# Patient Record
Sex: Female | Born: 1935 | Race: White | Hispanic: No | Smoking: Never smoker
Health system: Southern US, Community
[De-identification: ages and names within clinical notes are randomized; demographics above are authoritative.]

## PROBLEM LIST (undated history)

## (undated) DIAGNOSIS — E785 Hyperlipidemia, unspecified: Secondary | ICD-10-CM

## (undated) DIAGNOSIS — N186 End stage renal disease: Secondary | ICD-10-CM

## (undated) DIAGNOSIS — I10 Essential (primary) hypertension: Secondary | ICD-10-CM

## (undated) DIAGNOSIS — D649 Anemia, unspecified: Secondary | ICD-10-CM

## (undated) HISTORY — DX: Hyperlipidemia, unspecified: E78.5

## (undated) HISTORY — PX: BREAST SURGERY: SHX581

## (undated) HISTORY — PX: CHOLECYSTECTOMY: SHX55

## (undated) HISTORY — PX: TOTAL HIP ARTHROPLASTY: SHX124

## (undated) HISTORY — DX: Anemia, unspecified: D64.9

## (undated) HISTORY — DX: Essential (primary) hypertension: I10

## (undated) HISTORY — PX: APPENDECTOMY: SHX54

---

## 1998-05-01 ENCOUNTER — Encounter: Payer: Self-pay | Admitting: Internal Medicine

## 1998-05-01 ENCOUNTER — Ambulatory Visit (HOSPITAL_COMMUNITY): Admission: RE | Admit: 1998-05-01 | Discharge: 1998-05-01 | Payer: Self-pay | Admitting: Internal Medicine

## 1999-03-16 ENCOUNTER — Encounter: Admission: RE | Admit: 1999-03-16 | Discharge: 1999-03-16 | Payer: Self-pay | Admitting: Internal Medicine

## 1999-03-16 ENCOUNTER — Encounter: Payer: Self-pay | Admitting: Internal Medicine

## 1999-06-30 ENCOUNTER — Encounter: Payer: Self-pay | Admitting: Specialist

## 1999-06-30 ENCOUNTER — Ambulatory Visit (HOSPITAL_COMMUNITY): Admission: RE | Admit: 1999-06-30 | Discharge: 1999-06-30 | Payer: Self-pay | Admitting: Specialist

## 1999-07-14 ENCOUNTER — Encounter: Payer: Self-pay | Admitting: Specialist

## 1999-07-14 ENCOUNTER — Ambulatory Visit (HOSPITAL_COMMUNITY): Admission: RE | Admit: 1999-07-14 | Discharge: 1999-07-14 | Payer: Self-pay | Admitting: Specialist

## 1999-07-28 ENCOUNTER — Ambulatory Visit: Admission: RE | Admit: 1999-07-28 | Discharge: 1999-07-28 | Payer: Self-pay | Admitting: Specialist

## 1999-07-28 ENCOUNTER — Encounter: Payer: Self-pay | Admitting: Specialist

## 1999-09-01 ENCOUNTER — Other Ambulatory Visit: Admission: RE | Admit: 1999-09-01 | Discharge: 1999-09-01 | Payer: Self-pay | Admitting: Obstetrics and Gynecology

## 1999-09-09 ENCOUNTER — Encounter: Payer: Self-pay | Admitting: Specialist

## 1999-09-18 ENCOUNTER — Encounter: Payer: Self-pay | Admitting: Specialist

## 1999-09-18 ENCOUNTER — Inpatient Hospital Stay (HOSPITAL_COMMUNITY): Admission: RE | Admit: 1999-09-18 | Discharge: 1999-09-24 | Payer: Self-pay | Admitting: Specialist

## 2000-07-05 ENCOUNTER — Encounter: Payer: Self-pay | Admitting: Obstetrics and Gynecology

## 2000-07-05 ENCOUNTER — Encounter: Admission: RE | Admit: 2000-07-05 | Discharge: 2000-07-05 | Payer: Self-pay | Admitting: Obstetrics and Gynecology

## 2001-07-27 ENCOUNTER — Other Ambulatory Visit: Admission: RE | Admit: 2001-07-27 | Discharge: 2001-07-27 | Payer: Self-pay | Admitting: *Deleted

## 2002-07-05 ENCOUNTER — Encounter: Admission: RE | Admit: 2002-07-05 | Discharge: 2002-07-05 | Payer: Self-pay | Admitting: Internal Medicine

## 2002-07-05 ENCOUNTER — Encounter: Payer: Self-pay | Admitting: Internal Medicine

## 2002-10-03 ENCOUNTER — Other Ambulatory Visit: Admission: RE | Admit: 2002-10-03 | Discharge: 2002-10-03 | Payer: Self-pay | Admitting: Gynecology

## 2003-07-09 ENCOUNTER — Encounter: Admission: RE | Admit: 2003-07-09 | Discharge: 2003-07-09 | Payer: Self-pay | Admitting: Internal Medicine

## 2004-01-17 ENCOUNTER — Ambulatory Visit: Payer: Self-pay | Admitting: Internal Medicine

## 2004-12-25 ENCOUNTER — Ambulatory Visit: Payer: Self-pay | Admitting: Internal Medicine

## 2005-01-04 ENCOUNTER — Ambulatory Visit (HOSPITAL_COMMUNITY): Admission: RE | Admit: 2005-01-04 | Discharge: 2005-01-04 | Payer: Self-pay | Admitting: Internal Medicine

## 2005-05-17 ENCOUNTER — Ambulatory Visit: Payer: Self-pay | Admitting: Internal Medicine

## 2005-05-21 ENCOUNTER — Ambulatory Visit: Payer: Self-pay | Admitting: Internal Medicine

## 2005-05-26 ENCOUNTER — Ambulatory Visit: Payer: Self-pay | Admitting: Internal Medicine

## 2005-07-14 ENCOUNTER — Ambulatory Visit: Payer: Self-pay | Admitting: Internal Medicine

## 2005-07-30 ENCOUNTER — Ambulatory Visit: Payer: Self-pay | Admitting: Internal Medicine

## 2005-08-26 ENCOUNTER — Ambulatory Visit: Payer: Self-pay | Admitting: Internal Medicine

## 2005-09-07 ENCOUNTER — Ambulatory Visit: Payer: Self-pay | Admitting: Internal Medicine

## 2005-10-07 ENCOUNTER — Ambulatory Visit: Payer: Self-pay | Admitting: Internal Medicine

## 2005-10-28 ENCOUNTER — Ambulatory Visit: Payer: Self-pay | Admitting: Internal Medicine

## 2005-11-19 ENCOUNTER — Encounter: Payer: Self-pay | Admitting: Internal Medicine

## 2005-11-19 ENCOUNTER — Ambulatory Visit: Payer: Self-pay | Admitting: Internal Medicine

## 2005-11-19 DIAGNOSIS — E785 Hyperlipidemia, unspecified: Secondary | ICD-10-CM

## 2005-11-19 DIAGNOSIS — I10 Essential (primary) hypertension: Secondary | ICD-10-CM | POA: Insufficient documentation

## 2005-11-19 DIAGNOSIS — M109 Gout, unspecified: Secondary | ICD-10-CM | POA: Insufficient documentation

## 2005-11-19 LAB — CONVERTED CEMR LAB
Cholesterol, target level: 200 mg/dL
HDL goal, serum: 40 mg/dL
LDL Goal: 130 mg/dL

## 2005-12-20 ENCOUNTER — Ambulatory Visit: Payer: Self-pay | Admitting: Internal Medicine

## 2005-12-20 LAB — CONVERTED CEMR LAB
BUN: 13 mg/dL (ref 6–23)
CO2: 33 meq/L — ABNORMAL HIGH (ref 19–32)
Calcium: 9.5 mg/dL (ref 8.4–10.5)
Chloride: 107 meq/L (ref 96–112)
Chol/HDL Ratio, serum: 6.5
Cholesterol: 226 mg/dL (ref 0–200)
Creatinine, Ser: 1.1 mg/dL (ref 0.4–1.2)
GFR calc non Af Amer: 51 mL/min
Glomerular Filtration Rate, Af Am: 62 mL/min/{1.73_m2}
Glucose, Bld: 98 mg/dL (ref 70–99)
HDL: 34.8 mg/dL — ABNORMAL LOW (ref >39.0)
LDL DIRECT: 165.4 mg/dL
Potassium: 4.9 meq/L (ref 3.5–5.1)
Sodium: 144 meq/L (ref 135–145)
Triglyceride fasting, serum: 111 mg/dL (ref 0–149)
VLDL: 22 mg/dL (ref 0–40)

## 2005-12-27 ENCOUNTER — Ambulatory Visit: Payer: Self-pay | Admitting: Internal Medicine

## 2006-04-13 ENCOUNTER — Encounter: Admission: RE | Admit: 2006-04-13 | Discharge: 2006-04-13 | Payer: Self-pay | Admitting: Internal Medicine

## 2006-04-18 ENCOUNTER — Ambulatory Visit: Payer: Self-pay | Admitting: Internal Medicine

## 2006-04-18 LAB — CONVERTED CEMR LAB
ALT: 17 units/L (ref 0–40)
AST: 22 units/L (ref 0–37)
Albumin: 3 g/dL — ABNORMAL LOW (ref 3.5–5.2)
Alkaline Phosphatase: 76 units/L (ref 39–117)
BUN: 18 mg/dL (ref 6–23)
Basophils Absolute: 0 10*3/uL (ref 0.0–0.1)
Basophils Relative: 0.3 % (ref 0.0–1.0)
Bilirubin, Direct: 0.1 mg/dL (ref 0.0–0.3)
CO2: 33 meq/L — ABNORMAL HIGH (ref 19–32)
Calcium: 9.1 mg/dL (ref 8.4–10.5)
Chloride: 100 meq/L (ref 96–112)
Cholesterol: 207 mg/dL (ref 0–200)
Creatinine, Ser: 1 mg/dL (ref 0.4–1.2)
Direct LDL: 128.3 mg/dL
Eosinophils Absolute: 0.1 10*3/uL (ref 0.0–0.6)
Eosinophils Relative: 1.2 % (ref 0.0–5.0)
GFR calc Af Amer: 69 mL/min
GFR calc non Af Amer: 57 mL/min
Glucose, Bld: 98 mg/dL (ref 70–99)
HCT: 38.1 % (ref 36.0–46.0)
HDL: 29.8 mg/dL — ABNORMAL LOW (ref >39.0)
Hemoglobin: 13.1 g/dL (ref 12.0–15.0)
Lymphocytes Relative: 23.3 % (ref 12.0–46.0)
MCHC: 34.5 g/dL (ref 30.0–36.0)
MCV: 90.1 fL (ref 78.0–100.0)
Monocytes Absolute: 0.7 10*3/uL (ref 0.2–0.7)
Monocytes Relative: 6.2 % (ref 3.0–11.0)
Neutro Abs: 8.2 10*3/uL — ABNORMAL HIGH (ref 1.4–7.7)
Neutrophils Relative %: 69 % (ref 43.0–77.0)
Platelets: 311 10*3/uL (ref 150–400)
Potassium: 4.4 meq/L (ref 3.5–5.1)
RBC: 4.23 M/uL (ref 3.87–5.11)
RDW: 12.4 % (ref 11.5–14.6)
Sodium: 139 meq/L (ref 135–145)
Total Bilirubin: 0.6 mg/dL (ref 0.3–1.2)
Total CHOL/HDL Ratio: 6.9
Total Protein: 7.4 g/dL (ref 6.0–8.3)
Triglycerides: 288 mg/dL (ref 0–149)
VLDL: 58 mg/dL — ABNORMAL HIGH (ref 0–40)
WBC: 11.7 10*3/uL — ABNORMAL HIGH (ref 4.5–10.5)

## 2006-08-11 ENCOUNTER — Other Ambulatory Visit: Admission: RE | Admit: 2006-08-11 | Discharge: 2006-08-11 | Payer: Self-pay | Admitting: Obstetrics and Gynecology

## 2006-09-13 ENCOUNTER — Telehealth (INDEPENDENT_AMBULATORY_CARE_PROVIDER_SITE_OTHER): Payer: Self-pay | Admitting: *Deleted

## 2007-05-14 ENCOUNTER — Emergency Department (HOSPITAL_COMMUNITY): Admission: EM | Admit: 2007-05-14 | Discharge: 2007-05-15 | Payer: Self-pay | Admitting: Emergency Medicine

## 2007-07-11 ENCOUNTER — Ambulatory Visit: Payer: Self-pay | Admitting: Internal Medicine

## 2007-07-11 LAB — CONVERTED CEMR LAB
Bilirubin Urine: NEGATIVE
Glucose, Urine, Semiquant: NEGATIVE
Ketones, urine, test strip: NEGATIVE
Nitrite: POSITIVE
Specific Gravity, Urine: 1.025
Urobilinogen, UA: 0.2
pH: 6

## 2007-07-31 ENCOUNTER — Telehealth: Payer: Self-pay | Admitting: Internal Medicine

## 2007-09-05 ENCOUNTER — Ambulatory Visit: Payer: Self-pay | Admitting: Internal Medicine

## 2007-09-05 LAB — CONVERTED CEMR LAB
ALT: 15 units/L (ref 0–35)
AST: 20 units/L (ref 0–37)
Albumin: 3.4 g/dL — ABNORMAL LOW (ref 3.5–5.2)
Alkaline Phosphatase: 67 units/L (ref 39–117)
Bilirubin, Direct: 0.1 mg/dL (ref 0.0–0.3)
Cholesterol: 167 mg/dL (ref 0–200)
Direct LDL: 89.3 mg/dL
HDL: 28.1 mg/dL — ABNORMAL LOW (ref >39.0)
Total Bilirubin: 0.4 mg/dL (ref 0.3–1.2)
Total CHOL/HDL Ratio: 5.9
Total Protein: 6.8 g/dL (ref 6.0–8.3)
Triglycerides: 284 mg/dL (ref 0–149)
VLDL: 57 mg/dL — ABNORMAL HIGH (ref 0–40)

## 2007-09-12 ENCOUNTER — Ambulatory Visit: Payer: Self-pay | Admitting: Internal Medicine

## 2007-10-12 ENCOUNTER — Ambulatory Visit: Payer: Self-pay | Admitting: Internal Medicine

## 2008-01-09 ENCOUNTER — Ambulatory Visit: Payer: Self-pay | Admitting: Internal Medicine

## 2008-01-09 LAB — CONVERTED CEMR LAB
ALT: 11 units/L (ref 0–35)
AST: 18 units/L (ref 0–37)
Albumin: 3.3 g/dL — ABNORMAL LOW (ref 3.5–5.2)
Alkaline Phosphatase: 57 units/L (ref 39–117)
Bilirubin, Direct: 0.1 mg/dL (ref 0.0–0.3)
Cholesterol: 154 mg/dL (ref 0–200)
Direct LDL: 87.5 mg/dL
HDL: 26.3 mg/dL — ABNORMAL LOW (ref >39.0)
Total Bilirubin: 0.8 mg/dL (ref 0.3–1.2)
Total CHOL/HDL Ratio: 5.9
Total Protein: 6.7 g/dL (ref 6.0–8.3)
Triglycerides: 281 mg/dL (ref 0–149)
VLDL: 56 mg/dL — ABNORMAL HIGH (ref 0–40)

## 2008-01-26 ENCOUNTER — Ambulatory Visit: Payer: Self-pay | Admitting: Internal Medicine

## 2008-07-19 ENCOUNTER — Ambulatory Visit: Payer: Self-pay | Admitting: Internal Medicine

## 2008-07-19 LAB — CONVERTED CEMR LAB
ALT: 14 units/L (ref 0–35)
AST: 24 units/L (ref 0–37)
Albumin: 3.4 g/dL — ABNORMAL LOW (ref 3.5–5.2)
Alkaline Phosphatase: 62 units/L (ref 39–117)
BUN: 21 mg/dL (ref 6–23)
Bilirubin, Direct: 0 mg/dL (ref 0.0–0.3)
CO2: 29 meq/L (ref 19–32)
Calcium: 8.7 mg/dL (ref 8.4–10.5)
Chloride: 109 meq/L (ref 96–112)
Cholesterol: 149 mg/dL (ref 0–200)
Creatinine, Ser: 1.4 mg/dL — ABNORMAL HIGH (ref 0.4–1.2)
Direct LDL: 68.7 mg/dL
GFR calc non Af Amer: 38.32 mL/min (ref >60)
Glucose, Bld: 95 mg/dL (ref 70–99)
HDL: 32.3 mg/dL — ABNORMAL LOW (ref >39.00)
Potassium: 3.8 meq/L (ref 3.5–5.1)
Sodium: 140 meq/L (ref 135–145)
Total Bilirubin: 0.8 mg/dL (ref 0.3–1.2)
Total CHOL/HDL Ratio: 5
Total Protein: 7.4 g/dL (ref 6.0–8.3)
Triglycerides: 307 mg/dL — ABNORMAL HIGH (ref 0.0–149.0)
VLDL: 61.4 mg/dL — ABNORMAL HIGH (ref 0.0–40.0)

## 2008-07-29 ENCOUNTER — Ambulatory Visit: Payer: Self-pay | Admitting: Internal Medicine

## 2008-11-19 ENCOUNTER — Ambulatory Visit: Payer: Self-pay | Admitting: Internal Medicine

## 2008-11-19 LAB — CONVERTED CEMR LAB
ALT: 13 units/L (ref 0–35)
AST: 20 units/L (ref 0–37)
Albumin: 3.2 g/dL — ABNORMAL LOW (ref 3.5–5.2)
Alkaline Phosphatase: 57 units/L (ref 39–117)
BUN: 23 mg/dL (ref 6–23)
Bilirubin, Direct: 0.1 mg/dL (ref 0.0–0.3)
CO2: 26 meq/L (ref 19–32)
Calcium: 8.9 mg/dL (ref 8.4–10.5)
Chloride: 104 meq/L (ref 96–112)
Cholesterol: 151 mg/dL (ref 0–200)
Creatinine, Ser: 1.6 mg/dL — ABNORMAL HIGH (ref 0.4–1.2)
Direct LDL: 73 mg/dL
GFR calc non Af Amer: 32.82 mL/min (ref >60)
Glucose, Bld: 103 mg/dL — ABNORMAL HIGH (ref 70–99)
HDL: 30.5 mg/dL — ABNORMAL LOW (ref >39.00)
Potassium: 4.5 meq/L (ref 3.5–5.1)
Sodium: 140 meq/L (ref 135–145)
TSH: 2.5 microintl units/mL (ref 0.35–5.50)
Total Bilirubin: 0.6 mg/dL (ref 0.3–1.2)
Total CHOL/HDL Ratio: 5
Total Protein: 7.1 g/dL (ref 6.0–8.3)
Triglycerides: 250 mg/dL — ABNORMAL HIGH (ref 0.0–149.0)
VLDL: 50 mg/dL — ABNORMAL HIGH (ref 0.0–40.0)

## 2008-12-03 ENCOUNTER — Ambulatory Visit: Payer: Self-pay | Admitting: Internal Medicine

## 2009-01-09 ENCOUNTER — Ambulatory Visit: Payer: Self-pay | Admitting: Family Medicine

## 2009-01-09 ENCOUNTER — Ambulatory Visit: Payer: Self-pay | Admitting: Internal Medicine

## 2009-01-09 LAB — CONVERTED CEMR LAB
Bilirubin Urine: NEGATIVE
Glucose, Urine, Semiquant: NEGATIVE
Ketones, urine, test strip: NEGATIVE
Nitrite: NEGATIVE
Specific Gravity, Urine: 1.025
Urobilinogen, UA: 0.2
pH: 7

## 2009-01-10 ENCOUNTER — Encounter: Payer: Self-pay | Admitting: Family Medicine

## 2009-02-05 ENCOUNTER — Encounter: Admission: RE | Admit: 2009-02-05 | Discharge: 2009-02-05 | Payer: Self-pay | Admitting: Internal Medicine

## 2009-05-27 ENCOUNTER — Ambulatory Visit: Payer: Self-pay | Admitting: Internal Medicine

## 2009-05-27 LAB — CONVERTED CEMR LAB
ALT: 14 units/L (ref 0–35)
AST: 19 units/L (ref 0–37)
Albumin: 2.9 g/dL — ABNORMAL LOW (ref 3.5–5.2)
Alkaline Phosphatase: 56 units/L (ref 39–117)
BUN: 24 mg/dL — ABNORMAL HIGH (ref 6–23)
CO2: 27 meq/L (ref 19–32)
Calcium: 8.3 mg/dL — ABNORMAL LOW (ref 8.4–10.5)
Chloride: 108 meq/L (ref 96–112)
Cholesterol: 110 mg/dL (ref 0–200)
Creatinine, Ser: 1.9 mg/dL — ABNORMAL HIGH (ref 0.4–1.2)
Direct LDL: 48.6 mg/dL
GFR calc non Af Amer: 26.88 mL/min (ref >60)
Glucose, Bld: 94 mg/dL (ref 70–99)
HDL: 35.8 mg/dL — ABNORMAL LOW (ref >39.00)
Potassium: 3.8 meq/L (ref 3.5–5.1)
Sodium: 141 meq/L (ref 135–145)
Total Bilirubin: 0.3 mg/dL (ref 0.3–1.2)
Total CHOL/HDL Ratio: 3
Total Protein: 6.5 g/dL (ref 6.0–8.3)
Triglycerides: 227 mg/dL — ABNORMAL HIGH (ref 0.0–149.0)
VLDL: 45.4 mg/dL — ABNORMAL HIGH (ref 0.0–40.0)

## 2009-06-03 ENCOUNTER — Ambulatory Visit: Payer: Self-pay | Admitting: Internal Medicine

## 2009-06-03 DIAGNOSIS — F411 Generalized anxiety disorder: Secondary | ICD-10-CM | POA: Insufficient documentation

## 2009-11-03 ENCOUNTER — Telehealth: Payer: Self-pay | Admitting: Internal Medicine

## 2009-11-04 ENCOUNTER — Telehealth (INDEPENDENT_AMBULATORY_CARE_PROVIDER_SITE_OTHER): Payer: Self-pay | Admitting: *Deleted

## 2009-11-04 ENCOUNTER — Ambulatory Visit: Payer: Self-pay | Admitting: Family Medicine

## 2009-12-02 ENCOUNTER — Ambulatory Visit: Payer: Self-pay | Admitting: Internal Medicine

## 2009-12-04 LAB — CONVERTED CEMR LAB
ALT: 11 units/L (ref 0–35)
AST: 18 units/L (ref 0–37)
Albumin: 3.1 g/dL — ABNORMAL LOW (ref 3.5–5.2)
Alkaline Phosphatase: 62 units/L (ref 39–117)
BUN: 34 mg/dL — ABNORMAL HIGH (ref 6–23)
Bilirubin, Direct: 0 mg/dL (ref 0.0–0.3)
CO2: 28 meq/L (ref 19–32)
Calcium: 8.4 mg/dL (ref 8.4–10.5)
Chloride: 105 meq/L (ref 96–112)
Cholesterol: 161 mg/dL (ref 0–200)
Creatinine, Ser: 2.1 mg/dL — ABNORMAL HIGH (ref 0.4–1.2)
Direct LDL: 84 mg/dL
GFR calc non Af Amer: 23.92 mL/min (ref >60)
Glucose, Bld: 90 mg/dL (ref 70–99)
HDL: 30.6 mg/dL — ABNORMAL LOW (ref >39.00)
Potassium: 4.3 meq/L (ref 3.5–5.1)
Sodium: 137 meq/L (ref 135–145)
TSH: 3.5 microintl units/mL (ref 0.35–5.50)
Total Bilirubin: 0.3 mg/dL (ref 0.3–1.2)
Total CHOL/HDL Ratio: 5
Total Protein: 6.4 g/dL (ref 6.0–8.3)
Triglycerides: 258 mg/dL — ABNORMAL HIGH (ref 0.0–149.0)
VLDL: 51.6 mg/dL — ABNORMAL HIGH (ref 0.0–40.0)

## 2009-12-06 ENCOUNTER — Inpatient Hospital Stay (HOSPITAL_COMMUNITY): Admission: EM | Admit: 2009-12-06 | Discharge: 2009-12-06 | Payer: Self-pay | Admitting: Emergency Medicine

## 2010-03-02 ENCOUNTER — Encounter: Payer: Self-pay | Admitting: Internal Medicine

## 2010-03-02 ENCOUNTER — Other Ambulatory Visit: Payer: Self-pay | Admitting: Internal Medicine

## 2010-03-02 DIAGNOSIS — Z1231 Encounter for screening mammogram for malignant neoplasm of breast: Secondary | ICD-10-CM

## 2010-03-02 DIAGNOSIS — Z1239 Encounter for other screening for malignant neoplasm of breast: Secondary | ICD-10-CM

## 2010-03-10 NOTE — Progress Notes (Signed)
Summary: Fill Amoxocillin  Phone Note From Pharmacy   Summary of Call: Called Pharmacy back verfied with Dr. Clent Ridges to only fill Amoxcillin Initial call taken by: Kathrynn Speed CMA,  November 04, 2009 11:09 AM

## 2010-03-10 NOTE — Assessment & Plan Note (Signed)
Summary: 6 month rov/njr   Vital Signs:  Patient profile:   77 year old female Weight:      140 pounds Temp:     98.3 degrees F oral Pulse rate:   64 / minute BP sitting:   168 / 70  (left arm) Cuff size:   regular  Vitals Entered By: Alfred Levins, CMA (December 02, 2009 9:27 AM)  Serial Vital Signs/Assessments:  Time      Position  BP       Pulse  Resp  Temp     By                     130/80                         Birdie Sons MD  CC: f/u on bp   CC:  f/u on bp.  History of Present Illness:  Follow-Up Visit      This is an 78 year old woman who presents for Follow-up visit.  The patient denies chest pain and palpitations.  Since the last visit the patient notes no new problems or concerns.  The patient reports taking meds as prescribed.  When questioned about possible medication side effects, the patient notes none.  she does not monitor home BPs  Current Problems (verified): 1)  Anxiety State Nos  (ICD-300.00) 2)  Hypertension  (ICD-401.9) 3)  Hyperlipidemia  (ICD-272.4) 4)  Gout  (ICD-274.9)  Current Medications (verified): 1)  Norvasc 5 Mg Tabs (Amlodipine Besylate) .Marland Kitchen.. 1 By Mouth Qd 2)  Atenolol 25 Mg  Tabs (Atenolol) .... Take 1 Tablet By Mouth Two Times A Day 3)  Aspir-Low 81 Mg Tbec (Aspirin) .Marland Kitchen.. 1 By Mouth Qd 4)  Alprazolam 0.25 Mg Tabs (Alprazolam) .... Take 1 Tablet By Mouth Once A Day As Needed Anxiety 5)  Crestor 20 Mg Tabs (Rosuvastatin Calcium) .... Take 1 Tablet By Mouth Once A Day or As Directed 6)  Centrum Silver  Tabs (Multiple Vitamins-Minerals) .... Once Daily  Allergies (verified): No Known Drug Allergies  Physical Exam  General:  alert and well-developed.   Head:  normocephalic and atraumatic.   Eyes:  pupils equal and pupils round.   Ears:  R ear normal and L ear normal.   Neck:  several small tender anterior nodes below the left ear  Lungs:  Normal respiratory effort, chest expands symmetrically. Lungs are clear to auscultation, no  crackles or wheezes. Heart:  normal rate and regular rhythm.   Abdomen:  soft and non-tender.   Msk:  No deformity or scoliosis noted of thoracic or lumbar spine.   Neurologic:  cranial nerves II-XII intact and gait normal.   Skin:  turgor normal and color normal.   Psych:  good eye contact and not anxious appearing.     Impression & Recommendations:  Problem # 1:  HYPERTENSION (ICD-401.9)  repeat bp 130/80 she does comment about night cramps  will try magnesium oxide Her updated medication list for this problem includes:    Norvasc 5 Mg Tabs (Amlodipine besylate) .Marland Kitchen... 1 by mouth qd    Atenolol 25 Mg Tabs (Atenolol) .Marland Kitchen... Take 1 tablet by mouth two times a day  Orders: Venipuncture (16109) TLB-BMP (Basic Metabolic Panel-BMET) (80048-METABOL)  Problem # 2:  HYPERLIPIDEMIA (ICD-272.4)  check labs today Her updated medication list for this problem includes:    Crestor 20 Mg Tabs (Rosuvastatin calcium) .Marland Kitchen... Take 1 tablet by mouth  once a day or as directed  Labs Reviewed: SGOT: 19 (05/27/2009)   SGPT: 14 (05/27/2009)  Lipid Goals: Chol Goal: 200 (11/19/2005)   HDL Goal: 40 (11/19/2005)   LDL Goal: 130 (11/19/2005)   TG Goal: 150 (11/19/2005)  Prior 10 Yr Risk Heart Disease: Not enough information (11/19/2005)   HDL:35.80 (05/27/2009), 30.50 (11/19/2008)  LDL:DEL (01/09/2008), DEL (09/05/2007)  Chol:110 (05/27/2009), 151 (11/19/2008)  Trig:227.0 (05/27/2009), 250.0 (11/19/2008)  Orders: TLB-Lipid Panel (80061-LIPID) TLB-Hepatic/Liver Function Pnl (80076-HEPATIC) TLB-TSH (Thyroid Stimulating Hormone) (84443-TSH)  Problem # 3:  ANXIETY STATE NOS (ICD-300.00) ok to use xanax i'd like her to use less but she states "I have to take one daily" Her updated medication list for this problem includes:    Alprazolam 0.25 Mg Tabs (Alprazolam) .Marland Kitchen... Take 1 tablet by mouth once a day as needed anxiety  Complete Medication List: 1)  Norvasc 5 Mg Tabs (Amlodipine besylate) .Marland Kitchen.. 1 by  mouth qd 2)  Atenolol 25 Mg Tabs (Atenolol) .... Take 1 tablet by mouth two times a day 3)  Aspir-low 81 Mg Tbec (Aspirin) .Marland Kitchen.. 1 by mouth qd 4)  Alprazolam 0.25 Mg Tabs (Alprazolam) .... Take 1 tablet by mouth once a day as needed anxiety 5)  Crestor 20 Mg Tabs (Rosuvastatin calcium) .... Take 1 tablet by mouth once a day or as directed 6)  Centrum Silver Tabs (Multiple vitamins-minerals) .... Once daily 7)  Magnesium Oxide 400 Mg Tabs (Magnesium oxide) .Marland Kitchen.. 1 by mouth once daily ---may help with leg cramps Prescriptions: NORVASC 5 MG TABS (AMLODIPINE BESYLATE) 1 by mouth qd  #90 x 3   Entered and Authorized by:   Birdie Sons MD   Signed by:   Birdie Sons MD on 12/02/2009   Method used:   Electronically to        [facility] [phone]* (retail)       [address]       [place], [place]  [address]       Ph: [phone] or [phone]       Fax: [phone]   RxID:   [id number]    Orders Added: 1)  Est. Patient Level IV [25956] 2)  Venipuncture [38756] 3)  TLB-BMP (Basic Metabolic Panel-BMET) [80048-METABOL] 4)  TLB-Lipid Panel [80061-LIPID] 5)  TLB-Hepatic/Liver Function Pnl [80076-HEPATIC] 6)  TLB-TSH (Thyroid Stimulating Hormone) [43329-JJO]  Appended Document: Orders Update     Clinical Lists Changes  Orders: Added new Service order of Specimen Handling (84166) - Signed

## 2010-03-10 NOTE — Progress Notes (Signed)
Summary: REQ FOR RX  Phone Note Call from Patient   Caller: Patient Summary of Call: Pt called in to adv that she has had an earache x 3 days.... Pt believes that she has an ear infection and would like to have Rx (abx) sent into [facility] [place]...Marland KitchenMarland KitchenPt was offered OV with physician today but declined req that Rx be sent into pharmacy for abx.... Pt can be reached at [phone].  Initial call taken by: Debbra Riding,  November 03, 2009 9:11 AM  Follow-up for Phone Call        probably needs OV Follow-up by: Birdie Sons MD,  November 03, 2009 12:03 PM  Additional Follow-up for Phone Call Additional follow up Details #1::        Pt is going to come in for OV tomorrow with Dr Clent Ridges (Dr Cato Mulligan schedule full).  Additional Follow-up by: Debbra Riding,  November 03, 2009 3:18 PM

## 2010-03-10 NOTE — Assessment & Plan Note (Signed)
Summary: EARACHE // RS   Vital Signs:  Patient profile:   77 year old female Weight:      143 pounds Temp:     98.6 degrees F oral BP sitting:   170 / 62  (left arm) Cuff size:   regular  Vitals Entered By: Kathrynn Speed CMA (November 04, 2009 9:21 AM) CC: Earache x 3 days, src Is Patient Diabetic? No   History of Present Illness: Here for 3 days of pain in the left ear and the left neck beneath the ear. No HA or fever or ST.   Preventive Screening-Counseling & Management  Alcohol-Tobacco     Smoking Status: never  Current Medications (verified): 1)  Norvasc 5 Mg Tabs (Amlodipine Besylate) .Marland Kitchen.. 1 By Mouth Qd 2)  Atenolol 25 Mg  Tabs (Atenolol) .... Take 1 Tablet By Mouth Two Times A Day 3)  Aspir-Low 81 Mg Tbec (Aspirin) .Marland Kitchen.. 1 By Mouth Qd 4)  Vitamin D3 1000 Unit Tabs (Cholecalciferol) .... Once Daily 5)  Alprazolam 0.25 Mg Tabs (Alprazolam) .... Take 1 Tablet By Mouth Once A Day As Needed Anxiety 6)  Co Q-10 300 Mg  Caps (Coenzyme Q10) .... Once Daily--Takes Sometimes 7)  Crestor 20 Mg Tabs (Rosuvastatin Calcium) .... Take 1 Tablet By Mouth Once A Day or As Directed 8)  Eq Pain Reliever Ex St 500 Mg Caps (Acetaminophen) .... As Needed  Allergies (verified): No Known Drug Allergies  Past History:  Past Medical History: Reviewed history from 11/19/2005 and no changes required. Gout Hyperlipidemia Hypertension  Past Surgical History: Reviewed history from 11/19/2005 and no changes required. Appendectomy Cholecystectomy Total hip replacement benign breast biopsy  Review of Systems  The patient denies anorexia, fever, weight loss, weight gain, vision loss, decreased hearing, hoarseness, chest pain, syncope, dyspnea on exertion, peripheral edema, prolonged cough, headaches, hemoptysis, abdominal pain, melena, hematochezia, severe indigestion/heartburn, hematuria, incontinence, genital sores, muscle weakness, suspicious skin lesions, transient blindness, difficulty  walking, depression, unusual weight change, abnormal bleeding, enlarged lymph nodes, angioedema, breast masses, and testicular masses.    Physical Exam  General:  Well-developed,well-nourished,in no acute distress; alert,appropriate and cooperative throughout examination Head:  Normocephalic and atraumatic without obvious abnormalities. No apparent alopecia or balding. Eyes:  No corneal or conjunctival inflammation noted. EOMI. Perrla. Funduscopic exam benign, without hemorrhages, exudates or papilledema. Vision grossly normal. Ears:  External ear exam shows no significant lesions or deformities.  Otoscopic examination reveals clear canals, tympanic membranes are intact bilaterally without bulging, retraction, inflammation or discharge. Hearing is grossly normal bilaterally. She is tender at the bottom of the left earlobe.  Nose:  External nasal examination shows no deformity or inflammation. Nasal mucosa are pink and moist without lesions or exudates. Mouth:  Oral mucosa and oropharynx without lesions or exudates.  Teeth in good repair. Neck:  several small tender anterior nodes below the left ear  Lungs:  Normal respiratory effort, chest expands symmetrically. Lungs are clear to auscultation, no crackles or wheezes.   Impression & Recommendations:  Problem # 1:  CELLULITIS, FACE (ICD-682.0)  The following medications were removed from the medication list:    Augmentin 875-125 Mg Tabs (Amoxicillin-pot clavulanate) .Marland Kitchen..Marland Kitchen Two times a day Her updated medication list for this problem includes:    Amoxicillin 875 Mg Tabs (Amoxicillin) .Marland Kitchen..Marland Kitchen Two times a day  Complete Medication List: 1)  Norvasc 5 Mg Tabs (Amlodipine besylate) .Marland Kitchen.. 1 by mouth qd 2)  Atenolol 25 Mg Tabs (Atenolol) .... Take 1 tablet by mouth two times  a day 3)  Aspir-low 81 Mg Tbec (Aspirin) .Marland Kitchen.. 1 by mouth qd 4)  Vitamin D3 1000 Unit Tabs (Cholecalciferol) .... Once daily 5)  Alprazolam 0.25 Mg Tabs (Alprazolam) .... Take 1  tablet by mouth once a day as needed anxiety 6)  Co Q-10 300 Mg Caps (Coenzyme q10) .... Once daily--takes sometimes 7)  Crestor 20 Mg Tabs (Rosuvastatin calcium) .... Take 1 tablet by mouth once a day or as directed 8)  Eq Pain Reliever Ex St 500 Mg Caps (Acetaminophen) .... As needed 9)  Amoxicillin 875 Mg Tabs (Amoxicillin) .... Two times a day  Patient Instructions: 1)  Use warm compresses as needed . Stop wearing earrings for 2 weeks  Prescriptions: AMOXICILLIN 875 MG TABS (AMOXICILLIN) two times a day  #20 x 0   Entered and Authorized by:   Nelwyn Salisbury MD   Signed by:   Nelwyn Salisbury MD on 11/04/2009   Method used:   Electronically to        [facility] [phone]* (retail)       [address]       [place], [place]  [address]       Ph: [phone] or [phone]       Fax: [phone]   RxID:   [id number] AUGMENTIN 875-125 MG TABS (AMOXICILLIN-POT CLAVULANATE) two times a day  #20 x 0   Entered and Authorized by:   Nelwyn Salisbury MD   Signed by:   Nelwyn Salisbury MD on 11/04/2009   Method used:   Electronically to        [facility] [phone]* (retail)       [address]       [place], [place]  [address]       Ph: [phone] or [phone]       Fax: [phone]   RxID:   [id number]

## 2010-03-10 NOTE — Assessment & Plan Note (Signed)
Summary: ROA X 6 MTHS / RS   Vital Signs:  Patient profile:   77 year old female Weight:      144 pounds BMI:     22.64 Temp:     98.8 degrees F oral Pulse rate:   62 / minute Pulse rhythm:   regular Resp:     12 per minute BP sitting:   146 / 68  (left arm) Cuff size:   regular  Vitals Entered By: Gladis Riffle, RN (June 03, 2009 9:14 AM) CC: 6 month rov, labs done Is Patient Diabetic? No   CC:  6 month rov and labs done.  History of Present Illness:  Follow-Up Visit      This is an 77 year old woman who presents for Follow-up visit.  The patient denies chest pain and palpitations.  Since the last visit the patient notes no new problems or concerns.  The patient reports taking meds as prescribed.  When questioned about possible medication side effects, the patient notes none.    All other systems reviewed and were negative   Preventive Screening-Counseling & Management  Alcohol-Tobacco     Smoking Status: never  Current Problems (verified): 1)  Hypertension  (ICD-401.9) 2)  Hyperlipidemia  (ICD-272.4) 3)  Gout  (ICD-274.9)  Current Medications (verified): 1)  Norvasc 5 Mg Tabs (Amlodipine Besylate) .Marland Kitchen.. 1 By Mouth Qd 2)  Atenolol 25 Mg  Tabs (Atenolol) .... Take 1 Tablet By Mouth Two Times A Day 3)  Aspir-Low 81 Mg Tbec (Aspirin) .Marland Kitchen.. 1 By Mouth Qd 4)  Vitamin D3 1000 Unit Tabs (Cholecalciferol) .... Once Daily 5)  Alprazolam 0.25 Mg Tabs (Alprazolam) .... Take One Tablet By Mouth Every Other Day As Needed 6)  Co Q-10 300 Mg  Caps (Coenzyme Q10) .... Once Daily--Takes Sometimes 7)  Crestor 20 Mg Tabs (Rosuvastatin Calcium) .... Take 1 Tablet By Mouth Once A Day or As Directed 8)  Eq Pain Reliever Ex St 500 Mg Caps (Acetaminophen) .... As Needed  Allergies (verified): No Known Drug Allergies  Past History:  Past Medical History: Last updated: 12-17-05 Gout Hyperlipidemia Hypertension  Past Surgical History: Last updated:  2005/12/17 Appendectomy Cholecystectomy Total hip replacement benign breast biopsy  Family History: Last updated: Dec 17, 2005 father deceased MI age  Family History of CAD Female 1st degree relative <50 his health is really unknown  Social History: Last updated: 07/29/2008 Married x 2---widowed  (2010) Never Smoked Alcohol use-no  Risk Factors: Smoking Status: never (06/03/2009)  Physical Exam  General:  Well-developed,well-nourished,in no acute distress; alert,appropriate and cooperative throughout examination Head:  normocephalic and atraumatic.   Eyes:  pupils equal and pupils round.   Ears:  R ear normal and L ear normal.   Neck:  No deformities, masses, or tenderness noted. Chest Wall:  No deformities, masses, or tenderness noted. Lungs:  normal respiratory effort and no intercostal retractions.   Heart:  normal rate and regular rhythm.   Abdomen:  Bowel sounds positive,abdomen soft and non-tender without masses, organomegaly or hernias noted. Msk:  No deformity or scoliosis noted of thoracic or lumbar spine.   Neurologic:  cranial nerves II-XII intact and gait normal.     Impression & Recommendations:  Problem # 1:  HYPERLIPIDEMIA (ICD-272.4)  complains of cost note results---decrease to 1/2 dose (she will cut 20s in 1/2) Her updated medication list for this problem includes:    Crestor 20 Mg Tabs (Rosuvastatin calcium) .Marland Kitchen... Take 1 tablet by mouth once a day or as  directed  Labs Reviewed: SGOT: 19 (05/27/2009)   SGPT: 14 (05/27/2009)  Lipid Goals: Chol Goal: 200 (11/19/2005)   HDL Goal: 40 (11/19/2005)   LDL Goal: 130 (11/19/2005)   TG Goal: 150 (11/19/2005)  Prior 10 Yr Risk Heart Disease: Not enough information (11/19/2005)   HDL:35.80 (05/27/2009), 30.50 (11/19/2008)  LDL:DEL (01/09/2008), DEL (09/05/2007)  Chol:110 (05/27/2009), 151 (11/19/2008)  Trig:227.0 (05/27/2009), 250.0 (11/19/2008)  Problem # 2:  HYPERTENSION (ICD-401.9) reasonable control she  will start monitoring at home---call me if bp > 140/80 Her updated medication list for this problem includes:    Norvasc 5 Mg Tabs (Amlodipine besylate) .Marland Kitchen... 1 by mouth qd    Atenolol 25 Mg Tabs (Atenolol) .Marland Kitchen... Take 1 tablet by mouth two times a day  BP today: 146/68 Prior BP: 156/68 (01/09/2009)  Prior 10 Yr Risk Heart Disease: Not enough information (11/19/2005)  Labs Reviewed: K+: 3.8 (05/27/2009) Creat: : 1.9 (05/27/2009)   Chol: 110 (05/27/2009)   HDL: 35.80 (05/27/2009)   LDL: DEL (01/09/2008)   TG: 227.0 (05/27/2009)  Problem # 3:  GOUT (ICD-274.9) no recurrence  Problem # 4:  ANXIETY STATE NOS (ICD-300.00) she takes one every day side effects discussed Her updated medication list for this problem includes:    Alprazolam 0.25 Mg Tabs (Alprazolam) .Marland Kitchen... Take 1 tablet by mouth once a day as needed anxiety  Complete Medication List: 1)  Norvasc 5 Mg Tabs (Amlodipine besylate) .Marland Kitchen.. 1 by mouth qd 2)  Atenolol 25 Mg Tabs (Atenolol) .... Take 1 tablet by mouth two times a day 3)  Aspir-low 81 Mg Tbec (Aspirin) .Marland Kitchen.. 1 by mouth qd 4)  Vitamin D3 1000 Unit Tabs (Cholecalciferol) .... Once daily 5)  Alprazolam 0.25 Mg Tabs (Alprazolam) .... Take 1 tablet by mouth once a day as needed anxiety 6)  Co Q-10 300 Mg Caps (Coenzyme q10) .... Once daily--takes sometimes 7)  Crestor 20 Mg Tabs (Rosuvastatin calcium) .... Take 1 tablet by mouth once a day or as directed 8)  Eq Pain Reliever Ex St 500 Mg Caps (Acetaminophen) .... As needed  Patient Instructions: 1)  Please schedule a follow-up appointment in 6 months. Prescriptions: ALPRAZOLAM 0.25 MG TABS (ALPRAZOLAM) Take 1 tablet by mouth once a day as needed anxiety  #90 x 1   Entered and Authorized by:   Birdie Sons MD   Signed by:   Birdie Sons MD on 06/03/2009   Method used:   Print then Give to Patient   RxID:   [id number]      Immunization History:  Tetanus/Td Immunization History:    Tetanus/Td:  declines  (06/03/2009)  Pneumovax Immunization History:    Pneumovax:  declines (06/03/2009)

## 2010-03-18 ENCOUNTER — Ambulatory Visit
Admission: RE | Admit: 2010-03-18 | Discharge: 2010-03-18 | Disposition: A | Discharge status: Home / Self Care | Payer: MEDICARE | Source: Ambulatory Visit | Attending: Internal Medicine | Admitting: Internal Medicine

## 2010-03-18 DIAGNOSIS — Z1231 Encounter for screening mammogram for malignant neoplasm of breast: Secondary | ICD-10-CM

## 2010-03-25 ENCOUNTER — Telehealth: Payer: Self-pay | Admitting: Internal Medicine

## 2010-03-25 DIAGNOSIS — F419 Anxiety disorder, unspecified: Secondary | ICD-10-CM

## 2010-03-25 MED ORDER — ALPRAZOLAM 0.25 MG PO TABS: 0.2500 mg | ORAL_TABLET | Freq: Every day | ORAL | Status: DC | PRN

## 2010-03-25 NOTE — Telephone Encounter (Signed)
Request from pharmacy for refill of xanax, rx faxed in

## 2010-04-22 LAB — BASIC METABOLIC PANEL
BUN: 25 mg/dL — ABNORMAL HIGH (ref 6–23)
CO2: 23 mEq/L (ref 19–32)
Calcium: 8.4 mg/dL (ref 8.4–10.5)
Chloride: 106 mEq/L (ref 96–112)
Creatinine, Ser: 2.09 mg/dL — ABNORMAL HIGH (ref 0.4–1.2)
GFR calc Af Amer: 27 mL/min — ABNORMAL LOW (ref >60)
GFR calc non Af Amer: 23 mL/min — ABNORMAL LOW (ref >60)
Glucose, Bld: 135 mg/dL — ABNORMAL HIGH (ref 70–99)
Potassium: 4 mEq/L (ref 3.5–5.1)
Sodium: 138 mEq/L (ref 135–145)

## 2010-04-22 LAB — DIFFERENTIAL
Basophils Absolute: 0 10*3/uL (ref 0.0–0.1)
Basophils Relative: 0 % (ref 0–1)
Eosinophils Absolute: 0.1 10*3/uL (ref 0.0–0.7)
Eosinophils Relative: 1 % (ref 0–5)
Lymphocytes Relative: 16 % (ref 12–46)
Lymphs Abs: 2.2 10*3/uL (ref 0.7–4.0)
Monocytes Absolute: 0.4 10*3/uL (ref 0.1–1.0)
Monocytes Relative: 3 % (ref 3–12)
Neutro Abs: 11 10*3/uL — ABNORMAL HIGH (ref 1.7–7.7)
Neutrophils Relative %: 80 % — ABNORMAL HIGH (ref 43–77)

## 2010-04-22 LAB — CBC
HCT: 29.3 % — ABNORMAL LOW (ref 36.0–46.0)
Hemoglobin: 9.9 g/dL — ABNORMAL LOW (ref 12.0–15.0)
MCH: 30.7 pg (ref 26.0–34.0)
MCHC: 33.8 g/dL (ref 30.0–36.0)
MCV: 90.7 fL (ref 78.0–100.0)
Platelets: 282 10*3/uL (ref 150–400)
RBC: 3.23 MIL/uL — ABNORMAL LOW (ref 3.87–5.11)
RDW: 12.6 % (ref 11.5–15.5)
WBC: 13.8 10*3/uL — ABNORMAL HIGH (ref 4.0–10.5)

## 2010-05-19 ENCOUNTER — Other Ambulatory Visit: Payer: Self-pay | Admitting: Internal Medicine

## 2010-05-28 ENCOUNTER — Encounter: Payer: Self-pay | Admitting: Internal Medicine

## 2010-05-29 ENCOUNTER — Ambulatory Visit (INDEPENDENT_AMBULATORY_CARE_PROVIDER_SITE_OTHER): Payer: MEDICARE | Admitting: Internal Medicine

## 2010-05-29 ENCOUNTER — Encounter: Payer: Self-pay | Admitting: Internal Medicine

## 2010-05-29 VITALS — BP 188/80 | HR 80 | Temp 98.9°F | Wt 139.0 lb

## 2010-05-29 DIAGNOSIS — E785 Hyperlipidemia, unspecified: Secondary | ICD-10-CM

## 2010-05-29 DIAGNOSIS — F411 Generalized anxiety disorder: Secondary | ICD-10-CM

## 2010-05-29 DIAGNOSIS — I10 Essential (primary) hypertension: Secondary | ICD-10-CM

## 2010-05-29 LAB — TSH: TSH: 3.72 u[IU]/mL (ref 0.35–5.50)

## 2010-05-29 LAB — LIPID PANEL
HDL: 35.2 mg/dL — ABNORMAL LOW (ref >39.00)
Triglycerides: 157 mg/dL — ABNORMAL HIGH (ref 0.0–149.0)
VLDL: 31.4 mg/dL (ref 0.0–40.0)

## 2010-05-29 LAB — HEPATIC FUNCTION PANEL
ALT: 12 U/L (ref 0–35)
AST: 16 U/L (ref 0–37)
Total Bilirubin: 0.4 mg/dL (ref 0.3–1.2)
Total Protein: 6.3 g/dL (ref 6.0–8.3)

## 2010-05-29 LAB — BASIC METABOLIC PANEL
BUN: 34 mg/dL — ABNORMAL HIGH (ref 6–23)
CO2: 25 mEq/L (ref 19–32)
Chloride: 107 mEq/L (ref 96–112)
Creatinine, Ser: 3.1 mg/dL — ABNORMAL HIGH (ref 0.4–1.2)
Glucose, Bld: 82 mg/dL (ref 70–99)

## 2010-05-29 MED ORDER — ATENOLOL 50 MG PO TABS: 50.0000 mg | ORAL_TABLET | Freq: Two times a day (BID) | ORAL | Status: DC

## 2010-05-29 MED ORDER — AMLODIPINE BESYLATE 10 MG PO TABS: 10.0000 mg | ORAL_TABLET | Freq: Every day | ORAL | Status: DC

## 2010-05-29 MED ORDER — ATENOLOL 25 MG PO TABS: 25.0000 mg | ORAL_TABLET | Freq: Two times a day (BID) | ORAL | Status: DC

## 2010-05-29 NOTE — Assessment & Plan Note (Signed)
Not controlled See new mediction list---will increase meds Side effects discussed

## 2010-05-29 NOTE — Progress Notes (Signed)
  Subjective:    Patient ID: Christine Graham, female    DOB: 1925-03-27, 77 y.o.   MRN: [id number]  HPI  HTN---tolerating meds without difficulty- no home BPs. No known complications of meds or htn.  Lipids---needs f/u  She reports nightly leg cramping and wonders whether due to crestor  Anxiety: she uses xanax daily  Past Medical History  Diagnosis Date  . Hyperlipidemia   . Hypertension   . Gout    Past Surgical History  Procedure Date  . Appendectomy   . Cholecystectomy   . Total hip arthroplasty   . Breast surgery     benign breast biopsy    reports that she has never smoked. She does not have any smokeless tobacco history on file. She reports that she does not drink alcohol or use illicit drugs. family history includes Arthritis in her mother and Heart disease in her father. No Known Allergies  Current Outpatient Prescriptions  Medication Sig Dispense Refill  . ALPRAZolam (XANAX) 0.25 MG tablet Take 1 tablet (0.25 mg total) by mouth daily as needed for Anxiety.  30 tablet  3  . amLODipine (NORVASC) 5 MG tablet Take 5 mg by mouth daily.        Marland Kitchen aspirin 81 MG tablet Take 81 mg by mouth daily.        Marland Kitchen atenolol (TENORMIN) 25 MG tablet Take 25 mg by mouth daily.        . CRESTOR 20 MG tablet TAKE ONE TABLET BY MOUTH ONE TIME DAILY OR AS DIRECTED  30 each  5  . magnesium oxide (MAG-OX) 400 MG tablet Take 400 mg by mouth daily.        . Multiple Vitamin (MULTIVITAMIN) tablet Take 1 tablet by mouth daily.           Review of Systems  patient denies chest pain, shortness of breath, orthopnea. Denies lower extremity edema, abdominal pain, change in appetite, change in bowel movements. Patient denies rashes, musculoskeletal complaints. No other specific complaints in a complete review of systems.      Objective:   Physical Exam  Well-developed well-nourished female in no acute distress. HEENT exam atraumatic, normocephalic, extraocular muscles are intact. Neck is supple. No  jugular venous distention no thyromegaly. Chest clear to auscultation without increased work of breathing. Cardiac exam S1 and S2 are regular. Abdominal exam active bowel sounds, soft, nontender. Extremities no edema. Neurologic exam she is alert without any motor sensory deficits. Gait is normal.        Assessment & Plan:

## 2010-05-29 NOTE — Patient Instructions (Signed)
Stop crestor for 6 weeks and I'll see you back then

## 2010-05-29 NOTE — Assessment & Plan Note (Signed)
Chronic problem Ok to continue currrent meds

## 2010-05-29 NOTE — Assessment & Plan Note (Signed)
Needs f/u labs Check today  Will stop crestor for 6 weeks---see if contributing to leg cramps

## 2010-06-02 ENCOUNTER — Other Ambulatory Visit: Payer: Self-pay | Admitting: Internal Medicine

## 2010-06-02 ENCOUNTER — Other Ambulatory Visit: Payer: MEDICARE

## 2010-06-02 DIAGNOSIS — N179 Acute kidney failure, unspecified: Secondary | ICD-10-CM

## 2010-06-05 ENCOUNTER — Ambulatory Visit
Admission: RE | Admit: 2010-06-05 | Discharge: 2010-06-05 | Disposition: A | Discharge status: Home / Self Care | Payer: MEDICARE | Source: Ambulatory Visit | Attending: Internal Medicine | Admitting: Internal Medicine

## 2010-06-05 DIAGNOSIS — N179 Acute kidney failure, unspecified: Secondary | ICD-10-CM

## 2010-06-26 NOTE — Discharge Summary (Signed)
[facility]  Patient:    Christine Graham, Christine Graham                    MRN: [id number] Adm. Date:  [id number] Disc. Date: [id number] Attending:  Pierce Crane Dictator:   Druscilla Brownie. Shela Nevin, P.A.                           Discharge Summary  ADDENDUM FROM DICTATION OF September 22, 1999, #[id number]  The patient was to have been discharged on September 22, 1999, to [name]; however, a bed was not available.  We continued to work with the patient in the hospital therapy with ambulation, ADLs, and problem-solving.  She was also instructions on total hip protocol of her right hip.  The patient was very apprehensive going home.  She lives totally alone and, since rehabilitation was not available, we physical therapy worked diligently with her.  When she achieved her goals in total hip protocol, it was felt she could be maintained at home.  The patient continued to be quite apprehensive, as she had nobody to care for her.  We assured her that home health would be available.  She was able to contact her daughter, and her daughter will come and pick her up the evening of discharge.  DISCHARGE PLAN:  Return to the center two weeks after the date of surgery. Use dry dressing p.r.n.  MEDICATIONS: 1. Continue Coumadin per pharmacy protocol. 2. Robaxin 500 mg #30 1 q.6h. p.r.n. muscle spasm. 3. Vicodin #50 1-2 q.4-6h. p.r.n. pain.  DISCHARGE INSTRUCTIONS:  Call if any problems. DD:  09/24/99 TD:  09/26/99 Job: [id number]

## 2010-06-26 NOTE — Op Note (Signed)
[facility]  Patient:    Christine Graham, Christine Graham                    MRN: [id number] Proc. Date: 09/18/99 Adm. Date:  [id number] Attending:  Pierce Crane                           Operative Report  PREOPERATIVE DIAGNOSIS:  Degenerative joint disease of right hip.  POSTOPERATIVE DIAGNOSIS:  Degenerative joint disease of right hip.  PROCEDURE PERFORMED:  Right total hip arthroplasty.  ANESTHESIA:  General.  SURGEON:  Javier Docker, M.D.  ASSISTANT:  Philips J. Montez Morita, M.D.  INDICATIONS:  This is a 77 year old female with refractory hip pain secondary to osteoarthritis and operative intervention is indicated for replacement of the degenerated joint.  Risks and benefits were discussed including bleeding, infection, damage to vascular structures, and DVT, leg length discrepancy, hardware failure, etc.  DESCRIPTION OF PROCEDURE:  The patient was placed in the supine position after satisfactory adequate general anesthesia and 1 g of Kefzol IV for antimicrobial prophylaxis.  The patient was placed in the left lateral decubitus position and all bony prominences well padded.  Hip positioner was utilized.  A curvilinear incision based around the trochanter was made.  The skin and subcutaneous tissue was dissected away and electrocautery utilized to achieve hemostasis.  The fascia lata was identified and divided in line with the skin incision.  A Charnley retractor was placed and soft tissue protected. The hip internally rotated.  Adductor tenotomy performed.  External rotators were then identified, tagged, and reflected posteriorly to protect the sciatic nerve at all times.  A T-shape capsulotomy was then performed and joint fluid expressed.  Severe arthrosis was noted.  An oscillating saw was utilized to perform an osteotomy one fingerbreadth below the lesser trochanter.  The head was then removed.  With appropriate version, the femur was sequentially  reamed with 13.5 mm diameter after utilizing was initiated and lateralization. Excellent purchase obtained.  Acetabular retractors were then placed. Osteophytes were removed from the rim of the acetabulum as was the labrum. The capsule was noted to be extremely atretic.  Therefore, it was excised.  Next, in the appropriate version, the acetabulum was sequentially reamed to a 51 mm diameter after the appropriate templating.  The trial acetabulum was then impacted into the acetabulum with appropriate version.  The trial +5 length and head was placed on the femoral broach.  The hip was found to be reduced and found to be stable throughout its full range, and felt that the +8 would be optimal.  The hip was dislocated and all trials were then removed. The acetabulum was inspected and reaming was performed through good cancellous bone to the base of the fovea.  It was reinspected and residual osteophytes are noted.  The acetabulum was copiously irrigated.  Examination of the femur and inspection of the canal revealed no perforation of the femur.  Next, in the appropriate anteversion, a Prodigy 13.5 mm fully porous coated prosthesis was impacted in the appropriate version and excellent scratch fit obtained.  The acetabulum was placed prior to that in the appropriate version. This was then impacted again with a porous-coated Duraloc 100. Excellent purchase was obtained and the appropriate version.  A permanent liner was impacted in the appropriate version as well.  The trial +8 head was placed on the prosthesis.  The hip was reduced and found to be stable  throughout with full range of motion in extension.  There was no dislocation anteriorly and flexion and internal rotation with no dislocation posteriorly.  Next, the trial head was then removed, trunnion cleaned, and the +8 head impacted on to the neck.  Excellent purchase was noted.  Next, the wound was copiously irrigated.  Electrocautery  was utilized to achieve hemostasis.  The adductor tenotomy was repaired with #1 Vicryl interrupted figure-of-eight sutures.  The external rotators were repaired with the periosteum with #1 Vicryl interrupted figure-of-eight sutures.  The fascia lata was reapproximated with #1 Vicryl interrupted figure-of-eight sutures. The subcutaneous tissue was reapproximated with 2-0 Vicryl simple sutures. The skin was reapproximated with staples.  The wound was dressed sterilely.  The patient was placed supine on the hospital bed.  An abduction pillow was placed.  Leg lengths were equivalent.  Extubated without difficulty and transported to the recovery room in satisfactory condition.  The patient tolerated the procedure well with no complications.  Estimated blood loss was 200 cc. DD:  09/18/99 TD:  09/20/99 Job: [id number]

## 2010-06-26 NOTE — Discharge Summary (Signed)
[facility]  Patient:    Christine Graham, Christine Graham                    MRN: [id number] Adm. Date:  [id number] Disc. Date: 09/22/99 Attending:  Pierce Crane Dictator:   Druscilla Brownie. Shela Nevin, P.A. CCValetta Mole. Swords, M.D. [facility]                           Discharge Summary  ADMITTING DIAGNOSES: 1. End stage osteoarthritis right hip. 2. Lumbar spondylosis. 3. Hypertension 4. Osteoarthritis. 5. Anxiety.  DISCHARGE DIAGNOSES: 1. End stage osteoarthritis right hip. 2. Lumbar spondylosis. 3. Hypertension 4. Osteoarthritis. 5. Anxiety. 6. Mild postoperative anemia (treated).  OPERATIONS:  September 18, 1999, the patient underwent right total hip replacement arthroplasty ______ assisted.  DISCUSSION:  The patient did well postoperatively. She was very slow in beginniNG her physical therapy.  She suffered a drop in her hemoglobin which went down to 8.8 and we transfused her and it came up to 10.3.  She also had some night fevers, and was started with incentive spirometry and they eventually went away.  on the day of discharge her vital signs are stable. Neurovascular intact in the operative extremity.  My hip dressing was dry.  Dr. Johna Roles saw the patient for relocation evaluation at [facility] as this patient is a very active lady but lives alone and her limited weightbearing status of 50% to her right lower extremity (partial weightbearing) put her in need of rehabilitation.  Dr. Laurier Nancy, thought that she would be an excellent candidate for this program and arrangements were made for her to be transferred when bed was available.  LABORATORY DATA:  Laboratory values in the hospital hematologically showed a CBC with differential which was essentially within normal limits on admission. She had a slight elevated white count.  As mentioned above final hemoglobin was 10.3 with hematocrit of 31.2.  Blood chemistries were within  normal limits.  Urinalysis showed a chronic urinary tract infection and this was treated.  Electrocardiogram showed normal sinus rhythm and the chest x-ray showed negative reactive disease.  CONDITION ON DISCHARGE:  Improved and stable.  PLAN:  The patient is transferred to [facility] with intensive rehabilitation program.  She really ought to remain touchdown weightbearing 50% to 75% on the right lower extremity.  Use dry dressing p.r.n.Marland Kitchen  Remove staples in 2 weeks postoperative and Steri-Strip p.r.n.  Continue with home medication per her family doctor.  She should continue on treatment of her urinary tract infection.  We will have her Cipro 500 b.i.d.  At the end of this treatment urinalysis should be checked.  Her Vicodin will be continued for pain control and Robaxin as a muscle relaxant.  FOLLOWUP:  Return to the center after leaving rehabilitation. DD:  09/22/99 TD:  09/22/99 Job: [id number]

## 2010-06-26 NOTE — H&P (Signed)
[facility]  Patient:    Christine Graham, Christine Graham                        MRN: [id number] Attending:  Javier Docker, M.D. Dictator:   Ralene Bathe, P.A. CC:         Valetta Mole. Swords, M.D. [facility]                         History and Physical  DATE OF BIRTH:  May 09, 1925  CHIEF COMPLAINT:  Right hip pain.  HISTORY OF PRESENT ILLNESS:  This is a 77 year old white female who has been followed in our practice for persistent pain right lumbar and groin as well as right knee pain.  The patient has had a thorough workup complete including EMG, nerve conductions, MRIs of her lumbar spine as well as x-rays of her hip and knee.  She has also undergone epidural steroids of her lumbar spine and persists with right groin and thigh pain.  X-rays have been significant for end-stage osteoarthritis of the right hip that has been refractory to conservative measurements, and it is felt most of her symptoms are stemming from her back even though she has known lumbar spondylosis.  She also has some moderate arthritis in her right knee which has responded to corticosteroid injections.  On x-ray she has end-stage osteoarthritis of her right knee with exquisite pain with any rotational movements of the hip.  At this time, it is felt she has failed conservative measures and total joint arthroplasty is indicated to the right hip.  Hopefully, this will relieve some of her radicular type symptoms as it is felt this is more from referred pain.  Risks, benefits of scheduled procedure were discussed at length with patient and she is in agreement to proceed.  She did not donate autologous blood for this procedure.  PAST MEDICAL HISTORY: 1. Hypertension. 2. Osteoarthritis. 3. Anxiety.  PAST SURGICAL HISTORY: 1. Cholecystectomy. 2. Appendectomy. 3. Lumpectomy.  SOCIAL HISTORY:  The patient lives alone in a one-story home.  No alcohol and no tobacco use.  Did not donate autologous  blood.  Primary physician is Dr. Cato Mulligan who gave clearance.  MEDICATIONS: 1. Toprol XL 50 mg q.d. 2. Norvasc 5 mg q.d. 3. Senokot daily. 4. Metamucil daily.  ALLERGIES:  No known drug allergies.  FAMILY HISTORY:  Negative for cancer, diabetes or heart disease.  REVIEW OF SYSTEMS:  The patient denies any recent fevers, chills, night sweats, no bleeding tendencies.  CNS:  No blurred or double vision, seizures or paralysis.  RESPIRATORY:  No shortness of breath, productive cough or hemoptysis.  CARDIOVASCULAR:  No chest pain, angina or orthopnea.  GI:  No nausea, vomiting, constipation, melena stools.  GENITOURINARY:  No dysuria or hematuria.  MUSCULOSKELETAL:  As pertinent to present illness.  PHYSICAL EXAMINATION:  GENERAL:  A well-developed, well-nourished, 77 year old female.  VITAL SIGNS:  Blood pressure 142/68, respirations 12, pulse 72.  HEENT:  Normocephalic.  Extraocular motions intact.  NECK:  Supple, no lymphadenopathy.  No carotid bruits appreciated on exam.  CHEST:  Clear to auscultation bilaterally, no rales or rhonchi.  HEART:  Regular rate and rhythm, no murmur, gallops, rubs, heaves or thrills.  ABDOMEN:  Positive bowel sounds, soft, nontender.  EXTREMITIES:  There is no perceived pitting edema.  Positive distal pulses are noted.  She is neurovascularly intact to bilateral lower extremities.  IMPRESSION: 1. End-stage osteoarthritis right  hip. 2. Lumbar spondylosis. 3. Hypertension. 4. Osteoarthritis. 5. Anxiety.  PLAN:  The patient is being admitted for right total hip arthroplasty.  She will require postoperative rehabilitation consult.  She did not donate autologous blood. DD:  09/15/99 TD:  09/15/99 Job: [id number]

## 2010-07-10 ENCOUNTER — Ambulatory Visit (INDEPENDENT_AMBULATORY_CARE_PROVIDER_SITE_OTHER): Payer: Medicare Other | Admitting: Internal Medicine

## 2010-07-10 ENCOUNTER — Encounter: Payer: Self-pay | Admitting: Internal Medicine

## 2010-07-10 DIAGNOSIS — I129 Hypertensive chronic kidney disease with stage 1 through stage 4 chronic kidney disease, or unspecified chronic kidney disease: Secondary | ICD-10-CM

## 2010-07-10 DIAGNOSIS — F411 Generalized anxiety disorder: Secondary | ICD-10-CM

## 2010-07-10 DIAGNOSIS — E785 Hyperlipidemia, unspecified: Secondary | ICD-10-CM

## 2010-07-10 DIAGNOSIS — I10 Essential (primary) hypertension: Secondary | ICD-10-CM

## 2010-07-10 LAB — HEPATIC FUNCTION PANEL
Albumin: 3.3 g/dL — ABNORMAL LOW (ref 3.5–5.2)
Alkaline Phosphatase: 58 U/L (ref 39–117)
Bilirubin, Direct: 0 mg/dL (ref 0.0–0.3)
Total Protein: 6.7 g/dL (ref 6.0–8.3)

## 2010-07-10 LAB — BASIC METABOLIC PANEL
CO2: 22 mEq/L (ref 19–32)
Calcium: 8.5 mg/dL (ref 8.4–10.5)
Creatinine, Ser: 3.4 mg/dL — ABNORMAL HIGH (ref 0.4–1.2)
GFR: 13.56 mL/min — CL (ref >60.00)
Sodium: 136 mEq/L (ref 135–145)

## 2010-07-10 LAB — CBC WITH DIFFERENTIAL/PLATELET
Basophils Absolute: 0.1 10*3/uL (ref 0.0–0.1)
Eosinophils Absolute: 0.3 10*3/uL (ref 0.0–0.7)
HCT: 26.6 % — ABNORMAL LOW (ref 36.0–46.0)
Hemoglobin: 9.3 g/dL — ABNORMAL LOW (ref 12.0–15.0)
Lymphs Abs: 2.7 10*3/uL (ref 0.7–4.0)
MCHC: 35 g/dL (ref 30.0–36.0)
Neutro Abs: 6.2 10*3/uL (ref 1.4–7.7)
RDW: 13.9 % (ref 11.5–14.6)

## 2010-07-10 NOTE — Assessment & Plan Note (Signed)
Needs further change in meds Side effects discussed Decrease amlodipine to 5 mg Add losartan

## 2010-07-10 NOTE — Progress Notes (Signed)
  Subjective:    Patient ID: Christine Graham, female    DOB: 06-16-1925, 77 y.o.   MRN: [id number]  HPI  htn---tlerating meds but has noted some LE edema---resolves overnight. She thinks she has noted some fatigue with higher dose of amlodipine  SOB---she has noted some SOB when she walks to the mail box. No chest pain  Lipids: not taking any meds  Past Medical History  Diagnosis Date  . Hyperlipidemia   . Hypertension   . Gout    Past Surgical History  Procedure Date  . Appendectomy   . Cholecystectomy   . Total hip arthroplasty   . Breast surgery     benign breast biopsy    reports that she has never smoked. She does not have any smokeless tobacco history on file. She reports that she does not drink alcohol or use illicit drugs. family history includes Arthritis in her mother and Heart disease in her father. No Known Allergies   Review of Systems  patient denies chest pain, shortness of breath, orthopnea. Denies lower extremity edema, abdominal pain, change in appetite, change in bowel movements. Patient denies rashes, musculoskeletal complaints. No other specific complaints in a complete review of systems.      Objective:   Physical Exam  Well-developed well-nourished female in no acute distress. HEENT exam atraumatic, normocephalic, extraocular muscles are intact. Neck is supple. No jugular venous distention no thyromegaly. Chest clear to auscultation without increased work of breathing. Cardiac exam S1 and S2 are regular. Abdominal exam active bowel sounds, soft, nontender. Extremities no edema. Neurologic exam she is alert without any motor sensory deficits. Gait is normal.        Assessment & Plan:

## 2010-07-14 ENCOUNTER — Other Ambulatory Visit: Payer: Self-pay | Admitting: Internal Medicine

## 2010-07-14 DIAGNOSIS — N19 Unspecified kidney failure: Secondary | ICD-10-CM

## 2010-07-14 NOTE — Assessment & Plan Note (Signed)
Has not required treatment due to elevated HDL.

## 2010-07-14 NOTE — Assessment & Plan Note (Signed)
She uses alprazolam. I don't think she overuses it. We'll continue current therapy.

## 2010-07-28 ENCOUNTER — Ambulatory Visit: Payer: Medicare Other | Admitting: Internal Medicine

## 2010-07-29 ENCOUNTER — Ambulatory Visit: Payer: Medicare Other | Admitting: Internal Medicine

## 2010-08-06 ENCOUNTER — Encounter: Payer: Self-pay | Admitting: Internal Medicine

## 2010-08-20 ENCOUNTER — Encounter: Payer: Self-pay | Admitting: Internal Medicine

## 2010-08-20 ENCOUNTER — Ambulatory Visit (INDEPENDENT_AMBULATORY_CARE_PROVIDER_SITE_OTHER): Payer: Medicare Other | Admitting: Internal Medicine

## 2010-08-20 DIAGNOSIS — I129 Hypertensive chronic kidney disease with stage 1 through stage 4 chronic kidney disease, or unspecified chronic kidney disease: Secondary | ICD-10-CM

## 2010-08-20 DIAGNOSIS — I10 Essential (primary) hypertension: Secondary | ICD-10-CM

## 2010-08-20 DIAGNOSIS — E785 Hyperlipidemia, unspecified: Secondary | ICD-10-CM

## 2010-08-20 DIAGNOSIS — L309 Dermatitis, unspecified: Secondary | ICD-10-CM

## 2010-08-20 DIAGNOSIS — L259 Unspecified contact dermatitis, unspecified cause: Secondary | ICD-10-CM

## 2010-08-20 NOTE — Patient Instructions (Signed)
Benadryl 25 mg every 6 hours as needed for itching Continue hydrocortisone cream Discontinue antibiotic cream  Hold Lasix until your rash has resolved  Call or return to clinic prn if these symptoms worsen or fail to improve as anticipated.

## 2010-08-20 NOTE — Progress Notes (Signed)
  Subjective:    Patient ID: Christine Graham, female    DOB: 1926-01-05, 77 y.o.   MRN: [id number]  HPI  77 year old patient who presents with a chief complaint of rash involving her arms and legs;  this has been present for a couple weeks and seems to be improving but is still quite pruritic.  She states that this is very similar to a contact dermatitis she had a number of years ago but there has been no obvious exposure. She has recently been placed on furosemide but otherwise no new medications. Her chief complaint is pruritus. She has been using topical hydrocortisone as well as topic antibiotic cream. No fever or systemic complaints;  rash has not been progressive Review of Systems  Skin: Positive for rash.       Objective:   Physical Exam  Constitutional: She appears well-developed and well-nourished. No distress.       Blood pressure 120/70  Skin: Rash noted.       Mild erythematous slightly papular rash with scattered excoriations over her distal arms and legs. There were no fresh lesions and the rash appeared to be resolving          Assessment & Plan:   Dermatitis. Appears to be most consistent with a contact dermatitis although there is no obvious exposure. Doubt adverse drug effect but well but the furosemide on hold for now and rechallenged once the rash has resolved. She has been asked to continue the hydrocortisone cream but discontinued the antibiotic cream. She'll use Benadryl when necessary

## 2010-09-16 ENCOUNTER — Encounter: Payer: Self-pay | Admitting: Internal Medicine

## 2010-09-16 ENCOUNTER — Telehealth: Payer: Self-pay | Admitting: Internal Medicine

## 2010-09-16 ENCOUNTER — Ambulatory Visit (INDEPENDENT_AMBULATORY_CARE_PROVIDER_SITE_OTHER): Payer: Medicare Other | Admitting: Internal Medicine

## 2010-09-16 VITALS — BP 172/68 | Temp 98.1°F | Wt 138.0 lb

## 2010-09-16 DIAGNOSIS — H9209 Otalgia, unspecified ear: Secondary | ICD-10-CM

## 2010-09-16 DIAGNOSIS — I10 Essential (primary) hypertension: Secondary | ICD-10-CM

## 2010-09-16 MED ORDER — LISINOPRIL 10 MG PO TABS: 10.0000 mg | ORAL_TABLET | Freq: Every day | ORAL | Status: DC

## 2010-09-16 MED ORDER — CIPROFLOXACIN-HYDROCORTISONE 0.2-1 % OT SUSP: 4.0000 [drp] | Freq: Two times a day (BID) | OTIC | Status: AC

## 2010-09-16 NOTE — Progress Notes (Signed)
  Subjective:    Patient ID: Christine Graham, female    DOB: 1925/03/06, 77 y.o.   MRN: [id number]  HPI  Earache for 3 days. Intermittent shooting pains No fever or chills No decreased hearing  Past Medical History  Diagnosis Date  . Hyperlipidemia   . Hypertension   . Gout    Past Surgical History  Procedure Date  . Appendectomy   . Cholecystectomy   . Total hip arthroplasty   . Breast surgery     benign breast biopsy    reports that she has never smoked. She does not have any smokeless tobacco history on file. She reports that she does not drink alcohol or use illicit drugs. family history includes Arthritis in her mother and Heart disease in her father. No Known Allergies   Review of Systems  patient denies chest pain, shortness of breath, orthopnea. Denies lower extremity edema, abdominal pain, change in appetite, change in bowel movements. Patient denies rashes, musculoskeletal complaints. No other specific complaints in a complete review of systems.  She states she is compliant with meds    Objective:   Physical Exam   Well-developed well-nourished female in no acute distress. HEENT exam atraumatic, normocephalic, extraocular muscles are intact. Neck is supple. No jugular venous distention no thyromegaly. Chest clear to auscultation without increased work of breathing. Cardiac exam S1 and S2 are regular. Abdominal exam active bowel sounds, soft, nontender. Extremities no edema.      Assessment & Plan:  Otalgia- I'll treat as external otitis

## 2010-09-16 NOTE — Patient Instructions (Signed)
Your blood pressure is elevated. Add lisinopril 10 mg daily to your medications

## 2010-09-16 NOTE — Telephone Encounter (Signed)
Pharmacy called was filling prescription for ciprofloxacin-hydrocortisone (CIPRO HC) otic suspension Pt. Co-pay will be $92.00. Pharmacy wanted to know if they could fill with a generic version so the co-pay would not be as high. Please contact pharmacy

## 2010-09-16 NOTE — Assessment & Plan Note (Signed)
Lab Results  Component Value Date   NA 136 07/10/2010   K 5.3* 07/10/2010   CL 106 07/10/2010   CO2 22 07/10/2010   BUN 45* 07/10/2010   CREATININE 3.4* 07/10/2010    BP Readings from Last 3 Encounters:  09/16/10 172/68  08/20/10 120/70  07/10/10 166/64    Assessment: Hypertension control:  severely elevated  Progress toward goals:  deteriorated Barriers to meeting goals:  no barriers identified  Plan: Hypertension treatment:  continue current medications and add additional meds Trial lisinopril

## 2010-09-16 NOTE — Telephone Encounter (Signed)
Called pharmacy and ok'd the generic

## 2010-09-24 ENCOUNTER — Other Ambulatory Visit: Payer: Self-pay | Admitting: *Deleted

## 2010-09-24 DIAGNOSIS — F419 Anxiety disorder, unspecified: Secondary | ICD-10-CM

## 2010-09-24 MED ORDER — ALPRAZOLAM 0.25 MG PO TABS: 0.2500 mg | ORAL_TABLET | Freq: Every day | ORAL | Status: DC | PRN

## 2010-09-29 ENCOUNTER — Telehealth: Payer: Self-pay | Admitting: Internal Medicine

## 2010-09-29 NOTE — Telephone Encounter (Signed)
Pt is coming in on 10/16/10 at 9:45.  She will go to a local pharmacy and record BP readings

## 2010-09-29 NOTE — Telephone Encounter (Signed)
Pt is worried about her blood pressure and doesn't want to wait to Oct to see him. Pt was hoping to get in asap.

## 2010-09-30 ENCOUNTER — Telehealth: Payer: Self-pay

## 2010-09-30 NOTE — Telephone Encounter (Signed)
Pt states that she did not get her lisinopril Rx from the pharmacy. Call made to pharm to verify receipt of Rx.  Rx is ready for pt to pick up, per pharmacist and pt aware

## 2010-10-02 ENCOUNTER — Other Ambulatory Visit: Payer: Self-pay | Admitting: Nephrology

## 2010-10-02 ENCOUNTER — Ambulatory Visit (HOSPITAL_COMMUNITY): Payer: Medicare Other | Attending: Nephrology

## 2010-10-02 DIAGNOSIS — D631 Anemia in chronic kidney disease: Secondary | ICD-10-CM | POA: Insufficient documentation

## 2010-10-02 DIAGNOSIS — N185 Chronic kidney disease, stage 5: Secondary | ICD-10-CM | POA: Insufficient documentation

## 2010-10-02 LAB — COMPREHENSIVE METABOLIC PANEL
Albumin: 3.3 g/dL — ABNORMAL LOW (ref 3.5–5.2)
Alkaline Phosphatase: 72 U/L (ref 39–117)
BUN: 55 mg/dL — ABNORMAL HIGH (ref 6–23)
CO2: 21 mEq/L (ref 19–32)
Chloride: 103 mEq/L (ref 96–112)
GFR calc non Af Amer: 10 mL/min — ABNORMAL LOW (ref >60)
Potassium: 4.7 mEq/L (ref 3.5–5.1)
Total Bilirubin: 0.2 mg/dL — ABNORMAL LOW (ref 0.3–1.2)

## 2010-10-02 LAB — IRON AND TIBC: Iron: 43 ug/dL (ref 42–135)

## 2010-10-06 ENCOUNTER — Ambulatory Visit (INDEPENDENT_AMBULATORY_CARE_PROVIDER_SITE_OTHER): Payer: Medicare Other

## 2010-10-06 ENCOUNTER — Encounter: Payer: Self-pay | Admitting: Vascular Surgery

## 2010-10-06 DIAGNOSIS — N189 Chronic kidney disease, unspecified: Secondary | ICD-10-CM

## 2010-10-06 DIAGNOSIS — Z01810 Encounter for preprocedural cardiovascular examination: Secondary | ICD-10-CM

## 2010-10-07 ENCOUNTER — Ambulatory Visit (INDEPENDENT_AMBULATORY_CARE_PROVIDER_SITE_OTHER): Payer: Medicare Other | Admitting: Vascular Surgery

## 2010-10-07 ENCOUNTER — Encounter: Payer: Self-pay | Admitting: Vascular Surgery

## 2010-10-07 VITALS — BP 177/55 | HR 84 | Resp 16 | Ht 67.0 in | Wt 143.0 lb

## 2010-10-07 DIAGNOSIS — N186 End stage renal disease: Secondary | ICD-10-CM

## 2010-10-07 NOTE — Progress Notes (Signed)
The patient is an 77 year old female with stage V kidney disease. She is here today for evaluation for AV access past history significant for hypertension hyperlipidemia history of right total hip arthroplasty history of appendectomy. She is having progressive fatigue and some tertiary swelling. She also has anemia associated with the renal failure.  Review of systems: Gen. fatigue. Hematologic anemia. GU renal failure. Musculoskeletal arthritis and joint pain. All of the systems negative.  Past Medical History  Diagnosis Date  . Hyperlipidemia   . Hypertension   . Gout   . Chronic kidney disease     History  Substance Use Topics  . Smoking status: Never Smoker   . Smokeless tobacco: Not on file  . Alcohol Use: No    Family History  Problem Relation Age of Onset  . Heart disease Father     MI  . Arthritis Mother     No Known Allergies  Current outpatient prescriptions:ALPRAZolam (XANAX) 0.25 MG tablet, Take 1 tablet (0.25 mg total) by mouth daily as needed for anxiety., Disp: 30 tablet, Rfl: 3;  amLODipine (NORVASC) 10 MG tablet, Take 1 tablet (10 mg total) by mouth daily., Disp: 90 tablet, Rfl: 3;  aspirin 81 MG tablet, Take 81 mg by mouth daily.  , Disp: , Rfl:  atenolol (TENORMIN) 50 MG tablet, Take 1 tablet (50 mg total) by mouth 2 (two) times daily., Disp: 180 tablet, Rfl: 3;  furosemide (LASIX) 20 MG tablet, Take 20 mg by mouth daily.  , Disp: , Rfl: ;  lisinopril (PRINIVIL,ZESTRIL) 10 MG tablet, Take 1 tablet (10 mg total) by mouth daily., Disp: 90 tablet, Rfl: 3;  Multiple Vitamin (MULTIVITAMIN) tablet, Take 1 tablet by mouth daily.  , Disp: , Rfl:  rosuvastatin (CRESTOR) 20 MG tablet, Take 20 mg by mouth daily.  , Disp: , Rfl: ;  ergocalciferol (VITAMIN D2) 50000 UNITS capsule, Take 50,000 Units by mouth once a week.  , Disp: , Rfl:   BP 177/55  Pulse 84  Resp 16  Ht 5\' 7"  (1.702 m)  Wt 143 lb (64.864 kg)  BMI 22.40 kg/m2  Body mass index is 22.40  kg/(m^2).       Physical exam a well-developed well-nourished white female in no acute distress. HEENT normal. On her breathing is nonlabored. 2+ radial pulses bilaterally. Abdomen soft nontender. Left skeletal no major deformities. Neurologic no focal weakness or paresthesias. Skin without ulcers or rashes. She does have a very small surface veins bilaterally.  Upper extremity vein mapping: Very small cephalic and basilic veins throughout both upper extremities.  Impression: Advanced renal insufficiency with need for hemodialysis access.  Plan: I discussed options for renal access with the patient and her family. I discussed dialysis catheters, AV fistulas, and AV Gore-Tex graft. She is not a candidate for fistula related to her small veins. She is left-handed. I have recommended placement of a right Gore-Tex graft we'll schedule this as an outpatient for 12/16/2010. She understands the need for ongoing maintenance of access to include graft clotting and treatment of this.

## 2010-10-09 ENCOUNTER — Encounter (HOSPITAL_COMMUNITY): Payer: Medicare Other

## 2010-10-09 ENCOUNTER — Other Ambulatory Visit: Payer: Self-pay | Admitting: Nephrology

## 2010-10-09 ENCOUNTER — Ambulatory Visit (HOSPITAL_COMMUNITY): Payer: Medicare Other | Attending: Nephrology

## 2010-10-09 DIAGNOSIS — D631 Anemia in chronic kidney disease: Secondary | ICD-10-CM | POA: Insufficient documentation

## 2010-10-09 DIAGNOSIS — N185 Chronic kidney disease, stage 5: Secondary | ICD-10-CM | POA: Insufficient documentation

## 2010-10-13 LAB — POCT HEMOGLOBIN-HEMACUE: Hemoglobin: 9 g/dL — ABNORMAL LOW (ref 12.0–15.0)

## 2010-10-13 NOTE — Procedures (Unsigned)
CEPHALIC VEIN MAPPING  INDICATION:  Preoperative vein mapping for AVF placement.  HISTORY: Chronic kidney disease, stage V.  EXAM: The right cephalic vein is compressible.  Diameter measurements range from 0.18 to 0.11 cm.  The right basilic vein is compressible.  Diameter measurements range from 0.25  to 0.14 cm.  The left cephalic vein is compressible.  Diameter measurements range from 0.21 to 0.10 cm.  The left basilic vein is compressible.  Diameter measurements range from 0.19 to 0.14 cm.  See attached worksheet for all measurements.  IMPRESSION:  Patent right and left cephalic and basilic veins with diameter measurements noted on the following worksheet.  ___________________________________________ Larina Earthly, M.D.  EM/MEDQ  D:  10/06/2010  T:  10/06/2010  Job:  [id number]

## 2010-10-15 ENCOUNTER — Other Ambulatory Visit: Payer: Self-pay | Admitting: Nephrology

## 2010-10-15 ENCOUNTER — Encounter (HOSPITAL_COMMUNITY)
Admission: RE | Admit: 2010-10-15 | Discharge: 2010-10-15 | Disposition: A | Discharge status: Home / Self Care | Payer: Medicare Other | Source: Ambulatory Visit | Attending: Nephrology | Admitting: Nephrology

## 2010-10-15 ENCOUNTER — Ambulatory Visit (HOSPITAL_COMMUNITY)
Admission: RE | Admit: 2010-10-15 | Discharge: 2010-10-15 | Disposition: A | Discharge status: Home / Self Care | Payer: Medicare Other | Source: Ambulatory Visit | Attending: Vascular Surgery | Admitting: Vascular Surgery

## 2010-10-15 ENCOUNTER — Other Ambulatory Visit: Payer: Self-pay | Admitting: Internal Medicine

## 2010-10-15 ENCOUNTER — Other Ambulatory Visit: Payer: Self-pay | Admitting: Vascular Surgery

## 2010-10-15 ENCOUNTER — Encounter (HOSPITAL_COMMUNITY)
Admission: RE | Admit: 2010-10-15 | Discharge: 2010-10-15 | Disposition: A | Discharge status: Home / Self Care | Payer: Medicare Other | Source: Ambulatory Visit | Attending: Vascular Surgery | Admitting: Vascular Surgery

## 2010-10-15 DIAGNOSIS — N185 Chronic kidney disease, stage 5: Secondary | ICD-10-CM | POA: Insufficient documentation

## 2010-10-15 DIAGNOSIS — N186 End stage renal disease: Secondary | ICD-10-CM

## 2010-10-15 DIAGNOSIS — D631 Anemia in chronic kidney disease: Secondary | ICD-10-CM | POA: Insufficient documentation

## 2010-10-15 DIAGNOSIS — N039 Chronic nephritic syndrome with unspecified morphologic changes: Secondary | ICD-10-CM | POA: Insufficient documentation

## 2010-10-15 LAB — RENAL FUNCTION PANEL
Albumin: 3.5 g/dL (ref 3.5–5.2)
Calcium: 9.4 mg/dL (ref 8.4–10.5)
GFR calc Af Amer: 11 mL/min — ABNORMAL LOW (ref >60)
GFR calc non Af Amer: 9 mL/min — ABNORMAL LOW (ref >60)
Glucose, Bld: 106 mg/dL — ABNORMAL HIGH (ref 70–99)
Phosphorus: 6.3 mg/dL — ABNORMAL HIGH (ref 2.3–4.6)
Potassium: 5 mEq/L (ref 3.5–5.1)
Sodium: 136 mEq/L (ref 135–145)

## 2010-10-15 LAB — FERRITIN: Ferritin: 686 ng/mL — ABNORMAL HIGH (ref 10–291)

## 2010-10-15 LAB — SURGICAL PCR SCREEN: Staphylococcus aureus: NEGATIVE

## 2010-10-15 LAB — IRON AND TIBC: TIBC: 350 ug/dL (ref 250–470)

## 2010-10-15 LAB — MAGNESIUM: Magnesium: 2.6 mg/dL — ABNORMAL HIGH (ref 1.5–2.5)

## 2010-10-16 ENCOUNTER — Ambulatory Visit (HOSPITAL_COMMUNITY)
Admission: RE | Admit: 2010-10-16 | Discharge: 2010-10-16 | Disposition: A | Discharge status: Home / Self Care | Payer: Medicare Other | Source: Ambulatory Visit | Attending: Vascular Surgery | Admitting: Vascular Surgery

## 2010-10-16 ENCOUNTER — Ambulatory Visit: Payer: Medicare Other | Admitting: Internal Medicine

## 2010-10-16 DIAGNOSIS — Z01810 Encounter for preprocedural cardiovascular examination: Secondary | ICD-10-CM | POA: Insufficient documentation

## 2010-10-16 DIAGNOSIS — Z01812 Encounter for preprocedural laboratory examination: Secondary | ICD-10-CM | POA: Insufficient documentation

## 2010-10-16 DIAGNOSIS — I129 Hypertensive chronic kidney disease with stage 1 through stage 4 chronic kidney disease, or unspecified chronic kidney disease: Secondary | ICD-10-CM | POA: Insufficient documentation

## 2010-10-16 DIAGNOSIS — N189 Chronic kidney disease, unspecified: Secondary | ICD-10-CM | POA: Insufficient documentation

## 2010-10-16 DIAGNOSIS — N186 End stage renal disease: Secondary | ICD-10-CM

## 2010-10-16 DIAGNOSIS — Z01818 Encounter for other preprocedural examination: Secondary | ICD-10-CM | POA: Insufficient documentation

## 2010-10-16 HISTORY — PX: AV FISTULA PLACEMENT: SHX1204

## 2010-10-16 LAB — POCT I-STAT 4, (NA,K, GLUC, HGB,HCT)
Potassium: 4.3 mEq/L (ref 3.5–5.1)
Sodium: 137 mEq/L (ref 135–145)

## 2010-10-16 LAB — APTT: aPTT: 29 seconds (ref 24–37)

## 2010-10-16 LAB — PROTIME-INR
INR: 1.07 (ref 0.00–1.49)
Prothrombin Time: 14.1 seconds (ref 11.6–15.2)

## 2010-10-22 ENCOUNTER — Encounter (HOSPITAL_COMMUNITY): Payer: Medicare Other

## 2010-10-27 NOTE — Op Note (Signed)
  NAMEMarland Kitchen  LYRIS, HITCHMAN NO.:  [id number]  MEDICAL RECORD NO.:  [id number]  LOCATION:  [facility]                         FACILITY:  [facility]  PHYSICIAN:  Larina Earthly, M.D.    DATE OF BIRTH:  1925/08/25  DATE OF PROCEDURE:  10/16/2010 DATE OF DISCHARGE:  10/16/2010                              OPERATIVE REPORT   PREOPERATIVE DIAGNOSIS:  Chronic renal insufficiency.  POSTOPERATIVE DIAGNOSIS:  Chronic renal insufficiency.  PROCEDURE:  Right upper arm arteriovenous fistula creation.  SURGEON:  Larina Earthly, MD  ASSISTANT:  Pecola Leisure, PA-C  ANESTHESIA:  MAC.  COMPLICATIONS:  None.  DISPOSITION:  To recovery room stable.  PROCEDURE IN DETAIL:  The patient was taken to the operating room and placed in supine position.  The area of the right arm was prepped and draped in usual sterile fashion.  The arm was imaged with ultrasound. Preoperative vein mapping had shown marginal size of her cephalic vein. The patient did have patent cephalic vein throughout the antecubital space.  Using local anesthesia, an incision was made over the antecubital space and carried down to isolate the brachial artery which was of excellent size and the cephalic vein at the antecubital space which was also moderate size.  The vein was mobilized proximally and distally and was ligated distally and divided.  The vein was gently dilated and was of adequate caliber for a fistula attempt.  The artery was occluded proximally and distally.  It was opened with 11 blade and sewn longitudinally using Potts scissors.  The vein was cut to the appropriate length and sewn end-to-side of the artery with a running 6-0 Prolene suture.  Clamps removed.  Good thrill was noted.  The wounds were irrigated with saline.  Hemostasis with electrocautery.  Wounds were closed with 3-0 Vicryl in the subcutaneous and subcuticular tissue. Benzoin and Steri-Strips were applied.     Larina Earthly,  M.D.     TFE/MEDQ  D:  10/16/2010  T:  10/16/2010  Job:  [id number]  Electronically Signed by Sura Canul M.D. on 10/27/2010 01:11:52 PM

## 2010-10-30 ENCOUNTER — Telehealth: Payer: Self-pay | Admitting: Internal Medicine

## 2010-10-30 NOTE — Telephone Encounter (Signed)
Pts son called and said, that pt has stage 5 kidney failure and is very depressed. She does not want to do dialysis, and pts attitude is changing. Pt is sch to come in to see Dr Cato Mulligan on Monday 9/24 and pts son would like for Dr Cato Mulligan to address the depression with the patient on Monday and suggest a depression med.

## 2010-11-02 ENCOUNTER — Telehealth: Payer: Self-pay | Admitting: Internal Medicine

## 2010-11-02 ENCOUNTER — Ambulatory Visit (INDEPENDENT_AMBULATORY_CARE_PROVIDER_SITE_OTHER): Payer: Medicare Other | Admitting: Internal Medicine

## 2010-11-02 ENCOUNTER — Encounter: Payer: Self-pay | Admitting: Internal Medicine

## 2010-11-02 VITALS — BP 155/60 | HR 72 | Temp 98.1°F | Wt 130.0 lb

## 2010-11-02 DIAGNOSIS — N19 Unspecified kidney failure: Secondary | ICD-10-CM

## 2010-11-02 DIAGNOSIS — E785 Hyperlipidemia, unspecified: Secondary | ICD-10-CM

## 2010-11-02 DIAGNOSIS — I10 Essential (primary) hypertension: Secondary | ICD-10-CM

## 2010-11-02 DIAGNOSIS — I129 Hypertensive chronic kidney disease with stage 1 through stage 4 chronic kidney disease, or unspecified chronic kidney disease: Secondary | ICD-10-CM

## 2010-11-02 LAB — CBC WITH DIFFERENTIAL/PLATELET
Basophils Absolute: 0.1 10*3/uL (ref 0.0–0.1)
Basophils Relative: 0.7 % (ref 0.0–3.0)
Eosinophils Absolute: 0.2 10*3/uL (ref 0.0–0.7)
Lymphocytes Relative: 17.2 % (ref 12.0–46.0)
MCHC: 32.2 g/dL (ref 30.0–36.0)
MCV: 94.7 fl (ref 78.0–100.0)
Monocytes Absolute: 0.6 10*3/uL (ref 0.1–1.0)
Neutrophils Relative %: 74.4 % (ref 43.0–77.0)
RBC: 3.7 Mil/uL — ABNORMAL LOW (ref 3.87–5.11)
RDW: 15.4 % — ABNORMAL HIGH (ref 11.5–14.6)

## 2010-11-02 LAB — BASIC METABOLIC PANEL
BUN: 62 mg/dL — ABNORMAL HIGH (ref 6–23)
CO2: 23 mEq/L (ref 19–32)
Calcium: 8.8 mg/dL (ref 8.4–10.5)
Chloride: 101 mEq/L (ref 96–112)
Creatinine, Ser: 4.6 mg/dL (ref 0.4–1.2)
Glucose, Bld: 92 mg/dL (ref 70–99)

## 2010-11-02 LAB — HEPATIC FUNCTION PANEL
Albumin: 3.3 g/dL — ABNORMAL LOW (ref 3.5–5.2)
Alkaline Phosphatase: 70 U/L (ref 39–117)
Bilirubin, Direct: 0 mg/dL (ref 0.0–0.3)
Total Protein: 7.9 g/dL (ref 6.0–8.3)

## 2010-11-02 LAB — LIPID PANEL
HDL: 32.7 mg/dL — ABNORMAL LOW (ref >39.00)
Triglycerides: 175 mg/dL — ABNORMAL HIGH (ref 0.0–149.0)
VLDL: 35 mg/dL (ref 0.0–40.0)

## 2010-11-02 MED ORDER — FUROSEMIDE 40 MG PO TABS: 40.0000 mg | ORAL_TABLET | Freq: Every day | ORAL | Status: DC

## 2010-11-02 MED ORDER — SERTRALINE HCL 50 MG PO TABS: 50.0000 mg | ORAL_TABLET | Freq: Every day | ORAL | Status: DC

## 2010-11-02 NOTE — Progress Notes (Signed)
  Subjective:    Patient ID: Christine Graham, female    DOB: 1925-11-18, 77 y.o.   MRN: [id number]  HPI History from patient and son. Renal failure---recent AV graft (dr early) in anticipation of dialysis.  HTN---she is tolerating meds but not monitoring BP. She says she is compliant with meds. Son disagrees. She is able to name her meds and doses--son is quite sure that she is not taking medications as prescribed.  Lipids tolerating meds-per her report  Past Medical History  Diagnosis Date  . Hyperlipidemia   . Hypertension   . Gout   . Chronic kidney disease    Past Surgical History  Procedure Date  . Appendectomy   . Cholecystectomy   . Total hip arthroplasty   . Breast surgery     benign breast biopsy    reports that she has never smoked. She does not have any smokeless tobacco history on file. She reports that she does not drink alcohol or use illicit drugs. family history includes Arthritis in her mother and Heart disease in her father. No Known Allergies   Review of Systems  patient denies chest pain, shortness of breath, orthopnea. Denies lower extremity edema, abdominal pain, change in appetite, change in bowel movements. Patient denies rashes, musculoskeletal complaints. No other specific complaints in a complete review of systems.      Objective:   Physical Exam  Well-developed well-nourished female in no acute distress. HEENT exam atraumatic, normocephalic, extraocular muscles are intact. Neck is supple. No jugular venous distention no thyromegaly. Chest clear to auscultation without increased work of breathing. Cardiac exam S1 and S2 are regular. Abdominal exam active bowel sounds, soft, nontender. Extremities no edema. Healing scar right arm.        Assessment & Plan:

## 2010-11-02 NOTE — Telephone Encounter (Signed)
Bri

## 2010-11-02 NOTE — Telephone Encounter (Signed)
Patients' son Christine Graham [facility]) requests a copy of Christine Pe lab work from today.

## 2010-11-03 ENCOUNTER — Ambulatory Visit: Payer: Medicare Other | Admitting: Vascular Surgery

## 2010-11-03 ENCOUNTER — Other Ambulatory Visit: Payer: Medicare Other

## 2010-11-03 ENCOUNTER — Telehealth: Payer: Self-pay | Admitting: *Deleted

## 2010-11-03 LAB — CBC
HCT: 35.9 — ABNORMAL LOW
Hemoglobin: 12.3
MCHC: 34.1
Platelets: 311
RDW: 12.7

## 2010-11-03 LAB — URINALYSIS, ROUTINE W REFLEX MICROSCOPIC
Bilirubin Urine: NEGATIVE
Glucose, UA: NEGATIVE
Nitrite: NEGATIVE
Specific Gravity, Urine: 1.016
pH: 6

## 2010-11-03 LAB — DIFFERENTIAL
Basophils Absolute: 0
Basophils Relative: 0
Eosinophils Absolute: 0.2
Eosinophils Relative: 1
Monocytes Absolute: 0.9

## 2010-11-03 LAB — POCT I-STAT, CHEM 8
Calcium, Ion: 1.13
HCT: 37
Hemoglobin: 12.6
Sodium: 140
TCO2: 29

## 2010-11-03 LAB — URINE MICROSCOPIC-ADD ON

## 2010-11-03 LAB — URINE CULTURE

## 2010-11-03 NOTE — Telephone Encounter (Signed)
She has regular f/u with nephrology. Fax labs to nephrology

## 2010-11-03 NOTE — Telephone Encounter (Signed)
Labs faxed

## 2010-11-03 NOTE — Telephone Encounter (Signed)
[facility] Triage Call Report Triage Record Num: [id number] Operator: Craig Guess Patient Name: Christine Graham Call Date & Time: 11/02/2010 5:29:33PM Patient Phone: [phone] PCP: Valetta Mole. Swords Patient Gender: Female PCP Fax : [phone] Patient DOB: Jun 21, 1925 Practice Name: [facility] Reason for Call: Tonya, calling from lab at [facility] with critical creatine for this lady. PCP is Dr Cato Mulligan. Callback number is [phone]. Creatine is 4.63 with GFR of 9.58%. Prior on 06/01/10 Creatine was 3.4 and GFR was 13.56%. Results to Dr Everardo All. No new orders. Protocol(s) Used: Office Note Recommended Outcome per Protocol: Information Noted and Sent to Office Reason for Outcome: Caller information to office Care Advice: ~ 11/02/2010 5:38:48PM Page 1 of 1 [email or web address]

## 2010-11-09 NOTE — Assessment & Plan Note (Signed)
She has regular followup with nephrology.

## 2010-11-09 NOTE — Assessment & Plan Note (Signed)
Tolerating medications without difficulty.

## 2010-11-09 NOTE — Assessment & Plan Note (Signed)
BP Readings from Last 3 Encounters:  11/02/10 155/60  10/07/10 177/55  09/16/10 172/68   Patient admits that she has not taken her medications today. I discussed with the patient and her son the need for a pillbox. Her son is willing to be responsible for filling the pillbox on a weekly basis.

## 2010-11-10 ENCOUNTER — Encounter: Payer: Self-pay | Admitting: Thoracic Diseases

## 2010-11-10 ENCOUNTER — Ambulatory Visit (HOSPITAL_COMMUNITY)
Admission: RE | Admit: 2010-11-10 | Discharge: 2010-11-10 | Disposition: A | Discharge status: Home / Self Care | Payer: Medicare Other | Source: Ambulatory Visit | Attending: Nephrology | Admitting: Nephrology

## 2010-11-10 ENCOUNTER — Other Ambulatory Visit (HOSPITAL_COMMUNITY): Payer: Self-pay | Admitting: Nephrology

## 2010-11-10 DIAGNOSIS — N186 End stage renal disease: Secondary | ICD-10-CM

## 2010-11-11 LAB — HEPATITIS B SURFACE ANTIGEN: Hepatitis B Surface Ag: NEGATIVE

## 2010-11-12 ENCOUNTER — Ambulatory Visit: Payer: Medicare Other

## 2010-11-17 ENCOUNTER — Encounter: Payer: Self-pay | Admitting: Thoracic Diseases

## 2010-11-18 ENCOUNTER — Ambulatory Visit (INDEPENDENT_AMBULATORY_CARE_PROVIDER_SITE_OTHER): Payer: Medicare Other | Admitting: Thoracic Diseases

## 2010-11-18 ENCOUNTER — Encounter: Payer: Self-pay | Admitting: Thoracic Diseases

## 2010-11-18 VITALS — BP 168/60 | HR 64 | Resp 20 | Ht 67.0 in | Wt 132.7 lb

## 2010-11-18 DIAGNOSIS — N186 End stage renal disease: Secondary | ICD-10-CM

## 2010-11-18 NOTE — Progress Notes (Signed)
[facility] [place]  Postoperative Visit hemodialysis access   Date of Surgery: 10/16/10 Surgeon: TFE Pt had right IJ TDC placed by IR  HPI: Christine Graham is a 77 y.o. female who is 4 weeks S/P creation/revision of right upper extremity Hemodialysis access. The patient denies symptoms of numbness, tingling, weakness and denies pain in the operative limb. Patient is here for post -op evaluation to assess healing and maturation of right brachiocephalic AVF .  Pt is on hemodialysis through  right IJ on TTHS.  Physical Examination  Filed Vitals:   11/18/10 1558  BP: 168/60  Pulse: 64  Resp: 20    WDWN female in NAD.  right upper extremity Incision is clean, dry, intact Skin color is normal   Hand grip is 5/5 and sensation in digits is intact; There is a good thrill and good bruit in the AVF. The graft/fistula is easily palpable and of adequate size  Assessment/Plan Christine Graham is a 77 y.o. year old female who presents s/p creation/revision of right brachiocephalic AVF Hemodialysis access with no signs of steal. Follow-up in 8 weeks  The patient's access will be ready for use in 8 weeks.  Clinic MD: CS Dickson,MD

## 2010-12-04 ENCOUNTER — Telehealth: Payer: Self-pay | Admitting: Internal Medicine

## 2010-12-04 DIAGNOSIS — F419 Anxiety disorder, unspecified: Secondary | ICD-10-CM

## 2010-12-04 MED ORDER — ALPRAZOLAM 0.25 MG PO TABS: 0.2500 mg | ORAL_TABLET | Freq: Every day | ORAL | Status: DC | PRN

## 2010-12-04 NOTE — Telephone Encounter (Signed)
Pt requesting refill on ALPRAZolam (XANAX) 0.25 MG tablet  Pt is completely out and is wanting to get it filled today

## 2010-12-04 NOTE — Telephone Encounter (Addendum)
Per Dr Cato Mulligan, pt needs to drop down to 1 daily PRN.  Ok to call in #30/2 refills, rx called in, pt aware

## 2010-12-04 NOTE — Telephone Encounter (Signed)
Addended by: Alfred Levins D on: 12/04/2010 04:08 PM   Modules accepted: Orders

## 2010-12-04 NOTE — Telephone Encounter (Signed)
Pt called and states that Dr Cato Mulligan increased her xanax to 2 tablets a day.  Is this ok?

## 2010-12-04 NOTE — Telephone Encounter (Signed)
Refill too soon, pharmacy aware

## 2010-12-18 ENCOUNTER — Encounter: Payer: Self-pay | Admitting: Vascular Surgery

## 2010-12-24 ENCOUNTER — Telehealth: Payer: Self-pay | Admitting: Internal Medicine

## 2010-12-24 DIAGNOSIS — F419 Anxiety disorder, unspecified: Secondary | ICD-10-CM

## 2010-12-24 NOTE — Telephone Encounter (Signed)
Pt is having a lot of anxiety  And son would like to know it they could increase xanax to two pills a day.  Pt also needs refill on ALPRAZolam (XANAX) 0.25 MG tablet

## 2010-12-25 MED ORDER — ALPRAZOLAM 0.25 MG PO TABS: 0.2500 mg | ORAL_TABLET | Freq: Two times a day (BID) | ORAL | Status: DC | PRN

## 2010-12-25 NOTE — Telephone Encounter (Signed)
rx called in, pt aware 

## 2010-12-25 NOTE — Telephone Encounter (Signed)
Xanax 0.25 mg po bid prn anxiety #60/1 refill

## 2011-01-04 ENCOUNTER — Encounter: Payer: Self-pay | Admitting: Vascular Surgery

## 2011-01-05 ENCOUNTER — Encounter: Payer: Self-pay | Admitting: Vascular Surgery

## 2011-01-05 ENCOUNTER — Ambulatory Visit (INDEPENDENT_AMBULATORY_CARE_PROVIDER_SITE_OTHER): Payer: Medicare Other | Admitting: Vascular Surgery

## 2011-01-05 VITALS — BP 188/133 | HR 61 | Resp 16 | Ht 67.0 in | Wt 132.0 lb

## 2011-01-05 DIAGNOSIS — I729 Aneurysm of unspecified site: Secondary | ICD-10-CM

## 2011-01-05 NOTE — Progress Notes (Signed)
The patient presents today for followup of her right upper arm AV fistula creation on 10/16/2010. She has subsequently been initiated on hemodialysis with the catheter placed by interventional radiology. Denies symptoms of steal.  Physical exam: Excellent thrill in her right upper arm AV fistula. She does not have any evidence of competing side branch by physical exam. She has a moderate sized cephalic vein fistula.  Impression and plan: A long discussion with the patient and her son present. I explained that she does have a moderate size maturation of her AV fistula. I explained that I would wait an additional one month and it would attempt access. I explained the importance of having the most experience access personnel accessing her fistula. If this is successful then she can have her catheter removed. If this is unsuccessful we will need to reevaluate options for long-term hemodialysis access.

## 2011-01-08 ENCOUNTER — Encounter: Payer: Self-pay | Admitting: Nephrology

## 2011-02-24 ENCOUNTER — Other Ambulatory Visit (HOSPITAL_COMMUNITY): Payer: Self-pay | Admitting: Nephrology

## 2011-02-24 DIAGNOSIS — N186 End stage renal disease: Secondary | ICD-10-CM

## 2011-02-26 ENCOUNTER — Ambulatory Visit (HOSPITAL_COMMUNITY): Admission: RE | Admit: 2011-02-26 | Payer: Medicare Other | Source: Ambulatory Visit

## 2011-03-03 ENCOUNTER — Ambulatory Visit (HOSPITAL_COMMUNITY)
Admission: RE | Admit: 2011-03-03 | Discharge: 2011-03-03 | Disposition: A | Discharge status: Home / Self Care | Payer: Medicare Other | Source: Ambulatory Visit | Attending: Nephrology | Admitting: Nephrology

## 2011-03-03 DIAGNOSIS — Z992 Dependence on renal dialysis: Secondary | ICD-10-CM | POA: Insufficient documentation

## 2011-03-03 DIAGNOSIS — Z4682 Encounter for fitting and adjustment of non-vascular catheter: Secondary | ICD-10-CM | POA: Insufficient documentation

## 2011-03-03 DIAGNOSIS — N186 End stage renal disease: Secondary | ICD-10-CM

## 2011-03-03 MED ORDER — CHLORHEXIDINE GLUCONATE 4 % EX LIQD
CUTANEOUS | Status: AC
  Filled 2011-03-03: qty 30

## 2011-03-03 NOTE — Procedures (Signed)
Right internal jugular tunneled hemodialysis catheter removed in its entirety without immediate complications. Medication utilized- 1% lidocaine to skin and SQ tissue.

## 2011-03-04 ENCOUNTER — Telehealth (HOSPITAL_COMMUNITY): Payer: Self-pay

## 2011-04-05 ENCOUNTER — Other Ambulatory Visit: Payer: Self-pay | Admitting: Internal Medicine

## 2011-04-05 DIAGNOSIS — F419 Anxiety disorder, unspecified: Secondary | ICD-10-CM

## 2011-04-05 MED ORDER — ALPRAZOLAM 0.25 MG PO TABS: 0.2500 mg | ORAL_TABLET | Freq: Two times a day (BID) | ORAL | Status: DC | PRN

## 2011-04-05 NOTE — Telephone Encounter (Signed)
Pt needs refill on alprazolam 0.25mg  call [facility] [phone]

## 2011-04-05 NOTE — Telephone Encounter (Signed)
Ok per Dr Swords, rx called in 

## 2011-06-03 ENCOUNTER — Other Ambulatory Visit: Payer: Self-pay | Admitting: Internal Medicine

## 2011-07-05 ENCOUNTER — Emergency Department (HOSPITAL_COMMUNITY): Payer: Medicare Other

## 2011-07-05 ENCOUNTER — Encounter (HOSPITAL_COMMUNITY): Payer: Self-pay | Admitting: Emergency Medicine

## 2011-07-05 ENCOUNTER — Emergency Department (HOSPITAL_COMMUNITY)
Admission: EM | Admit: 2011-07-05 | Discharge: 2011-07-05 | Disposition: A | Discharge status: Home / Self Care | Payer: Medicare Other | Attending: Emergency Medicine | Admitting: Emergency Medicine

## 2011-07-05 DIAGNOSIS — R55 Syncope and collapse: Secondary | ICD-10-CM

## 2011-07-05 DIAGNOSIS — E785 Hyperlipidemia, unspecified: Secondary | ICD-10-CM | POA: Insufficient documentation

## 2011-07-05 DIAGNOSIS — D72829 Elevated white blood cell count, unspecified: Secondary | ICD-10-CM | POA: Insufficient documentation

## 2011-07-05 DIAGNOSIS — I129 Hypertensive chronic kidney disease with stage 1 through stage 4 chronic kidney disease, or unspecified chronic kidney disease: Secondary | ICD-10-CM | POA: Insufficient documentation

## 2011-07-05 DIAGNOSIS — Z79899 Other long term (current) drug therapy: Secondary | ICD-10-CM | POA: Insufficient documentation

## 2011-07-05 DIAGNOSIS — N189 Chronic kidney disease, unspecified: Secondary | ICD-10-CM | POA: Insufficient documentation

## 2011-07-05 LAB — BASIC METABOLIC PANEL
BUN: 9 mg/dL (ref 6–23)
Chloride: 94 mEq/L — ABNORMAL LOW (ref 96–112)
Glucose, Bld: 153 mg/dL — ABNORMAL HIGH (ref 70–99)
Potassium: 3.4 mEq/L — ABNORMAL LOW (ref 3.5–5.1)

## 2011-07-05 LAB — POCT I-STAT TROPONIN I: Troponin i, poc: 0 ng/mL (ref 0.00–0.08)

## 2011-07-05 LAB — URINALYSIS, ROUTINE W REFLEX MICROSCOPIC
Glucose, UA: NEGATIVE mg/dL
Specific Gravity, Urine: 1.011 (ref 1.005–1.030)
pH: 8.5 — ABNORMAL HIGH (ref 5.0–8.0)

## 2011-07-05 LAB — URINE MICROSCOPIC-ADD ON

## 2011-07-05 LAB — CBC
HCT: 30 % — ABNORMAL LOW (ref 36.0–46.0)
Hemoglobin: 10.1 g/dL — ABNORMAL LOW (ref 12.0–15.0)
WBC: 21.4 10*3/uL — ABNORMAL HIGH (ref 4.0–10.5)

## 2011-07-05 NOTE — ED Provider Notes (Signed)
History     CSN: [id number]  Arrival date & time 07/05/11  1540   First MD Initiated Contact with Patient 07/05/11 1548      Chief Complaint  Patient presents with  . Loss of Consciousness    (Consider location/radiation/quality/duration/timing/severity/associated sxs/prior treatment) The history is provided by the patient and a relative.  78 y/o F with PMH HLD, HTN, anemia, ESRD with T/TH/Sa HD presents to ED with c/c of LOC x 2 today just PTA. Received dialysis a day early this morning as she is going out of town tomorrow, was told her Hgb was low prior to treatment. Typically goes home to rest after treatments but today went to eat with a friend. Began to feel weak and lightheaded as she finished a small meal, had witnessed LOC while sitting in chair, lasting approx 1 minute. No fall or head injury. Awoke and was A&O. Had second episode witnessed by EMS prior to transport that was also preceded by sensation of lightheadedness. Denies any recent illness. Denies any fever, CP, SOB, nausea/vomiting.  No aggravating or alleviating factors. Currently feels hungry but otherwise normal for her. Family reports she is at baseline. No prior treatment.  Past Medical History  Diagnosis Date  . Hyperlipidemia   . Hypertension   . Gout   . Chronic kidney disease   . Anemia     Past Surgical History  Procedure Date  . Appendectomy   . Cholecystectomy   . Total hip arthroplasty   . Breast surgery     benign breast biopsy  . Av fistula placement 10/16/2010    (R) UA AVF    Family History  Problem Relation Age of Onset  . Heart disease Father     MI  . Arthritis Mother   . Stroke Mother     History  Substance Use Topics  . Smoking status: Never Smoker   . Smokeless tobacco: Not on file  . Alcohol Use: No     Review of Systems 10 systems reviewed and are negative for acute change except as noted in the HPI.  Allergies  Codeine and Oxycodone  Home Medications   Current  Outpatient Rx  Name Route Sig Dispense Refill  . ACETAMINOPHEN 325 MG PO TABS Oral Take 650 mg by mouth every 6 (six) hours as needed. For pain/fever    . ALPRAZOLAM 0.25 MG PO TABS Oral Take 0.25 mg by mouth 2 (two) times daily as needed. For anxiety    . AMLODIPINE BESYLATE 10 MG PO TABS Oral Take 10 mg by mouth daily.    . ASPIRIN EC 81 MG PO TBEC Oral Take 81 mg by mouth daily.    . ATENOLOL 50 MG PO TABS Oral Take 50 mg by mouth 2 (two) times daily.    . ERGOCALCIFEROL 50000 UNITS PO CAPS Oral Take 50,000 Units by mouth once a week.      Marland Kitchen ONE-DAILY MULTI VITAMINS PO TABS Oral Take 1 tablet by mouth daily.      Marland Kitchen HYDRALAZINE HCL 50 MG PO TABS Oral Take 50 mg by mouth 3 (three) times daily.        BP 173/38  Pulse 54  Temp(Src) 98.2 F (36.8 C) (Oral)  Resp 18  SpO2 96%  Physical Exam  Nursing note reviewed. Constitutional: She is oriented to person, place, and time. She appears well-developed and well-nourished. No distress.       VS reviewed, sig for HTN  HENT:  Head: Normocephalic and  atraumatic.  Right Ear: External ear normal.  Left Ear: External ear normal.       Oral mucosa moist  Eyes: EOM are normal. Pupils are equal, round, and reactive to light.       Visual fields full   Neck: Neck supple.  Cardiovascular: Normal rate and regular rhythm.   No murmur heard. Pulses:      Radial pulses are 2+ on the right side, and 2+ on the left side.       Dorsalis pedis pulses are 2+ on the right side, and 2+ on the left side.       Left carotid bruit  Pulmonary/Chest: Effort normal and breath sounds normal. No respiratory distress. She has no wheezes.  Abdominal: Soft. Bowel sounds are normal. She exhibits no distension. There is no tenderness.  Musculoskeletal: She exhibits no edema and no tenderness.       Dialysis shunt to right upper arm with palpable thrill  Neurological: She is alert and oriented to person, place, and time. She has normal strength. Cranial nerve  deficit: 3-12 intact. Sensory deficit: sensation intact to light touch. Abnormal coordination: F-N intact bilaterally. GCS eye subscore is 4. GCS verbal subscore is 5. GCS motor subscore is 6.  Skin: Skin is warm and dry.    ED Course  Procedures (including critical care time)  Labs Reviewed  CBC - Abnormal; Notable for the following:    WBC 21.4 (*)    RBC 3.17 (*)    Hemoglobin 10.1 (*)    HCT 30.0 (*)    All other components within normal limits  BASIC METABOLIC PANEL - Abnormal; Notable for the following:    Potassium 3.4 (*)    Chloride 94 (*)    CO2 33 (*)    Glucose, Bld 153 (*)    Creatinine, Ser 1.63 (*)    GFR calc non Af Amer 28 (*)    GFR calc Af Amer 32 (*)    All other components within normal limits  URINALYSIS, ROUTINE W REFLEX MICROSCOPIC - Abnormal; Notable for the following:    APPearance CLOUDY (*)    pH 8.5 (*)    Protein, ur 100 (*)    Leukocytes, UA TRACE (*)    All other components within normal limits  URINE MICROSCOPIC-ADD ON - Abnormal; Notable for the following:    Squamous Epithelial / LPF MANY (*)    All other components within normal limits  POCT I-STAT TROPONIN I   Dg Chest 2 View  07/05/2011  *RADIOLOGY REPORT*  Clinical Data: Dizziness.  Weakness.  Atrial flutter.  Chronic renal failure on dialysis.  CHEST - 2 VIEW  Comparison: 10/15/2010  Findings: Heart size remains at the upper limits of normal.  Both lungs are clear.  No evidence of pulmonary infiltrate or edema.  No evidence of pleural effusion.  No mass or lymphadenopathy identified.  IMPRESSION: No active disease.  Original Report Authenticated By: Danae Orleans, M.D.     Date: 07/05/2011  Rate: 60  Rhythm: atrial flutter  QRS Axis: normal  Intervals: unable to assess secondary to flutter  ST/T Wave abnormalities: normal  Old EKG Reviewed: changes noted, compared to 10/15/2010, atrial flutter is new    Dx 1: Syncope Dx 2: Leukocytosis   MDM  Elderly pt with ESRD,  poorly-controlled HTN with syncopal episode x 2 today after dialysis, hx syncope related to dialysis in the past. Pt A&O on exam, no gross motor or neuro deficit, only  remarkable for left carotid bruit. Repeat EKG remains with a-flutter appearance, cardiology is consulted for eval. Pt seen by NP and Dr Eden Emms- repeat EKG at that time showed NSR. Pt without orthostasis, ambulatory without difficulty in ED. Dr Eden Emms recommends outpt carotid doppler, will f.u in office. No medication changes at this time.  Labs are reviewed and demonstrate slight electrolyte disturbance attributable to recent dialysis. Leukocytosis- suspect stress demargination rather than infection as no evidence of UTI, no pneumonia on CXR, no fever on repeat VS checks.   Pt has been advised to f/u with cards, renal, and PCP. Pt and family voice understanding of plan. She will be d/c home, return precautions discussed.        Shaaron Adler, [place] 07/05/11 2052

## 2011-07-05 NOTE — ED Notes (Signed)
Dialysis pt. Right arm DO NOT STICK

## 2011-07-05 NOTE — ED Notes (Signed)
Pt in via [facility], pt in from [facility] called d/t syncopal episode witnessed lasting 1 min, EMS arrival pt A&O x4, then EMS witnessed a 2nd syncopal episode lasting 1 min., prior to syncopal episode pt was diaphoretic & shaking, pt A&O x4 upon arrival to ED, pt dx low hemoglobin today at HD center, pt received full HD today that was 1 day sooner than normal schedule

## 2011-07-05 NOTE — Discharge Instructions (Signed)
Syncope You have had a fainting (syncopal) spell. A fainting episode is a sudden, short-lived loss of consciousness. It results in complete recovery. It occurs because there has been a temporary shortage of oxygen and/or sugar (glucose) to the brain. CAUSES   Blood pressure pills and other medications that may lower blood pressure below normal. Sudden changes in posture (sudden standing).   Over-medication. Take your medications as directed.   Standing too long. This can cause blood to pool in the legs.   Seizure disorders.   Low blood sugar (hypoglycemia) of diabetes. This more commonly causes coma.   Bearing down to go to the bathroom. This can cause your blood pressure to rise suddenly. Your body compensates by making the blood pressure too low when you stop bearing down.   Hardening of the arteries where the brain temporarily does not receive enough blood.   Irregular heart beat and circulatory problems.   Fear, emotional distress, injury, sight of blood, or illness.  Your caregiver will send you home if the syncope was from non-worrisome causes (benign). Depending on your age and health, you may stay to be monitored and observed. If you return home, have someone stay with you if your caregiver feels that is desirable. It is very important to keep all follow-up referrals and appointments in order to properly manage this condition. This is a serious problem which can lead to serious illness and death if not carefully managed.  WARNING: Do not drive or operate machinery until your caregiver feels that it is safe for you to do so. SEEK IMMEDIATE MEDICAL CARE IF:   You have another fainting episode or faint while lying or sitting down. DO NOT DRIVE YOURSELF. Call 911 if no other help is available.   You have chest pain, are feeling sick to your stomach (nausea), vomiting or abdominal pain.   You have an irregular heartbeat or one that is very fast (pulse over 120 beats per minute).    You have a loss of feeling in some part of your body or lose movement in your arms or legs.   You have difficulty with speech, confusion, severe weakness, or visual problems.   You become sweaty and/or feel light headed.  Make sure you are rechecked as instructed. Document Released: 01/25/2005 Document Revised: 01/14/2011 Document Reviewed: 09/15/2006 [employer] Patient Information 2012 [employer]. 

## 2011-07-05 NOTE — ED Notes (Signed)
Pt. States continue to drop into upper 80's. Pt. Skin Color appropriate and no complaints. But put on 2L nasal canula.

## 2011-07-05 NOTE — Consult Note (Signed)
CARDIOLOGY CONSULT NOTE   Patient ID: Christine Graham MRN: [id number] DOB/AGE: May 03, 1925 78 y.o.  Admit date: 07/05/2011  Primary Physician   Judie Petit, MD, MD Primary Cardiologist   none Reason for Consultation   Syncope, possible arrhythmia  WUJ:[id number] Christine Graham is a 78 y.o. female with no history of CAD.   She had dialysis today (unusual for her) and had a syncopal episode at [facility]. Her BP was low in the ER and cardiology was asked to evaluate her.   She has had low blood pressure before and and has had previous episodes of syncope related to hypotension after dialysis. Normally, she goes home and rest after dialysis but did not do that today. Per family and EMS reports, there was no incontinence of bowel or bladder. She was diaphoretic and possibly trembling but no seizure activity was noted. The patient had no chest pain and no shortness of breath. She has no palpitations and denies any history of ever being aware that her heart rate is high oral. She admits to being extremely nervous. Right after waking up, she was alert and oriented with no focal deficits noted by family or EMS. In the emergency room, her initial ECG was concerning for atrial flutter with complete heart block. A repeat ECG was read as accelerated junctional escape rhythm. Currently, her ECG demonstrates sinus rhythm with a heart rate in the 60s. Currently she is resting comfortably.   Past Medical History  Diagnosis Date  . Hyperlipidemia   . Hypertension   . Gout   . Chronic kidney disease - on dialysis for less than one year    . Anemia      Past Surgical History  Procedure Date  . Appendectomy   . Cholecystectomy   . Total hip arthroplasty   . Breast surgery     benign breast biopsy  . Av fistula placement 10/16/2010    (R) UA AVF    Allergies  Allergen Reactions  . Codeine   . Oxycodone     Christine have reviewed the patient's current medications Medication Sig  acetaminophen (TYLENOL) 325  MG tablet Take 650 mg by mouth every 6 hours as needed, for pain/fever  ALPRAZolam (XANAX) 0.25 MG tablet Take 0.25 mg by mouth 2 times daily as needed. For anxiety  amLODipine (NORVASC) 10 MG tablet Take 10 mg by mouth daily.  aspirin EC 81 MG tablet Take 81 mg by mouth daily.  atenolol (TENORMIN) 50 MG tablet Take 50 mg by mouth 2 (two) times daily.  ergocalciferol (VITAMIN D2) 50000 UNITS capsule Take 50,000 Units by mouth once a week.    Multiple Vitamin (MULTIVITAMIN) tablet Take 1 tablet by mouth daily.    hydrALAZINE (APRESOLINE) 50 MG tablet Take 50 mg by mouth 3 (three) times daily.      History   Social History  . Marital Status: Widowed    Spouse Name: N/A    Number of Children: N/A  . Years of Education: N/A   Occupational History  . Retired from running a day care center   Social History Main Topics  . Smoking status: Never Smoker   . Smokeless tobacco: Not on file  . Alcohol Use: No  . Drug Use: No  . Sexually Active:    Social History Narrative  . Lives alone     Family History  Problem Relation Age of Onset  . Heart disease Father     MI  . Arthritis Mother   .  Stroke Mother      ROS: She has occasional musculoskeletal aches and pains. She feels weak after dialysis appointments and generally goes home to rest for while. Today she did not do that. She normally dialyzes on Tuesday Thursday and Saturday. She kept her Saturday appointment but was dialyzing today because she will be out of town tomorrow. Per the patient, they did the usual dialysis without anything unusual. She has no recent illnesses or new problems. Other than during or right after dialysis, she has no history of presyncope or syncope. She has never had palpitations and never has any awareness of a heart rate that is either too fast or too slow. Full 14 point review of systems complete and found to be negative unless listed above.  Physical Exam: Blood pressure 173/38, pulse 54, temperature 98.2  F (36.8 C), temperature source Oral, resp. rate 18, SpO2 96.00%.  General: Well developed, elderly, female in no acute distress Head: Eyes PERRLA, No xanthomas.   Normocephalic and atraumatic, oropharynx without edema or exudate. Dentition poor Lungs: bilateral Rales Heart: HRRR S1 S2, no rub/gallop, 2/6 systolic murmur. pulses are 2+ all 4 extrem.   Neck: Bilateral carotid bruits that may be radiation of her murmur. No lymphadenopathy.  JVD approximately 6 cm. Abdomen: Bowel sounds present, abdomen soft and non-tender without masses or hernias noted. Msk:  No spine or cva tenderness. No weakness, no joint deformities or effusions. Extremities: No clubbing or cyanosis. No edema.  Neuro: Alert and oriented X 3. No focal deficits noted. Psych:  Good affect, responds appropriately Skin: No rashes or lesions noted.  Labs:   Lab Results  Component Value Date   WBC 21.4* 07/05/2011   HGB 10.1* 07/05/2011   HCT 30.0* 07/05/2011   MCV 94.6 07/05/2011   PLT 332 07/05/2011     Lab 07/05/11 1617  NA 138  K 3.4*  CL 94*  CO2 33*  BUN 9  CREATININE 1.63*  CALCIUM 8.8  PROT --  BILITOT --  ALKPHOS --  ALT --  AST --  GLUCOSE 153*    Basename 07/05/11 1631  TROPIPOC 0.00   ECG:  05-Jul-2011 15:56:17  Read as: A-FLUTTER/FIBRILLATION W/ COMPLETE AV BLOCK ~ A-rate>220, V-rate< 50, AV dissoc But is sinus rhythm on repeat ECG - think artifact was obscuring SR on the first ECGs. CONSIDER ANTERIOR INFARCT ~ Q >2mS in V3 Vent. rate 60 BPM PR interval * ms QRS duration 76 ms QT/QTc 488/488 ms P-R-T axes * 67 69  Radiology:  Dg Chest 2 View 07/05/2011  *RADIOLOGY REPORT*  Clinical Data: Dizziness.  Weakness.  Atrial flutter.  Chronic renal failure on dialysis.  CHEST - 2 VIEW  Comparison: 10/15/2010  Findings: Heart size remains at the upper limits of normal.  Both lungs are clear.  No evidence of pulmonary infiltrate or edema.  No evidence of pleural effusion.  No mass or lymphadenopathy  identified.  IMPRESSION: No active disease.  Original Report Authenticated By: Danae Orleans, M.D.    ASSESSMENT AND PLAN:   The patient was seen today by Dr Eden Emms, the patient evaluated and the data reviewed.  1. syncope: The patient had dialysis this morning and has a history of syncope during dialysis as well as presyncope after dialysis. She altered her usual schedule and did not go home and rest after dialysis today. She was possibly orthostatic but had no palpitations or any indication that there was a cardiac event. We'll check orthostatic vital signs in the  emergency room and follow up on the results.  2. possible atrial flutter: Her ECG has ways that are concerning for flutter waves but may also be artifact. A repeat ECG is clearly sinus rhythm with no change in heart rate. She is on a high dose of atenolol for a patient with end-stage renal disease and her heart rate currently is within normal limits. M.D. review the data and advise on medication changes or if the patient needs to be admitted.  Signed: Theodore Demark 07/05/2011, 6:35 PM Co-Sign MD  Patient has not had any flutter All ECG;s and telemetry show NSR Ambulate.  Not postural at dialysis.  She wants to go home.  Has  Loud left carotid bruit.  Discussed with son, daughter and patient She is comfortable going home and having outpatient carotid and F/U  With me. Charlton Haws 7:56 PM 07/05/2011

## 2011-07-05 NOTE — ED Notes (Signed)
Pt. Returned from Xray 

## 2011-07-05 NOTE — ED Notes (Signed)
Patient ambulated throughout department with no difficulties, NAD noted

## 2011-07-05 NOTE — ED Notes (Signed)
Pt. To x-ray .

## 2011-07-12 ENCOUNTER — Other Ambulatory Visit (HOSPITAL_COMMUNITY): Payer: Medicare Other

## 2011-07-12 NOTE — ED Provider Notes (Signed)
Medical screening examination/treatment/procedure(s) were performed by non-physician practitioner and as supervising physician I was immediately available for consultation/collaboration.  Raeford Razor, MD 07/12/11 [phone]

## 2011-07-21 ENCOUNTER — Encounter (HOSPITAL_COMMUNITY)
Admission: EM | Disposition: A | Discharge status: Home / Self Care | Payer: Self-pay | Source: Home / Self Care | Attending: Emergency Medicine

## 2011-07-21 ENCOUNTER — Encounter (HOSPITAL_COMMUNITY): Payer: Self-pay | Admitting: Anesthesiology

## 2011-07-21 ENCOUNTER — Encounter (HOSPITAL_COMMUNITY): Payer: Self-pay | Admitting: Neurology

## 2011-07-21 ENCOUNTER — Emergency Department (HOSPITAL_COMMUNITY): Payer: Medicare Other

## 2011-07-21 ENCOUNTER — Ambulatory Visit (HOSPITAL_COMMUNITY)
Admission: EM | Admit: 2011-07-21 | Discharge: 2011-07-21 | Disposition: A | Discharge status: Home / Self Care | Payer: Medicare Other | Attending: Emergency Medicine | Admitting: Emergency Medicine

## 2011-07-21 ENCOUNTER — Emergency Department (HOSPITAL_COMMUNITY): Payer: Medicare Other | Admitting: Anesthesiology

## 2011-07-21 DIAGNOSIS — M25559 Pain in unspecified hip: Secondary | ICD-10-CM | POA: Insufficient documentation

## 2011-07-21 DIAGNOSIS — S73004A Unspecified dislocation of right hip, initial encounter: Secondary | ICD-10-CM

## 2011-07-21 DIAGNOSIS — Y92009 Unspecified place in unspecified non-institutional (private) residence as the place of occurrence of the external cause: Secondary | ICD-10-CM | POA: Insufficient documentation

## 2011-07-21 DIAGNOSIS — W19XXXA Unspecified fall, initial encounter: Secondary | ICD-10-CM | POA: Insufficient documentation

## 2011-07-21 DIAGNOSIS — I10 Essential (primary) hypertension: Secondary | ICD-10-CM | POA: Insufficient documentation

## 2011-07-21 DIAGNOSIS — Z96649 Presence of unspecified artificial hip joint: Secondary | ICD-10-CM | POA: Insufficient documentation

## 2011-07-21 DIAGNOSIS — T84029A Dislocation of unspecified internal joint prosthesis, initial encounter: Secondary | ICD-10-CM | POA: Insufficient documentation

## 2011-07-21 HISTORY — PX: HIP CLOSED REDUCTION: SHX983

## 2011-07-21 LAB — POCT I-STAT 4, (NA,K, GLUC, HGB,HCT): HCT: 37 % (ref 36.0–46.0)

## 2011-07-21 SURGERY — CLOSED REDUCTION, HIP
Anesthesia: General | Laterality: Right | Wound class: Clean

## 2011-07-21 MED ORDER — PROPOFOL 10 MG/ML IV BOLUS
0.5000 mg/kg | Freq: Once | INTRAVENOUS | Status: DC
Start: 2011-07-21 — End: 2011-07-21

## 2011-07-21 MED ORDER — PROPOFOL 10 MG/ML IV BOLUS
INTRAVENOUS | Status: AC | PRN
  Administered 2011-07-21 (×2): 15 mg via INTRAVENOUS
  Administered 2011-07-21: 200 mg via INTRAVENOUS
  Administered 2011-07-21: 30 mg via INTRAVENOUS
  Administered 2011-07-21: 15 mg via INTRAVENOUS

## 2011-07-21 MED ORDER — FENTANYL CITRATE 0.05 MG/ML IJ SOLN
INTRAMUSCULAR | Status: DC | PRN
  Administered 2011-07-21: 75 ug via INTRAVENOUS
  Administered 2011-07-21: 50 ug via INTRAVENOUS

## 2011-07-21 MED ORDER — ONDANSETRON HCL 4 MG/2ML IJ SOLN
4.0000 mg | Freq: Once | INTRAMUSCULAR | Status: AC
  Administered 2011-07-21: 4 mg via INTRAVENOUS

## 2011-07-21 MED ORDER — ONDANSETRON HCL 4 MG/2ML IJ SOLN
4.0000 mg | Freq: Once | INTRAMUSCULAR | Status: AC | PRN
  Administered 2011-07-21: 4 mg via INTRAVENOUS

## 2011-07-21 MED ORDER — SODIUM CHLORIDE 0.9 % IV SOLN
INTRAVENOUS | Status: DC | PRN
  Administered 2011-07-21: 17:00:00 via INTRAVENOUS

## 2011-07-21 MED ORDER — LIDOCAINE HCL (CARDIAC) 20 MG/ML IV SOLN
INTRAVENOUS | Status: DC | PRN
  Administered 2011-07-21: 100 mg via INTRAVENOUS

## 2011-07-21 MED ORDER — SUCCINYLCHOLINE CHLORIDE 20 MG/ML IJ SOLN
INTRAMUSCULAR | Status: DC | PRN
  Administered 2011-07-21: 100 mg via INTRAVENOUS

## 2011-07-21 MED ORDER — ONDANSETRON HCL 4 MG/2ML IJ SOLN
INTRAMUSCULAR | Status: AC
  Administered 2011-07-21: 4 mg via INTRAVENOUS
  Filled 2011-07-21: qty 2

## 2011-07-21 MED ORDER — PROMETHAZINE HCL 25 MG/ML IJ SOLN
6.2500 mg | INTRAMUSCULAR | Status: DC | PRN
  Administered 2011-07-21: 6.25 mg via INTRAVENOUS

## 2011-07-21 MED ORDER — PROPOFOL 10 MG/ML IV EMUL
INTRAVENOUS | Status: DC | PRN
  Administered 2011-07-21: 100 mg via INTRAVENOUS

## 2011-07-21 MED ORDER — PROMETHAZINE HCL 25 MG/ML IJ SOLN
INTRAMUSCULAR | Status: AC
  Administered 2011-07-21: 6.25 mg via INTRAVENOUS
  Filled 2011-07-21: qty 1

## 2011-07-21 MED ORDER — ONDANSETRON HCL 4 MG/2ML IJ SOLN
INTRAMUSCULAR | Status: AC
  Filled 2011-07-21: qty 2

## 2011-07-21 MED ORDER — HYDROMORPHONE HCL PF 1 MG/ML IJ SOLN: 0.2500 mg | INTRAMUSCULAR | Status: DC | PRN

## 2011-07-21 MED ORDER — HYDROMORPHONE HCL PF 1 MG/ML IJ SOLN
1.0000 mg | Freq: Once | INTRAMUSCULAR | Status: AC
  Administered 2011-07-21: 1 mg via INTRAVENOUS
  Filled 2011-07-21: qty 1

## 2011-07-21 SURGICAL SUPPLY — 2 items
IMMOBILIZER KNEE 22 UNIV (SOFTGOODS) ×1 IMPLANT
KIT ROOM TURNOVER OR (KITS) ×1 IMPLANT

## 2011-07-21 NOTE — ED Notes (Signed)
OR called for patient. Will be transporting patient

## 2011-07-21 NOTE — Anesthesia Preprocedure Evaluation (Addendum)
Anesthesia Evaluation  Patient identified by MRN, date of birth, ID band Patient awake    Reviewed: Allergy & Precautions, H&P , NPO status , Patient's Chart, lab work & pertinent test results, reviewed documented beta blocker date and time   Airway Mallampati: II TM Distance: <3 FB Neck ROM: Full    Dental  (+) Partial Lower and Dental Advisory Given   Pulmonary          Cardiovascular hypertension, Pt. on medications     Neuro/Psych    GI/Hepatic   Endo/Other    Renal/GU Pt on Dialysis 2nd to HTN renal disease     Musculoskeletal   Abdominal   Peds  Hematology   Anesthesia Other Findings   Reproductive/Obstetrics                          Anesthesia Physical Anesthesia Plan  ASA: III  Anesthesia Plan: General   Post-op Pain Management:    Induction: Intravenous, Rapid sequence and Cricoid pressure planned  Airway Management Planned: Oral ETT  Additional Equipment:   Intra-op Plan:   Post-operative Plan: Extubation in OR  Informed Consent: I have reviewed the patients History and Physical, chart, labs and discussed the procedure including the risks, benefits and alternatives for the proposed anesthesia with the patient or authorized representative who has indicated his/her understanding and acceptance.     Plan Discussed with: CRNA and Surgeon  Anesthesia Plan Comments:         Anesthesia Quick Evaluation

## 2011-07-21 NOTE — ED Notes (Signed)
Per ems- Pt working out in yard picking up heavy things, felt her right hip "pop out". No fall resulted. BP 140/58. Pt given 250 mcg of fentanyl with 50 mg increments and 4 mg zofran. Pt has artifical right hip. Pt has shortening and rotation of right hip noted. Dialysis pt, last was yesterday. A x 4.

## 2011-07-21 NOTE — Transfer of Care (Signed)
Immediate Anesthesia Transfer of Care Note  Patient: Christine Graham  Procedure(s) Performed: Procedure(s) (LRB): CLOSED REDUCTION HIP (Right)  Patient Location: PACU  Anesthesia Type: General  Level of Consciousness: sedated  Airway & Oxygen Therapy: Patient Spontanous Breathing and Patient connected to nasal cannula oxygen  Post-op Assessment: Report given to PACU RN and Post -op Vital signs reviewed and stable  Post vital signs: Reviewed and stable  Complications: No apparent anesthesia complications

## 2011-07-21 NOTE — ED Notes (Signed)
Pt was working out in yard today, was picking things up and felt her right hip pop, which has happened once before. Pt has artifical right hip. Positive shortening and rotation right leg. Moves extremity, pulse present. Sensation intact.

## 2011-07-21 NOTE — H&P (Signed)
Christine Graham is an 78 y.o. female.   Chief Complaint: right hip pain HPI: 78 y/o female with PMH of dementia c/o right hip pain since falling while working in her yard earlier today.  She is s/p R THA by Dr. Shelle Iron about 10 years ago.  She c/o dull aching pain in right hip that is worse with motion and better with lying still.  She has had one dislocation in the past.  No recent f/c/n/v/wt loss.  Past Medical History  Diagnosis Date  . Hyperlipidemia   . Hypertension   . Gout   . Chronic kidney disease   . Anemia     Past Surgical History  Procedure Date  . Appendectomy   . Cholecystectomy   . Total hip arthroplasty   . Breast surgery     benign breast biopsy  . Av fistula placement 10/16/2010    (R) UA AVF    Family History  Problem Relation Age of Onset  . Heart disease Father     MI  . Arthritis Mother   . Stroke Mother    Social History:  reports that she has never smoked. She does not have any smokeless tobacco history on file. She reports that she does not drink alcohol or use illicit drugs.  Allergies:  Allergies  Allergen Reactions  . Codeine Nausea Only  . Oxycodone Nausea Only     (Not in a hospital admission)  No results found for this or any previous visit (from the past 48 hour(s)). Dg Hip Complete Right  08-Aug-2011  *RADIOLOGY REPORT*  Clinical Data: Hip injury, felt right hip pop out of joint  RIGHT HIP - COMPLETE 2+ VIEW  Comparison: 12/06/2009  Findings: Status post right hip arthroplasty.  Posterior right hip dislocation.  Mild moderate generative of the left hip and lower lumbar spine.  No fracture is seen.  IMPRESSION: Status post right hip arthroplasty.  Posterior right hip dislocation.  Original Report Authenticated By: Charline Bills, M.D.    ROS  As above  Blood pressure 137/38, pulse 55, temperature 97.2 F (36.2 C), temperature source Oral, resp. rate 17, weight 61.689 kg (136 lb), SpO2 100.00%. Physical Exam elderly female in nad.   Alert and oriented to person.  EOMI.  Respirations unlabored.  2+ dp and pt pulses.  Feels LT throughout R LE.  R LE shortened and internally rotated.  5/5 strength in DF and PF of ankle.  Assessment/Plan Right hip posterior dislocation - I explained the nature of the problem to the pt and her son in detail.  I believe an attempt at reduction in the ER under sedation is appropriate.  She and her son understand the risks and benefits and would like to proceed.  After informed consent and IV sedation, I attempted closed reduction in the ER using flexion, traction and internal rotation.  This was unsuccessful.  The pt will require general anesthesia in the OR for another attempt at closed reduction.  The risks and benefits of the alternative treatment options have been discussed in detail.  The patient wishes to proceed with surgery and specifically understands risks of bleeding, infection, nerve damage, blood clots, need for additional surgery, amputation and death.   Toni Arthurs 08-08-11, 5:11 PM

## 2011-07-21 NOTE — Preoperative (Signed)
Beta Blockers   Reason not to administer Beta Blockers:Not Applicable 

## 2011-07-21 NOTE — Op Note (Signed)
NAME:  NATHANIEL, YADEN NO.:  [id number]  MEDICAL RECORD NO.:  [id number]  LOCATION:  [facility]                         FACILITY:  [facility]  PHYSICIAN:  Toni Arthurs, MD        DATE OF BIRTH:  Aug 10, 1925  DATE OF PROCEDURE:  07/21/2011 DATE OF DISCHARGE:  07/21/2011                              OPERATIVE REPORT   PREOPERATIVE DIAGNOSIS:  Right hip dislocation, status post total hip replacement.  POSTOPERATIVE DIAGNOSIS:  Right hip dislocation, status post total hip replacement.  PROCEDURES: 1. Closed reduction of dislocated right total hip replacement under     anesthesia. 2. Intraoperative interpretation of fluoroscopic imaging.  SURGEON:  Toni Arthurs, MD  ANESTHESIA:  General.  ESTIMATED BLOOD LOSS:  Zero.  COMPLICATIONS:  None apparent.  DISPOSITION:  Extubated, awake and stable to recovery.  INDICATIONS FOR PROCEDURE:  The patient is an 78 year old female who is status post right total hip replacement approximately 10 years ago by Dr. Shelle Iron.  She was working in her yard and bent forward to pick something up when she dislocated her right hip.  She was seen in the emergency department where x-rays revealed a posterior hip dislocation. She underwent attempt to closed reduction of this dislocation under general sedation in the emergency department.  She and her son understand the risks and benefits of the alternative treatment options and elected to proceed.  They specifically understand risks of failure to reduce the hip and need for operative treatment.  They also understand risks of recurrent dislocation.  PROCEDURE IN DETAIL:  After preoperative consent was obtained and the correct operative site was identified, the patient was brought to the operating room supine on a gurney.  She was transferred onto the operating table.  General anesthesia was induced.  Surgical time-out was taken.  Closed reduction of the right hip was attempted with  internal rotation, adduction of the hip, and longitudinal traction with the hip in flexion.  This was successful in reducing the hip.  AP and lateral fluoroscopic images were then obtained showing concentric reduction of the total hip replacement.  The patient was then awakened from Anesthesia after a knee immobilizer was placed on the right lower extremity.  She was transferred to the recovery room in stable condition.  FOLLOWUP PLAN:  She will be weightbearing as tolerated with a knee immobilizer on her right lower extremity.  She will follow up with Dr. Shelle Iron in the office in 1-2 weeks.     Toni Arthurs, MD     JH/MEDQ  D:  07/21/2011  T:  07/21/2011  Job:  [id number]

## 2011-07-21 NOTE — ED Notes (Signed)
Wasted 12.5 MG of propofol with Tammy Sours, Charity fundraiser. Pt was given 7.5 mg.

## 2011-07-21 NOTE — Anesthesia Procedure Notes (Signed)
Procedure Name: Intubation Date/Time: 07/21/2011 5:37 PM Performed by: Alanda Amass A Pre-anesthesia Checklist: Patient identified, Timeout performed, Emergency Drugs available, Suction available and Patient being monitored Patient Re-evaluated:Patient Re-evaluated prior to inductionOxygen Delivery Method: Circle system utilized Preoxygenation: Pre-oxygenation with 100% oxygen Intubation Type: IV induction, Cricoid Pressure applied and Rapid sequence Laryngoscope Size: Mac and 3 Grade View: Grade III Tube type: Oral Tube size: 7.0 mm Number of attempts: 1 Airway Equipment and Method: Stylet Placement Confirmation: ETT inserted through vocal cords under direct vision,  breath sounds checked- equal and bilateral and positive ETCO2 Secured at: 21 cm Tube secured with: Tape Dental Injury: Teeth and Oropharynx as per pre-operative assessment

## 2011-07-21 NOTE — ED Notes (Signed)
All belongings given to pt son. 3 rings , 2 bracelets given to pt son

## 2011-07-21 NOTE — ED Notes (Signed)
Dr. Victorino Dike unable to to place hip back in place at this time.

## 2011-07-21 NOTE — ED Notes (Signed)
Family updated as to patient's status. Ortho, DR. Victorino Dike spoke with pt son about not being able to set the hip, pt will be admitted.

## 2011-07-21 NOTE — Anesthesia Postprocedure Evaluation (Signed)
  Anesthesia Post-op Note  Patient: Christine Graham  Procedure(s) Performed: Procedure(s) (LRB): CLOSED REDUCTION HIP (Right)  Patient Location: PACU  Anesthesia Type: General  Level of Consciousness: awake  Airway and Oxygen Therapy: Patient Spontanous Breathing and Patient connected to nasal cannula oxygen  Post-op Pain: none  Post-op Assessment: Post-op Vital signs reviewed, Patient's Cardiovascular Status Stable, Respiratory Function Stable, Patent Airway and NAUSEA AND VOMITING PRESENT  Post-op Vital Signs: Reviewed and stable  Complications: No apparent anesthesia complications

## 2011-07-21 NOTE — ED Notes (Signed)
Pt had episode of nausea, vomiting. EDP and ortho at bedside setting up for conscious sedation.

## 2011-07-21 NOTE — Brief Op Note (Signed)
07/21/2011  5:50 PM  PATIENT:  Christine Graham  78 y.o. female  PRE-OPERATIVE DIAGNOSIS:  Right Hip Dislocation s/p THA  POST-OPERATIVE DIAGNOSIS:  Right Hip Dislocation s/p THA  Procedure(s): 1.  Closed reduction of dislocated right total hip replacement under anesthesia 2.  fluoro   SURGEON:  Toni Arthurs, MD  ASSISTANT: n/a  ANESTHESIA:   General  EBL:  none  TOURNIQUET:  n/a  COMPLICATIONS:  None apparent  DISPOSITION:  Extubated, awake and stable to recovery.  DICTATION ID:  [id number]

## 2011-07-23 ENCOUNTER — Encounter (HOSPITAL_COMMUNITY): Payer: Self-pay | Admitting: Orthopedic Surgery

## 2011-08-03 ENCOUNTER — Telehealth: Payer: Self-pay | Admitting: Internal Medicine

## 2011-08-03 MED ORDER — ALPRAZOLAM 0.25 MG PO TABS: 0.2500 mg | ORAL_TABLET | Freq: Two times a day (BID) | ORAL | Status: DC | PRN

## 2011-08-03 NOTE — Telephone Encounter (Signed)
rx called in

## 2011-08-03 NOTE — Telephone Encounter (Signed)
Patient's son called stating that she ran out of xanax yesterday and the pharmacy has faxed 3 requests with no response. Please assist.

## 2011-08-04 ENCOUNTER — Telehealth: Payer: Self-pay | Admitting: Internal Medicine

## 2011-08-04 MED ORDER — ALPRAZOLAM 0.25 MG PO TABS: 0.2500 mg | ORAL_TABLET | Freq: Two times a day (BID) | ORAL | Status: DC | PRN

## 2011-08-04 NOTE — Telephone Encounter (Signed)
Pharmacy called an rx corrected, pts son aware

## 2011-08-04 NOTE — Telephone Encounter (Signed)
Pt walked in says she needs Aprarazolam 0.25 mg refilled. Please call into [facility] [address] [place]

## 2011-08-04 NOTE — Addendum Note (Signed)
Addended by: Alfred Levins D on: 08/04/2011 10:43 AM   Modules accepted: Orders

## 2011-08-04 NOTE — Telephone Encounter (Signed)
Patient son called back in to say that his mom's rx was called in incorrectly. The aprazolam was called in for one tab once a day and should have been 1po 2 times qd. Please assist.

## 2011-08-04 NOTE — Telephone Encounter (Signed)
rx was called in on 08/03/11

## 2011-08-11 ENCOUNTER — Ambulatory Visit: Payer: Medicare Other | Admitting: Cardiovascular Disease

## 2011-08-16 ENCOUNTER — Other Ambulatory Visit: Payer: Self-pay | Admitting: Internal Medicine

## 2011-08-19 NOTE — ED Provider Notes (Signed)
History     CSN: [id number]  Arrival date & time 07/21/11  1310   First MD Initiated Contact with Patient 07/21/11 1330      Chief Complaint  Patient presents with  . Hip Injury    (Consider location/radiation/quality/duration/timing/severity/associated sxs/prior treatment) Patient is a 78 y.o. female presenting with hip pain. The history is provided by the patient (pt twisted and right hip became painful). No language interpreter was used.  Hip Pain This is a recurrent problem. The current episode started 1 to 2 hours ago. The problem occurs constantly. The problem has not changed since onset.Pertinent negatives include no chest pain, no abdominal pain and no headaches. The symptoms are aggravated by twisting. Nothing relieves the symptoms. The treatment provided no relief.    Past Medical History  Diagnosis Date  . Hyperlipidemia   . Hypertension   . Gout   . Chronic kidney disease   . Anemia     Past Surgical History  Procedure Date  . Appendectomy   . Cholecystectomy   . Total hip arthroplasty   . Breast surgery     benign breast biopsy  . Av fistula placement 10/16/2010    (R) UA AVF  . Hip closed reduction 07/21/2011    Procedure: CLOSED REDUCTION HIP;  Surgeon: Toni Arthurs, MD;  Location: [facility] OR;  Service: Orthopedics;  Laterality: Right;    Family History  Problem Relation Age of Onset  . Heart disease Father     MI  . Arthritis Mother   . Stroke Mother     History  Substance Use Topics  . Smoking status: Never Smoker   . Smokeless tobacco: Not on file  . Alcohol Use: No    OB History    Grav Para Term Preterm Abortions TAB SAB Ect Mult Living                  Review of Systems  Constitutional: Negative for fatigue.  HENT: Negative for congestion, sinus pressure and ear discharge.   Eyes: Negative for discharge.  Respiratory: Negative for cough.   Cardiovascular: Negative for chest pain.  Gastrointestinal: Negative for abdominal pain and  diarrhea.  Genitourinary: Negative for frequency and hematuria.  Musculoskeletal: Negative for back pain.       Right hip pain  Skin: Negative for rash.  Neurological: Negative for seizures and headaches.  Hematological: Negative.   Psychiatric/Behavioral: Negative for hallucinations.    Allergies  Codeine and Oxycodone  Home Medications   Current Outpatient Rx  Name Route Sig Dispense Refill  . ACETAMINOPHEN 325 MG PO TABS Oral Take 650 mg by mouth every 6 (six) hours as needed. For pain/fever    . AMLODIPINE BESYLATE 10 MG PO TABS Oral Take 10 mg by mouth daily.    . ASPIRIN EC 81 MG PO TBEC Oral Take 81 mg by mouth daily.    . ATENOLOL 50 MG PO TABS Oral Take 50 mg by mouth 2 (two) times daily.    . ERGOCALCIFEROL 50000 UNITS PO CAPS Oral Take 50,000 Units by mouth once a week. Take on Mondays.    Marland Kitchen HYDRALAZINE HCL 50 MG PO TABS Oral Take 50 mg by mouth 3 (three) times daily.      Marland Kitchen ONE-DAILY MULTI VITAMINS PO TABS Oral Take 1 tablet by mouth daily.      Marland Kitchen ALPRAZOLAM 0.25 MG PO TABS Oral Take 1 tablet (0.25 mg total) by mouth 2 (two) times daily as needed. For anxiety 60  tablet 3  . ATENOLOL 50 MG PO TABS  TAKE ONE TABLET BY MOUTH TWICE DAILY 180 tablet 2    BP 169/56  Pulse 61  Temp 97.4 F (36.3 C) (Oral)  Resp 19  Wt 136 lb (61.689 kg)  SpO2 98%  Physical Exam  Constitutional: She is oriented to person, place, and time. She appears well-developed.  HENT:  Head: Normocephalic and atraumatic.  Eyes: Conjunctivae and EOM are normal. No scleral icterus.  Neck: Neck supple. No thyromegaly present.  Cardiovascular: Normal rate and regular rhythm.  Exam reveals no gallop and no friction rub.   No murmur heard. Pulmonary/Chest: No stridor. She has no wheezes. She has no rales. She exhibits no tenderness.  Abdominal: She exhibits no distension. There is no tenderness. There is no rebound.  Musculoskeletal: She exhibits no edema.       Tender right hip  Lymphadenopathy:     She has no cervical adenopathy.  Neurological: She is oriented to person, place, and time. Coordination normal.  Skin: No rash noted. No erythema.  Psychiatric: She has a normal mood and affect. Her behavior is normal.    ED Course  Procedures (including critical care time)  Labs Reviewed  POCT I-STAT 4, (NA,K, GLUC, HGB,HCT) - Abnormal; Notable for the following:    Potassium 5.6 (*)     Glucose, Bld 128 (*)     All other components within normal limits  LAB REPORT - SCANNED   No results found.   1. Hip dislocation, right       MDM          Benny Lennert, MD 08/19/11 1423

## 2011-11-11 ENCOUNTER — Other Ambulatory Visit: Payer: Self-pay | Admitting: Internal Medicine

## 2011-11-11 DIAGNOSIS — Z1231 Encounter for screening mammogram for malignant neoplasm of breast: Secondary | ICD-10-CM

## 2011-12-17 ENCOUNTER — Ambulatory Visit
Admission: RE | Admit: 2011-12-17 | Discharge: 2011-12-17 | Disposition: A | Discharge status: Home / Self Care | Payer: Medicare Other | Source: Ambulatory Visit | Attending: Internal Medicine | Admitting: Internal Medicine

## 2011-12-17 DIAGNOSIS — Z1231 Encounter for screening mammogram for malignant neoplasm of breast: Secondary | ICD-10-CM

## 2012-02-22 ENCOUNTER — Other Ambulatory Visit: Payer: Self-pay | Admitting: Internal Medicine

## 2012-02-22 MED ORDER — ALPRAZOLAM 0.25 MG PO TABS: 0.2500 mg | ORAL_TABLET | Freq: Two times a day (BID) | ORAL | Status: DC | PRN

## 2012-02-22 NOTE — Telephone Encounter (Signed)
rx called in

## 2012-02-22 NOTE — Telephone Encounter (Signed)
Pt has appt on 04-04-2012. Pt is requesting refill on alprazolam call into [facility] on [address]

## 2012-03-22 ENCOUNTER — Other Ambulatory Visit: Payer: Self-pay | Admitting: Internal Medicine

## 2012-03-29 ENCOUNTER — Telehealth: Payer: Self-pay | Admitting: Internal Medicine

## 2012-03-29 ENCOUNTER — Other Ambulatory Visit: Payer: Self-pay | Admitting: Internal Medicine

## 2012-03-29 MED ORDER — AMLODIPINE BESYLATE 10 MG PO TABS: 10.0000 mg | ORAL_TABLET | Freq: Every day | ORAL | Status: AC

## 2012-03-29 NOTE — Telephone Encounter (Signed)
1 refill given enough till appt ok per Dr Cato Mulligan.  rx sent in electronically

## 2012-03-29 NOTE — Telephone Encounter (Signed)
Pt has appt sch for 05-17-12. Pt need refill on amlodipine call into [facility] [address]

## 2012-04-04 ENCOUNTER — Ambulatory Visit: Payer: Medicare Other | Admitting: Internal Medicine

## 2012-04-07 ENCOUNTER — Ambulatory Visit: Payer: Medicare Other | Admitting: Internal Medicine

## 2012-05-01 ENCOUNTER — Other Ambulatory Visit: Payer: Self-pay | Admitting: Internal Medicine

## 2012-05-17 ENCOUNTER — Encounter: Payer: Self-pay | Admitting: Internal Medicine

## 2012-05-17 ENCOUNTER — Telehealth: Payer: Self-pay | Admitting: Internal Medicine

## 2012-05-17 ENCOUNTER — Ambulatory Visit (INDEPENDENT_AMBULATORY_CARE_PROVIDER_SITE_OTHER): Payer: Medicare Other | Admitting: Internal Medicine

## 2012-05-17 VITALS — BP 162/60 | HR 64 | Temp 100.0°F | Wt 133.0 lb

## 2012-05-17 DIAGNOSIS — I129 Hypertensive chronic kidney disease with stage 1 through stage 4 chronic kidney disease, or unspecified chronic kidney disease: Secondary | ICD-10-CM

## 2012-05-17 DIAGNOSIS — I10 Essential (primary) hypertension: Secondary | ICD-10-CM

## 2012-05-17 DIAGNOSIS — E785 Hyperlipidemia, unspecified: Secondary | ICD-10-CM

## 2012-05-17 MED ORDER — ROSUVASTATIN CALCIUM 20 MG PO TABS: 20.0000 mg | ORAL_TABLET | Freq: Every day | ORAL | Status: DC

## 2012-05-17 NOTE — Telephone Encounter (Signed)
Patient would like a generic of Crestor to be called into her pharmacy, it is very expensive. Please send to [facility] on [address]. Thank you

## 2012-05-17 NOTE — Progress Notes (Signed)
htn-- no home bps but tolerating meds  GOUT- no recurrence  Lipids-- tolerating meds  Renal failure secondary to htn-- has regular f/u with nephrology- she is evaluated at dialysis by nephrology monthly. Labs are drawn there.   Past Medical History  Diagnosis Date  . Hyperlipidemia   . Hypertension   . Gout   . Chronic kidney disease   . Anemia     History   Social History  . Marital Status: Widowed    Spouse Name: N/A    Number of Children: N/A  . Years of Education: N/A   Occupational History  . Not on file.   Social History Main Topics  . Smoking status: Never Smoker   . Smokeless tobacco: Not on file  . Alcohol Use: No  . Drug Use: No  . Sexually Active:    Other Topics Concern  . Not on file   Social History Narrative  . No narrative on file    Past Surgical History  Procedure Laterality Date  . Appendectomy    . Cholecystectomy    . Total hip arthroplasty    . Breast surgery      benign breast biopsy  . Av fistula placement  10/16/2010    (R) UA AVF  . Hip closed reduction  07/21/2011    Procedure: CLOSED REDUCTION HIP;  Surgeon: Toni Arthurs, MD;  Location: [facility] OR;  Service: Orthopedics;  Laterality: Right;    Family History  Problem Relation Age of Onset  . Heart disease Father     MI  . Arthritis Mother   . Stroke Mother     Allergies  Allergen Reactions  . Codeine Nausea Only  . Oxycodone Nausea Only    Current Outpatient Prescriptions on File Prior to Visit  Medication Sig Dispense Refill  . acetaminophen (TYLENOL) 325 MG tablet Take 650 mg by mouth every 6 (six) hours as needed. For pain/fever      . ALPRAZolam (XANAX) 0.25 MG tablet Take 1 tablet (0.25 mg total) by mouth 2 (two) times daily as needed. For anxiety  60 tablet  1  . amLODipine (NORVASC) 10 MG tablet Take 1 tablet (10 mg total) by mouth daily.  90 tablet  0  . aspirin EC 81 MG tablet Take 81 mg by mouth daily.      Marland Kitchen atenolol (TENORMIN) 50 MG tablet TAKE ONE TABLET BY  MOUTH TWICE DAILY  180 tablet  2  . ergocalciferol (VITAMIN D2) 50000 UNITS capsule Take 50,000 Units by mouth once a week. Take on Mondays.      . hydrALAZINE (APRESOLINE) 50 MG tablet Take 50 mg by mouth 3 (three) times daily.        . Multiple Vitamin (MULTIVITAMIN) tablet Take 1 tablet by mouth daily.         No current facility-administered medications on file prior to visit.     patient denies chest pain, shortness of breath, orthopnea. Denies lower extremity edema, abdominal pain, change in appetite, change in bowel movements. Patient denies rashes, musculoskeletal complaints. No other specific complaints in a complete review of systems.   There were no vitals taken for this visit.   Well-developed well-nourished female in no acute distress. HEENT exam atraumatic, normocephalic, extraocular muscles are intact. Neck is supple. No jugular venous distention no thyromegaly. Chest clear to auscultation without increased work of breathing. Cardiac exam S1 and S2 are regular. Abdominal exam active bowel sounds, soft, nontender. Extremities no edema. Neurologic exam she is  alert without any motor sensory deficits. Gait is normal.

## 2012-05-17 NOTE — Telephone Encounter (Signed)
Crestor is not generic.  Pt can pick up samples.  Unable to leave message on machine cause mailbox is full.  Samples up front for her to p/u

## 2012-05-17 NOTE — Assessment & Plan Note (Signed)
She is evaluated by nephrology and bp is regulated by nephrology

## 2012-05-18 NOTE — Assessment & Plan Note (Signed)
She has f/u with nephrology

## 2012-05-18 NOTE — Assessment & Plan Note (Signed)
i will get labs from nephrology No need to check labs today

## 2012-06-12 ENCOUNTER — Telehealth: Payer: Self-pay | Admitting: Internal Medicine

## 2012-06-12 MED ORDER — ALPRAZOLAM 0.25 MG PO TABS: 0.2500 mg | ORAL_TABLET | Freq: Two times a day (BID) | ORAL | Status: AC | PRN

## 2012-06-12 NOTE — Telephone Encounter (Signed)
PT stopped by today to request a refill of her ALPRAZolam (XANAX) 0.25 MG tablet. She takes 1 po bid. She states that she would like it called into [facility] on [address]. Please assist.

## 2012-06-12 NOTE — Telephone Encounter (Signed)
Ok per Dr Cato Mulligan, rx called in to [facility]

## 2012-06-30 ENCOUNTER — Ambulatory Visit (INDEPENDENT_AMBULATORY_CARE_PROVIDER_SITE_OTHER): Payer: Medicare Other | Admitting: Internal Medicine

## 2012-06-30 ENCOUNTER — Encounter: Payer: Self-pay | Admitting: Internal Medicine

## 2012-06-30 ENCOUNTER — Telehealth: Payer: Self-pay | Admitting: Internal Medicine

## 2012-06-30 VITALS — BP 155/60 | HR 60 | Temp 98.3°F | Wt 131.0 lb

## 2012-06-30 DIAGNOSIS — I10 Essential (primary) hypertension: Secondary | ICD-10-CM

## 2012-06-30 DIAGNOSIS — I129 Hypertensive chronic kidney disease with stage 1 through stage 4 chronic kidney disease, or unspecified chronic kidney disease: Secondary | ICD-10-CM

## 2012-06-30 DIAGNOSIS — F411 Generalized anxiety disorder: Secondary | ICD-10-CM

## 2012-06-30 NOTE — Progress Notes (Signed)
Chief Complaint  Patient presents with  . Hypertension    Checked at dialysis at this morning.  183/68    HPI: Patient comes in today for SDA for  Urgent  problem evaluation. PCP NA  Called [facility] who made appt because of Bp elevation. [name] has a long-standing history of hypertension in fact has end-stage hypertensive renal disease on dialysis for the last year. However she worries about her blood pressure has had side effects with others in the past has been on the current regimen for while including amlodipine hydralazine and beta blocker.   She always asks at dialysis what her blood pressure is and was told her blood pressure was in the 180s today. That concerned her and she called the on-call service. She does have a monitor at home uncertain how often she takes the reading. thinbks that the amlopine  isn working she gets occasional dizziness and notices that amlodipine can cause dizziness when she looks at the package insert    She does take get anxious at times she takes Xanax every morning. She has her history of low blood pressure with dialysis at one time and holds her blood pressure medicines on the days of dialysis which are Tuesday Thursday and Saturdays  She has a nephrologist but states that she doesn't see him on a regular basis and instead Dr. Cato Mulligan manages the blood pressure. . She lives alone has outlived 2 husbands has a close friend who helps drive her and manage getting her to where she needs to go and following direction.  No history of falling chest pain shortness of breath at this time ROS: See pertinent positives and negatives per HPI.  Last office visit Dr. Cato Mulligan was in April  Past Medical History  Diagnosis Date  . Hyperlipidemia   . Hypertension   . Gout   . Chronic kidney disease   . Anemia     Family History  Problem Relation Age of Onset  . Heart disease Father     MI  . Arthritis Mother   . Stroke Mother     History   Social History  .  Marital Status: Widowed    Spouse Name: N/A    Number of Children: N/A  . Years of Education: N/A   Social History Main Topics  . Smoking status: Never Smoker   . Smokeless tobacco: None  . Alcohol Use: No  . Drug Use: No  . Sexually Active:    Other Topics Concern  . None   Social History Narrative  . None    Outpatient Encounter Prescriptions as of 06/30/2012  Medication Sig Dispense Refill  . acetaminophen (TYLENOL) 325 MG tablet Take 650 mg by mouth every 6 (six) hours as needed. For pain/fever      . ALPRAZolam (XANAX) 0.25 MG tablet Take 1 tablet (0.25 mg total) by mouth 2 (two) times daily as needed. For anxiety  60 tablet  3  . amLODipine (NORVASC) 10 MG tablet Take 1 tablet (10 mg total) by mouth daily.  90 tablet  0  . aspirin EC 81 MG tablet Take 81 mg by mouth daily.      Marland Kitchen atenolol (TENORMIN) 50 MG tablet TAKE ONE TABLET BY MOUTH TWICE DAILY  180 tablet  2  . ergocalciferol (VITAMIN D2) 50000 UNITS capsule Take 50,000 Units by mouth once a week. Take on Mondays.      . hydrALAZINE (APRESOLINE) 50 MG tablet Take 50 mg by mouth 3 (three)  times daily.        . Multiple Vitamin (MULTIVITAMIN) tablet Take 1 tablet by mouth daily.        . rosuvastatin (CRESTOR) 20 MG tablet Take 1 tablet (20 mg total) by mouth daily.  90 tablet  3   No facility-administered encounter medications on file as of 06/30/2012.    EXAM:  BP 155/60  Pulse 60  Temp(Src) 98.3 F (36.8 C) (Oral)  Wt 131 lb (59.421 kg)  BMI 20.51 kg/m2  SpO2 96%  Body mass index is 20.51 kg/(m^2). Delightful  alert well-groomed lady in no acute distress GENERAL: vitals reviewed and listed above, alert, oriented, appears well hydrated and in no acute distress she appears younger than her stated age appears oriented alert mildly anxious. Blood pressure was repeated after she uncrossed her legs and sat for a while.  HEENT: atraumatic, conjunctiva  clear, no obvious abnormalities on inspection of external nose  and ears  NECK: no obvious masses on inspection palpation no JVD is seen  LUNGS: clear to auscultation bilaterally, no wheezes, rales or rhonchi, breath sounds appear equal  CV: HRRR, where she does have an occasional premature beat weight about 60 no clubbing cyanosis or  peripheral edema nl cap refill   MS: moves all extremities without noticeable focal  abnormality ambulatory independent. Shunt covered on the right upper arm blood pressure taken on the left arm PSYCH: pleasant and cooperative,  ASSESSMENT AND PLAN:  Discussed the following assessment and plan:  Hypertensive renal failure  HYPERTENSION  ANXIETY STATE NOS Indeed her blood pressure is elevated but nowhere near as much as reported to: Nurse after sitting and relaxing. At her age and medical status I would not change her blood pressure medications today. On an acute visit at a level of 155. We will send our note to Dr. Allena Katz and Dr. Cato Mulligan and arrange followup with Dr. Cato Mulligan. They may want to change her medication I will have her check her readings and bring her monitor in. I am uncertain what her goal blood pressure is at this time  I think there is some confusion about who is responsible for which disease condition and medication. Last creatinines and labs in the flow sheet are a year ago.  I told the patient there many medications we could add but this seems to be a chronic problem would be best managed by her primary care and renal team. We wouldn't want her to have syncope from orthostatic changes in her blood pressure. Would also have her monitor her pulse ;it does run on the low side  she is on 50 twice a day beta blocker. -Patient advised to return or notify health care team  if symptoms worsen or persist or new concerns arise.  Patient Instructions  Your blood pressure after resting is much better .  At this time i think it may be dangerous if we add too much medication however  The medications can be adjusted      If up all the time.   Fo now no change in medication  I will let Dr Cato Mulligan know and have  You get a follow up appt with  June    Check your readings     After resting sitting and your aprazolam   About 3 x per week   Stay on the same meds for now .  We can consider changing medication directed by Dr swords if he thinks fit over the phone .  Bring in your monitor again  At fu visit .   Any bp med can cause dizziness as well as other  Problem s.   How to Take Your Blood Pressure  These instructions are only for electronic home blood pressure machines. You will need:   An automatic or semi-automatic blood pressure machine.  Fresh batteries for the blood pressure machine. HOW DO I USE THESE TOOLS TO CHECK MY BLOOD PRESSURE?   There are 2 numbers that make up your blood pressure. For example: 120/80.  The first number (120 in our example) is called the "systolic pressure." It is a measure of the pressure in your blood vessels when your heart is pumping blood.  The second number (80 in our example) is called the "diastolic pressure." It is a measure of the pressure in your blood vessels when your heart is resting between beats.  Before you buy a home blood pressure machine, check the size of your arm so you can buy the right size cuff. Here is how to check the size of your arm:  Use a tape measure that shows both inches and centimeters.  Wrap the tape measure around the middle upper part of your arm. You may need someone to help you measure right.  Write down your arm measurement in both inches and centimeters.  To measure your blood pressure right, it is important to have the right size cuff.  If your arm is up to 13 inches (37 to 34 centimeters), get an adult cuff size.  If your arm is 13 to 17 inches (35 to 44 centimeters), get a large adult cuff size.  If your arm is 17 to 20 inches (45 to 52 centimeters), get an adult thigh cuff.  Try to rest or relax for at least 30  minutes before you check your blood pressure.  Do not smoke.  Do not have any drinks with caffeine, such as:  Pop.  Coffee.  Tea.  Check your blood pressure in a quiet room.  Sit down and stretch out your arm on a table. Keep your arm at about the level of your heart. Let your arm relax. GETTING BLOOD PRESSURE READINGS  Make sure you remove any tight-fighting clothing from your arm. Wrap the cuff around your upper arm. Wrap it just above the bend, and above where you felt the pulse. You should be able to slip a finger between the cuff and your arm. If you cannot slip a finger in the cuff, it is too tight and should be removed and rewrapped.  Some units requires you to manually pump up the arm cuff.  Automatic units inflate the cuff when you press a button.  Cuff deflation is automatic in both models.  After the cuff is inflated, the unit measures your blood pressure and pulse. The readings are displayed on a monitor. Hold still and breathe normally while the cuff is inflated.  Getting a reading takes less than a minute.  Some models store readings in a memory. Some provide a printout of readings.  Get readings at different times of the day. You should wait at least 5 minutes between readings. Take readings with you to your next doctor's visit. Document Released: 01/08/2008 Document Revised: 04/19/2011 Document Reviewed: 01/08/2008 [facility] Patient Information 2014 [place], [place].      Neta Mends. Yvonnia Tango M.D.

## 2012-06-30 NOTE — Telephone Encounter (Signed)
Patient Information:  Caller Name: Kaely  Phone: [phone]  Patient: Christine Graham  Gender: Female  DOB: Mar 16, 1925  Age: 79 Years  PCP: Birdie Sons (Adults only)  Office Follow Up:  Does the office need to follow up with this patient?: No  Instructions For The Office: N/A  RN Note:  Pt is wanting to come to office today for BP medication adjustment  Symptoms  Reason For Call & Symptoms: Pt is calling and states that BP at 10:15am  today was 183/68; her BP at 0900 today was 199/68; asymptomatic; friend is with pt now;  pt states taht BP has been increasing for the last couple of months  Reviewed Health History In EMR: Yes  Reviewed Medications In EMR: Yes  Reviewed Allergies In EMR: Yes  Reviewed Surgeries / Procedures: Yes  Date of Onset of Symptoms: Unknown  Guideline(s) Used:  High Blood Pressure  Disposition Per Guideline:   See Today in Office  Reason For Disposition Reached:   BP > 180/110  Advice Given:  Call Back If:  You become worse.  Patient Will Follow Care Advice:  YES  Appointment Scheduled:  06/30/2012 11:15:00 Appointment Scheduled Provider:  Berniece Andreas [facility])

## 2012-06-30 NOTE — Patient Instructions (Addendum)
Your blood pressure after resting is much better .  At this time i think it may be dangerous if we add too much medication however  The medications can be adjusted     If up all the time.   Fo now no change in medication  I will let Dr Cato Mulligan know and have  You get a follow up appt with  June    Check your readings     After resting sitting and your aprazolam   About 3 x per week   Stay on the same meds for now .  We can consider changing medication directed by Dr swords if he thinks fit over the phone .    Bring in your monitor again  At fu visit .   Any bp med can cause dizziness as well as other  Problem s.   How to Take Your Blood Pressure  These instructions are only for electronic home blood pressure machines. You will need:   An automatic or semi-automatic blood pressure machine.  Fresh batteries for the blood pressure machine. HOW DO I USE THESE TOOLS TO CHECK MY BLOOD PRESSURE?   There are 2 numbers that make up your blood pressure. For example: 120/80.  The first number (120 in our example) is called the "systolic pressure." It is a measure of the pressure in your blood vessels when your heart is pumping blood.  The second number (80 in our example) is called the "diastolic pressure." It is a measure of the pressure in your blood vessels when your heart is resting between beats.  Before you buy a home blood pressure machine, check the size of your arm so you can buy the right size cuff. Here is how to check the size of your arm:  Use a tape measure that shows both inches and centimeters.  Wrap the tape measure around the middle upper part of your arm. You may need someone to help you measure right.  Write down your arm measurement in both inches and centimeters.  To measure your blood pressure right, it is important to have the right size cuff.  If your arm is up to 13 inches (37 to 34 centimeters), get an adult cuff size.  If your arm is 13 to 17 inches (35 to 44  centimeters), get a large adult cuff size.  If your arm is 17 to 20 inches (45 to 52 centimeters), get an adult thigh cuff.  Try to rest or relax for at least 30 minutes before you check your blood pressure.  Do not smoke.  Do not have any drinks with caffeine, such as:  Pop.  Coffee.  Tea.  Check your blood pressure in a quiet room.  Sit down and stretch out your arm on a table. Keep your arm at about the level of your heart. Let your arm relax. GETTING BLOOD PRESSURE READINGS  Make sure you remove any tight-fighting clothing from your arm. Wrap the cuff around your upper arm. Wrap it just above the bend, and above where you felt the pulse. You should be able to slip a finger between the cuff and your arm. If you cannot slip a finger in the cuff, it is too tight and should be removed and rewrapped.  Some units requires you to manually pump up the arm cuff.  Automatic units inflate the cuff when you press a button.  Cuff deflation is automatic in both models.  After the cuff is inflated, the unit  measures your blood pressure and pulse. The readings are displayed on a monitor. Hold still and breathe normally while the cuff is inflated.  Getting a reading takes less than a minute.  Some models store readings in a memory. Some provide a printout of readings.  Get readings at different times of the day. You should wait at least 5 minutes between readings. Take readings with you to your next doctor's visit. Document Released: 01/08/2008 Document Revised: 04/19/2011 Document Reviewed: 01/08/2008 [facility] Patient Information 2014 [place], [place].

## 2012-07-04 ENCOUNTER — Inpatient Hospital Stay (HOSPITAL_COMMUNITY): Payer: Medicare Other

## 2012-07-04 ENCOUNTER — Encounter (HOSPITAL_COMMUNITY): Payer: Self-pay | Admitting: *Deleted

## 2012-07-04 ENCOUNTER — Emergency Department (HOSPITAL_COMMUNITY): Payer: Medicare Other

## 2012-07-04 ENCOUNTER — Inpatient Hospital Stay (HOSPITAL_COMMUNITY)
Admission: EM | Admit: 2012-07-04 | Discharge: 2012-08-02 | DRG: 207 | Disposition: A | Discharge status: Skilled Nursing Facility | Payer: Medicare Other | Attending: Internal Medicine | Admitting: Internal Medicine

## 2012-07-04 DIAGNOSIS — F411 Generalized anxiety disorder: Secondary | ICD-10-CM

## 2012-07-04 DIAGNOSIS — IMO0002 Reserved for concepts with insufficient information to code with codable children: Secondary | ICD-10-CM | POA: Diagnosis not present

## 2012-07-04 DIAGNOSIS — R1313 Dysphagia, pharyngeal phase: Secondary | ICD-10-CM | POA: Diagnosis not present

## 2012-07-04 DIAGNOSIS — H6691 Otitis media, unspecified, right ear: Secondary | ICD-10-CM

## 2012-07-04 DIAGNOSIS — D72829 Elevated white blood cell count, unspecified: Secondary | ICD-10-CM

## 2012-07-04 DIAGNOSIS — G9340 Encephalopathy, unspecified: Secondary | ICD-10-CM | POA: Diagnosis present

## 2012-07-04 DIAGNOSIS — E876 Hypokalemia: Secondary | ICD-10-CM | POA: Diagnosis not present

## 2012-07-04 DIAGNOSIS — R490 Dysphonia: Secondary | ICD-10-CM | POA: Diagnosis not present

## 2012-07-04 DIAGNOSIS — N186 End stage renal disease: Secondary | ICD-10-CM | POA: Diagnosis present

## 2012-07-04 DIAGNOSIS — B9789 Other viral agents as the cause of diseases classified elsewhere: Secondary | ICD-10-CM | POA: Diagnosis present

## 2012-07-04 DIAGNOSIS — R29898 Other symptoms and signs involving the musculoskeletal system: Secondary | ICD-10-CM | POA: Diagnosis present

## 2012-07-04 DIAGNOSIS — R7309 Other abnormal glucose: Secondary | ICD-10-CM | POA: Diagnosis not present

## 2012-07-04 DIAGNOSIS — T361X5A Adverse effect of cephalosporins and other beta-lactam antibiotics, initial encounter: Secondary | ICD-10-CM | POA: Diagnosis present

## 2012-07-04 DIAGNOSIS — R061 Stridor: Secondary | ICD-10-CM | POA: Diagnosis not present

## 2012-07-04 DIAGNOSIS — I5033 Acute on chronic diastolic (congestive) heart failure: Secondary | ICD-10-CM | POA: Diagnosis not present

## 2012-07-04 DIAGNOSIS — R0600 Dyspnea, unspecified: Secondary | ICD-10-CM

## 2012-07-04 DIAGNOSIS — M109 Gout, unspecified: Secondary | ICD-10-CM | POA: Diagnosis present

## 2012-07-04 DIAGNOSIS — Z992 Dependence on renal dialysis: Secondary | ICD-10-CM

## 2012-07-04 DIAGNOSIS — J811 Chronic pulmonary edema: Principal | ICD-10-CM | POA: Diagnosis present

## 2012-07-04 DIAGNOSIS — R1032 Left lower quadrant pain: Secondary | ICD-10-CM

## 2012-07-04 DIAGNOSIS — R05 Cough: Secondary | ICD-10-CM

## 2012-07-04 DIAGNOSIS — G92 Toxic encephalopathy: Secondary | ICD-10-CM | POA: Diagnosis not present

## 2012-07-04 DIAGNOSIS — I10 Essential (primary) hypertension: Secondary | ICD-10-CM | POA: Diagnosis present

## 2012-07-04 DIAGNOSIS — R197 Diarrhea, unspecified: Secondary | ICD-10-CM | POA: Diagnosis present

## 2012-07-04 DIAGNOSIS — S1983XD Other specified injuries of vocal cord, subsequent encounter: Secondary | ICD-10-CM

## 2012-07-04 DIAGNOSIS — R404 Transient alteration of awareness: Secondary | ICD-10-CM | POA: Diagnosis not present

## 2012-07-04 DIAGNOSIS — N2581 Secondary hyperparathyroidism of renal origin: Secondary | ICD-10-CM | POA: Diagnosis present

## 2012-07-04 DIAGNOSIS — J9589 Other postprocedural complications and disorders of respiratory system, not elsewhere classified: Secondary | ICD-10-CM

## 2012-07-04 DIAGNOSIS — S1983XA Other specified injuries of vocal cord, initial encounter: Secondary | ICD-10-CM

## 2012-07-04 DIAGNOSIS — IMO0001 Reserved for inherently not codable concepts without codable children: Secondary | ICD-10-CM

## 2012-07-04 DIAGNOSIS — S1983XS Other specified injuries of vocal cord, sequela: Secondary | ICD-10-CM

## 2012-07-04 DIAGNOSIS — J9600 Acute respiratory failure, unspecified whether with hypoxia or hypercapnia: Secondary | ICD-10-CM | POA: Diagnosis not present

## 2012-07-04 DIAGNOSIS — Z96649 Presence of unspecified artificial hip joint: Secondary | ICD-10-CM

## 2012-07-04 DIAGNOSIS — R0602 Shortness of breath: Secondary | ICD-10-CM | POA: Insufficient documentation

## 2012-07-04 DIAGNOSIS — Z66 Do not resuscitate: Secondary | ICD-10-CM | POA: Diagnosis not present

## 2012-07-04 DIAGNOSIS — I5031 Acute diastolic (congestive) heart failure: Secondary | ICD-10-CM | POA: Diagnosis present

## 2012-07-04 DIAGNOSIS — R112 Nausea with vomiting, unspecified: Secondary | ICD-10-CM | POA: Diagnosis present

## 2012-07-04 DIAGNOSIS — E785 Hyperlipidemia, unspecified: Secondary | ICD-10-CM | POA: Diagnosis present

## 2012-07-04 DIAGNOSIS — G40401 Other generalized epilepsy and epileptic syndromes, not intractable, with status epilepticus: Secondary | ICD-10-CM | POA: Diagnosis not present

## 2012-07-04 DIAGNOSIS — Z8673 Personal history of transient ischemic attack (TIA), and cerebral infarction without residual deficits: Secondary | ICD-10-CM

## 2012-07-04 DIAGNOSIS — T380X5A Adverse effect of glucocorticoids and synthetic analogues, initial encounter: Secondary | ICD-10-CM | POA: Diagnosis not present

## 2012-07-04 DIAGNOSIS — E873 Alkalosis: Secondary | ICD-10-CM | POA: Diagnosis not present

## 2012-07-04 DIAGNOSIS — J90 Pleural effusion, not elsewhere classified: Secondary | ICD-10-CM | POA: Diagnosis not present

## 2012-07-04 DIAGNOSIS — H6690 Otitis media, unspecified, unspecified ear: Secondary | ICD-10-CM | POA: Diagnosis not present

## 2012-07-04 DIAGNOSIS — I509 Heart failure, unspecified: Secondary | ICD-10-CM

## 2012-07-04 DIAGNOSIS — I120 Hypertensive chronic kidney disease with stage 5 chronic kidney disease or end stage renal disease: Secondary | ICD-10-CM | POA: Diagnosis present

## 2012-07-04 DIAGNOSIS — R1319 Other dysphagia: Secondary | ICD-10-CM | POA: Diagnosis not present

## 2012-07-04 DIAGNOSIS — G929 Unspecified toxic encephalopathy: Secondary | ICD-10-CM | POA: Diagnosis not present

## 2012-07-04 DIAGNOSIS — I4891 Unspecified atrial fibrillation: Secondary | ICD-10-CM

## 2012-07-04 DIAGNOSIS — D638 Anemia in other chronic diseases classified elsewhere: Secondary | ICD-10-CM | POA: Diagnosis present

## 2012-07-04 DIAGNOSIS — J387 Other diseases of larynx: Secondary | ICD-10-CM | POA: Diagnosis not present

## 2012-07-04 DIAGNOSIS — J189 Pneumonia, unspecified organism: Secondary | ICD-10-CM | POA: Diagnosis present

## 2012-07-04 DIAGNOSIS — R579 Shock, unspecified: Secondary | ICD-10-CM | POA: Diagnosis not present

## 2012-07-04 DIAGNOSIS — G40901 Epilepsy, unspecified, not intractable, with status epilepticus: Secondary | ICD-10-CM | POA: Diagnosis not present

## 2012-07-04 DIAGNOSIS — I129 Hypertensive chronic kidney disease with stage 1 through stage 4 chronic kidney disease, or unspecified chronic kidney disease: Secondary | ICD-10-CM

## 2012-07-04 DIAGNOSIS — R059 Cough, unspecified: Secondary | ICD-10-CM

## 2012-07-04 DIAGNOSIS — Y849 Medical procedure, unspecified as the cause of abnormal reaction of the patient, or of later complication, without mention of misadventure at the time of the procedure: Secondary | ICD-10-CM | POA: Diagnosis not present

## 2012-07-04 DIAGNOSIS — R5381 Other malaise: Secondary | ICD-10-CM | POA: Diagnosis not present

## 2012-07-04 DIAGNOSIS — E43 Unspecified severe protein-calorie malnutrition: Secondary | ICD-10-CM | POA: Diagnosis not present

## 2012-07-04 DIAGNOSIS — R0989 Other specified symptoms and signs involving the circulatory and respiratory systems: Secondary | ICD-10-CM

## 2012-07-04 HISTORY — DX: End stage renal disease: N18.6

## 2012-07-04 LAB — CBC WITH DIFFERENTIAL/PLATELET
Basophils Absolute: 0 10*3/uL (ref 0.0–0.1)
Eosinophils Relative: 3 % (ref 0–5)
HCT: 35.6 % — ABNORMAL LOW (ref 36.0–46.0)
Lymphocytes Relative: 15 % (ref 12–46)
MCHC: 34.6 g/dL (ref 30.0–36.0)
MCV: 93.7 fL (ref 78.0–100.0)
Monocytes Absolute: 0.7 10*3/uL (ref 0.1–1.0)
Monocytes Relative: 5 % (ref 3–12)
RDW: 15.4 % (ref 11.5–15.5)
WBC: 13.9 10*3/uL — ABNORMAL HIGH (ref 4.0–10.5)

## 2012-07-04 LAB — CREATININE, SERUM
GFR calc Af Amer: 17 mL/min — ABNORMAL LOW (ref >90)
GFR calc non Af Amer: 15 mL/min — ABNORMAL LOW (ref >90)

## 2012-07-04 LAB — CBC
HCT: 38.2 % (ref 36.0–46.0)
MCV: 94.6 fL (ref 78.0–100.0)
RBC: 4.04 MIL/uL (ref 3.87–5.11)
RDW: 15.4 % (ref 11.5–15.5)
WBC: 11.5 10*3/uL — ABNORMAL HIGH (ref 4.0–10.5)

## 2012-07-04 LAB — COMPREHENSIVE METABOLIC PANEL
AST: 21 U/L (ref 0–37)
BUN: 37 mg/dL — ABNORMAL HIGH (ref 6–23)
CO2: 25 mEq/L (ref 19–32)
Calcium: 8.6 mg/dL (ref 8.4–10.5)
Creatinine, Ser: 5.39 mg/dL — ABNORMAL HIGH (ref 0.50–1.10)
GFR calc Af Amer: 8 mL/min — ABNORMAL LOW (ref >90)
GFR calc non Af Amer: 7 mL/min — ABNORMAL LOW (ref >90)

## 2012-07-04 LAB — TSH: TSH: 4.339 u[IU]/mL (ref 0.350–4.500)

## 2012-07-04 LAB — TROPONIN I: Troponin I: <0.3 ng/mL (ref <0.30)

## 2012-07-04 LAB — PRO B NATRIURETIC PEPTIDE: Pro B Natriuretic peptide (BNP): 45870 pg/mL — ABNORMAL HIGH (ref 0–450)

## 2012-07-04 MED ORDER — ALPRAZOLAM 0.25 MG PO TABS: 0.2500 mg | ORAL_TABLET | Freq: Two times a day (BID) | ORAL | Status: DC | PRN

## 2012-07-04 MED ORDER — VANCOMYCIN HCL 500 MG IV SOLR
500.0000 mg | INTRAVENOUS | Status: DC
  Administered 2012-07-06: 500 mg via INTRAVENOUS
  Filled 2012-07-04: qty 500

## 2012-07-04 MED ORDER — ONDANSETRON HCL 4 MG PO TABS: 4.0000 mg | ORAL_TABLET | Freq: Four times a day (QID) | ORAL | Status: DC | PRN

## 2012-07-04 MED ORDER — ISOSORB DINITRATE-HYDRALAZINE 20-37.5 MG PO TABS
1.0000 | ORAL_TABLET | Freq: Two times a day (BID) | ORAL | Status: DC
  Administered 2012-07-04 – 2012-07-05 (×2): 1 via ORAL
  Filled 2012-07-04 (×5): qty 1

## 2012-07-04 MED ORDER — ONDANSETRON HCL 4 MG/2ML IJ SOLN: 4.0000 mg | Freq: Once | INTRAMUSCULAR | Status: AC

## 2012-07-04 MED ORDER — PIPERACILLIN-TAZOBACTAM 3.375 G IVPB: 3.3750 g | Freq: Once | INTRAVENOUS | Status: DC

## 2012-07-04 MED ORDER — DEXTROSE 5 % IV SOLN
1.0000 g | Freq: Three times a day (TID) | INTRAVENOUS | Status: DC
  Administered 2012-07-04 – 2012-07-05 (×3): 1 g via INTRAVENOUS
  Filled 2012-07-04 (×4): qty 1

## 2012-07-04 MED ORDER — FAMOTIDINE 40 MG PO TABS
40.0000 mg | ORAL_TABLET | Freq: Every day | ORAL | Status: DC
  Administered 2012-07-04 – 2012-07-05 (×2): 40 mg via ORAL
  Filled 2012-07-04 (×2): qty 1

## 2012-07-04 MED ORDER — ONDANSETRON HCL 4 MG/2ML IJ SOLN
INTRAMUSCULAR | Status: AC
  Administered 2012-07-04: 4 mg via INTRAVENOUS
  Filled 2012-07-04: qty 2

## 2012-07-04 MED ORDER — ACETAMINOPHEN 650 MG RE SUPP: 650.0000 mg | Freq: Four times a day (QID) | RECTAL | Status: DC | PRN

## 2012-07-04 MED ORDER — ASPIRIN EC 81 MG PO TBEC
81.0000 mg | DELAYED_RELEASE_TABLET | Freq: Every day | ORAL | Status: DC
  Administered 2012-07-04 – 2012-07-05 (×2): 81 mg via ORAL
  Filled 2012-07-04 (×3): qty 1

## 2012-07-04 MED ORDER — ADULT MULTIVITAMIN W/MINERALS CH
1.0000 | ORAL_TABLET | Freq: Every day | ORAL | Status: DC
  Administered 2012-07-04 – 2012-07-05 (×2): 1 via ORAL
  Filled 2012-07-04 (×3): qty 1

## 2012-07-04 MED ORDER — VANCOMYCIN HCL IN DEXTROSE 1-5 GM/200ML-% IV SOLN
1000.0000 mg | Freq: Once | INTRAVENOUS | Status: AC
  Administered 2012-07-04: 1000 mg via INTRAVENOUS
  Filled 2012-07-04: qty 200

## 2012-07-04 MED ORDER — DEXTROSE 5 % IV SOLN
2.0000 g | INTRAVENOUS | Status: DC
  Filled 2012-07-04: qty 2

## 2012-07-04 MED ORDER — SODIUM CHLORIDE 0.9 % IJ SOLN: 3.0000 mL | INTRAMUSCULAR | Status: DC | PRN

## 2012-07-04 MED ORDER — ONDANSETRON HCL 4 MG/2ML IJ SOLN: 4.0000 mg | Freq: Four times a day (QID) | INTRAMUSCULAR | Status: DC | PRN

## 2012-07-04 MED ORDER — AMLODIPINE BESYLATE 10 MG PO TABS
10.0000 mg | ORAL_TABLET | Freq: Every day | ORAL | Status: DC
  Administered 2012-07-04 – 2012-07-05 (×2): 10 mg via ORAL
  Filled 2012-07-04 (×3): qty 1

## 2012-07-04 MED ORDER — ONDANSETRON HCL 4 MG/2ML IJ SOLN: 4.0000 mg | Freq: Three times a day (TID) | INTRAMUSCULAR | Status: DC | PRN

## 2012-07-04 MED ORDER — ATORVASTATIN CALCIUM 40 MG PO TABS
40.0000 mg | ORAL_TABLET | Freq: Every day | ORAL | Status: DC
  Administered 2012-07-04 – 2012-07-10 (×5): 40 mg via ORAL
  Filled 2012-07-04 (×8): qty 1

## 2012-07-04 MED ORDER — ACETAMINOPHEN 325 MG PO TABS: 650.0000 mg | ORAL_TABLET | Freq: Four times a day (QID) | ORAL | Status: DC | PRN

## 2012-07-04 MED ORDER — POTASSIUM CHLORIDE CRYS ER 20 MEQ PO TBCR: 30.0000 meq | EXTENDED_RELEASE_TABLET | Freq: Once | ORAL | Status: DC

## 2012-07-04 MED ORDER — ATENOLOL 50 MG PO TABS
50.0000 mg | ORAL_TABLET | Freq: Every day | ORAL | Status: DC
  Administered 2012-07-04 – 2012-07-05 (×2): 50 mg via ORAL
  Filled 2012-07-04 (×3): qty 1

## 2012-07-04 MED ORDER — DOXERCALCIFEROL 4 MCG/2ML IV SOLN
2.0000 ug | INTRAVENOUS | Status: DC
  Administered 2012-07-04 – 2012-07-29 (×3): 2 ug via INTRAVENOUS
  Filled 2012-07-04 (×13): qty 2

## 2012-07-04 MED ORDER — DEXTROSE 5 % IV SOLN
1.0000 g | Freq: Once | INTRAVENOUS | Status: AC
  Administered 2012-07-04: 1 g via INTRAVENOUS
  Filled 2012-07-04: qty 1

## 2012-07-04 MED ORDER — SODIUM CHLORIDE 0.9 % IV SOLN
250.0000 mL | INTRAVENOUS | Status: DC | PRN
  Administered 2012-07-09: 250 mL via INTRAVENOUS

## 2012-07-04 MED ORDER — ENOXAPARIN SODIUM 40 MG/0.4ML ~~LOC~~ SOLN
40.0000 mg | SUBCUTANEOUS | Status: DC
  Administered 2012-07-04: 40 mg via SUBCUTANEOUS
  Filled 2012-07-04 (×2): qty 0.4

## 2012-07-04 MED ORDER — SODIUM CHLORIDE 0.9 % IJ SOLN
3.0000 mL | Freq: Two times a day (BID) | INTRAMUSCULAR | Status: DC
  Administered 2012-07-05 – 2012-07-13 (×12): 3 mL via INTRAVENOUS
  Administered 2012-07-14: 15 mL via INTRAVENOUS
  Administered 2012-07-14: 3 mL via INTRAVENOUS
  Administered 2012-07-15: 15 mL via INTRAVENOUS
  Administered 2012-07-15 – 2012-07-17 (×3): 3 mL via INTRAVENOUS

## 2012-07-04 MED ORDER — SODIUM CHLORIDE 0.9 % IJ SOLN
3.0000 mL | Freq: Two times a day (BID) | INTRAMUSCULAR | Status: DC
  Administered 2012-07-06 – 2012-07-10 (×8): 3 mL via INTRAVENOUS
  Administered 2012-07-11: 09:00:00 via INTRAVENOUS
  Administered 2012-07-11 – 2012-07-13 (×5): 3 mL via INTRAVENOUS
  Administered 2012-07-14: 15 mL via INTRAVENOUS
  Administered 2012-07-14: 3 mL via INTRAVENOUS
  Administered 2012-07-15: 15 mL via INTRAVENOUS
  Administered 2012-07-16 – 2012-07-18 (×5): 3 mL via INTRAVENOUS
  Administered 2012-07-19: 12:00:00 via INTRAVENOUS
  Administered 2012-07-19 – 2012-08-02 (×26): 3 mL via INTRAVENOUS

## 2012-07-04 NOTE — ED Notes (Signed)
Per EMS: pt from home. pt was SOB this morning (pt scheduled for dialysis this morning) pt complaining of nausea as well. Pt denies CP or any other pain. Pt alert and oriented.

## 2012-07-04 NOTE — Progress Notes (Signed)
RT note: sputum collection device placed in pts. room, explained, and states understanding.

## 2012-07-04 NOTE — ED Notes (Signed)
Report given to dialysis- pt will be transported there to have dialysis and then to floor.

## 2012-07-04 NOTE — ED Provider Notes (Signed)
History     CSN: [id number]  Arrival date & time 07/04/12  4098   First MD Initiated Contact with Patient 07/04/12 0701      No chief complaint on file.   (Consider location/radiation/quality/duration/timing/severity/associated sxs/prior treatment) HPI Comments: Patient with history of ESRD on HD followed by Dr. Allena Katz.Marland Kitchen  Presents with complaints of worsening shortness of breath for the past two weeks and suddenly became worse today.  She denies any chest pain, cough, or fever.  She does not believe she is above her dry weight.    Patient is a 79 y.o. female presenting with shortness of breath. The history is provided by the patient.  Shortness of Breath Severity:  Moderate Onset quality:  Gradual Duration:  2 weeks Timing:  Constant Progression:  Worsening Chronicity:  New Context: activity   Relieved by:  Nothing Worsened by:  Activity and exertion Ineffective treatments:  None tried Associated symptoms: no chest pain, no cough and no fever     Past Medical History  Diagnosis Date  . Hyperlipidemia   . Hypertension   . Gout   . Chronic kidney disease   . Anemia     Past Surgical History  Procedure Laterality Date  . Appendectomy    . Cholecystectomy    . Total hip arthroplasty    . Breast surgery      benign breast biopsy  . Av fistula placement  10/16/2010    (R) UA AVF  . Hip closed reduction  07/21/2011    Procedure: CLOSED REDUCTION HIP;  Surgeon: Toni Arthurs, MD;  Location: [facility] OR;  Service: Orthopedics;  Laterality: Right;    Family History  Problem Relation Age of Onset  . Heart disease Father     MI  . Arthritis Mother   . Stroke Mother     History  Substance Use Topics  . Smoking status: Never Smoker   . Smokeless tobacco: Not on file  . Alcohol Use: No    OB History   Grav Para Term Preterm Abortions TAB SAB Ect Mult Living                  Review of Systems  Constitutional: Negative for fever.  Respiratory: Positive for shortness of  breath. Negative for cough.   Cardiovascular: Negative for chest pain.  All other systems reviewed and are negative.    Allergies  Codeine and Oxycodone  Home Medications   Current Outpatient Rx  Name  Route  Sig  Dispense  Refill  . acetaminophen (TYLENOL) 325 MG tablet   Oral   Take 650 mg by mouth every 6 (six) hours as needed. For pain/fever         . ALPRAZolam (XANAX) 0.25 MG tablet   Oral   Take 1 tablet (0.25 mg total) by mouth 2 (two) times daily as needed. For anxiety   60 tablet   3   . amLODipine (NORVASC) 10 MG tablet   Oral   Take 1 tablet (10 mg total) by mouth daily.   90 tablet   0     NEEDS OV   . amoxicillin (AMOXIL) 500 MG capsule   Oral   Take 500 mg by mouth 4 (four) times daily.         Marland Kitchen aspirin EC 81 MG tablet   Oral   Take 81 mg by mouth daily.         Marland Kitchen atenolol (TENORMIN) 50 MG tablet   Oral  Take 50 mg by mouth daily.         . Multiple Vitamin (MULTIVITAMIN WITH MINERALS) TABS   Oral   Take 1 tablet by mouth daily.         . rosuvastatin (CRESTOR) 20 MG tablet   Oral   Take 20 mg by mouth daily.           BP 202/65  Pulse 65  Temp(Src) 98.4 F (36.9 C) (Oral)  Resp 16  SpO2 92%  Physical Exam  Nursing note and vitals reviewed. Constitutional: She is oriented to person, place, and time. She appears well-developed and well-nourished. No distress.  HENT:  Head: Normocephalic and atraumatic.  Mouth/Throat: Oropharynx is clear and moist.  Neck: Normal range of motion. Neck supple.  Cardiovascular: Normal rate, regular rhythm and normal heart sounds.   No murmur heard. Pulmonary/Chest: Effort normal. No respiratory distress. She has no wheezes. She has rales.  There are rales in the bases bilaterally.  Abdominal: Soft. Bowel sounds are normal. She exhibits no distension. There is no tenderness.  Musculoskeletal: Normal range of motion. She exhibits no edema.  Lymphadenopathy:    She has no cervical  adenopathy.  Neurological: She is alert and oriented to person, place, and time.  Skin: Skin is warm and dry. She is not diaphoretic.    ED Course  Procedures (including critical care time)  Labs Reviewed  CBC WITH DIFFERENTIAL  COMPREHENSIVE METABOLIC PANEL  PRO B NATRIURETIC PEPTIDE   No results found.   No diagnosis found.   Date: 07/04/2012  Rate: 64  Rhythm: normal sinus rhythm  QRS Axis: normal  Intervals: normal  ST/T Wave abnormalities: normal  Conduction Disutrbances:none  Narrative Interpretation:   Old EKG Reviewed: none available    MDM  The patient presents with complaints of dyspnea that appears to be multifactorial.  In discussion with Dr. Eliott Nine, she has informed me the patient is compliant with her dialysis and never presents for dialysis significantly above her dry weight.  Her concerns are that there could be some volume overload as well as some other process, possibly pneumonia or a cardiac issue.  I have added a troponin.  Dr. Elza Rafter recommendations are for admission to medicine for dialysis and further workup.  I have spoken with Dr. Alferd Patee from [facility] who agrees to admit the patient.  He has concerns for pneumonia an would like to obtain blood cultures, then start antibiotics to cover for hcap.  These orders have been entered and I have asked the nurse to be sure draw the cultures before giving the antibiotics.  She will admitted to the medicine service.          Geoffery Lyons, MD 07/04/12 [phone]

## 2012-07-04 NOTE — ED Notes (Signed)
Pt reports woke up today with shortness of breath and nausea. Shortness of breath has been going on for approx 2 weeks, but worse today. Due for dialysis this am. Denies pain. Reports non- productive cough off and on for a couple of weeks. No fevers at home. Denies swelling to legs.

## 2012-07-04 NOTE — Consult Note (Addendum)
Reason for Consult: Provision of dialysis and management of anemia, sec HPT and ESRD related issues Referring Physician: EDP HPI: Christine Graham is an 79 y.o. female who undergoes HD at the [facility] on TTS.  Presented to the ED today with complaints of shortness of breath.  According to the dialysis unit nurses pt had been complaining to them of nausea for 1-2 weeks, and pt says this was accompanied by loose stools,  but "did not want anything done about it".  Has been getting to dry weight of 59 kg last couple of treatments.  Treatment terminated early last Thursday after just a couple of hours.  Did HD on Saturday. Denies chest pain or pressure, has had some lightheadedness, no abdominal pain but stools loose.  Nausea has been her biggest complaint until onset of worsening dyspnea.  Dialyzes at [facility] on TTS  Primary Nephrologist Eliott Nine EDW 59 kg HD Bath 2K 2.5 Ca Dialyzer F160 Heparin 2000 Access AVF R upper Hectorol 2 TIW EPO 2200  Past Medical History  Diagnosis Date  . Hyperlipidemia   . Hypertension   . Gout   . Chronic kidney disease   . Anemia     MAllergies:  Allergies  Allergen Reactions  . Codeine Nausea Only  . Oxycodone Nausea Only    Past Surgical History  Procedure Laterality Date  . Appendectomy    . Cholecystectomy    . Total hip arthroplasty    . Breast surgery      benign breast biopsy  . Av fistula placement  10/16/2010    (R) UA AVF  . Hip closed reduction  07/21/2011    Procedure: CLOSED REDUCTION HIP;  Surgeon: Toni Arthurs, MD;  Location: [facility] OR;  Service: Orthopedics;  Laterality: Right;    Family History  Problem Relation Age of Onset  . Heart disease Father     MI  . Arthritis Mother   . Stroke Mother     Social History:  reports that she has never smoked. She does not have any smokeless tobacco history on file. She reports that she does not drink alcohol or use illicit drugs. Widowed but has a significant other ("my dancing  partner") ROS: Nausea for 2 weeks, loose stools for several days, no vomiting or melena or hematochezia.  Some dizziness, no syncope or near syncope.  No cough, chills. Not sure if any fever.  Dyspneic for several days despite getting dialysis and getting to EDW. Otherwise per HPI  Blood pressure 179/63, pulse 59, temperature 98.4 F (36.9 C), temperature source Oral, resp. rate 20, SpO2 100.00%. Physical Examination: VS as above. Pleasant elderly woman. On O2. Able to stand for weight.  Some incr WOB Bilat crackles posteriorly Regular S1 S2 no S3  1/6 M USB Abd + BS No focal tenderness 1+pedal edema but minimal pre tibial edema Distal pulses intact  LABS Results for orders placed during the hospital encounter of 07/04/12 (from the past 48 hour(s))  CBC WITH DIFFERENTIAL     Status: Abnormal   Collection Time    07/04/12  7:24 AM      Result Value Range   WBC 13.9 (*) 4.0 - 10.5 K/uL   RBC 3.80 (*) 3.87 - 5.11 MIL/uL   Hemoglobin 12.3  12.0 - 15.0 g/dL   HCT 16.1 (*) 09.6 - 04.5 %   MCV 93.7  78.0 - 100.0 fL   MCH 32.4  26.0 - 34.0 pg   MCHC 34.6  30.0 - 36.0  g/dL   RDW 16.1  09.6 - 04.5 %   Platelets 284  150 - 400 K/uL   Neutrophils Relative % 77  43 - 77 %   Neutro Abs 10.7 (*) 1.7 - 7.7 K/uL   Lymphocytes Relative 15  12 - 46 %   Lymphs Abs 2.1  0.7 - 4.0 K/uL   Monocytes Relative 5  3 - 12 %   Monocytes Absolute 0.7  0.1 - 1.0 K/uL   Eosinophils Relative 3  0 - 5 %   Eosinophils Absolute 0.4  0.0 - 0.7 K/uL   Basophils Relative 0  0 - 1 %   Basophils Absolute 0.0  0.0 - 0.1 K/uL  COMPREHENSIVE METABOLIC PANEL     Status: Abnormal   Collection Time    07/04/12  7:24 AM      Result Value Range   Sodium 134 (*) 135 - 145 mEq/L   Potassium 3.2 (*) 3.5 - 5.1 mEq/L   Chloride 93 (*) 96 - 112 mEq/L   CO2 25  19 - 32 mEq/L   Glucose, Bld 112 (*) 70 - 99 mg/dL   BUN 37 (*) 6 - 23 mg/dL   Creatinine, Ser 4.09 (*) 0.50 - 1.10 mg/dL   Calcium 8.6  8.4 - 81.1 mg/dL   Total  Protein 7.9  6.0 - 8.3 g/dL   Albumin 3.6  3.5 - 5.2 g/dL   AST 21  0 - 37 U/L   ALT 15  0 - 35 U/L   Alkaline Phosphatase 96  39 - 117 U/L   Total Bilirubin 0.4  0.3 - 1.2 mg/dL   GFR calc non Af Amer 7 (*) >90 mL/min   GFR calc Af Amer 8 (*) >90 mL/min   Comment:            The eGFR has been calculated     using the CKD EPI equation.     This calculation has not been     validated in all clinical     situations.     eGFR's persistently     <90 mL/min signify     possible Chronic Kidney Disease.  PRO B NATRIURETIC PEPTIDE     Status: Abnormal   Collection Time    07/04/12  7:24 AM      Result Value Range   Pro B Natriuretic peptide (BNP) 45870.0 (*) 0 - 450 pg/mL    Dg Chest 2 View  07/04/2012   *RADIOLOGY REPORT*  Clinical Data: Shortness of breath and cough.  CHEST - 2 VIEW  Comparison: PA and lateral chest 07/05/2011.  Findings: The patient has small bilateral pleural effusions, larger on the left.  Bilateral airspace disease also appears worse on the left.  Heart size is upper normal.  No pneumothorax.  IMPRESSION: Bilateral airspace disease and small effusions, worse on the left, are likely due to asymmetric edema rather than pneumonia.   Original Report Authenticated By: Holley Dexter, M.D.    Assessment/Plan: 1  Dyspnea - pulm edema +/-  PNA; does have small WBC elevation. Volume removal will help sort out to some degree. Appears could tolerate reduction in EDW (although her CXR findings are a bit disproportionate to her degree of peripheral edema) ; should get troponins and echo. (If cards needed has seen Dr. Eden Emms in the past). CXR post HD. 2 ESRD: Usual TTS - HD urgently this AM for volume removal. 4K bath.  KDur 30 po as well. 3 Hypertension: BP  up now.  Remove volume.  Need updated med list. 4. Anemia of ESRD: EPO has been at 2200 but Hb 12.3 so will hold for now 5. 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 No binders as phos usually in range. 6. Nausea/diarrhea -  etiology not clear. Check LFT's, amylase and lipase.  Check stools for CDiff.   [name] B 07/04/2012, 9:12 AM

## 2012-07-04 NOTE — Progress Notes (Signed)
ANTIBIOTIC CONSULT NOTE - INITIAL  Pharmacy Consult for cefepime and vancomycin Indication: rule out pneumonia  Allergies  Allergen Reactions  . Codeine Nausea Only  . Oxycodone Nausea Only    Patient Measurements: Height: 5' 6.93" (170 cm) Weight: 123 lb 10.9 oz (56.1 kg) (standing) IBW/kg (Calculated) : 61.44 Adjusted Body Weight:   Vital Signs: Temp: 98 F (36.7 C) (05/27 1752) Temp src: Oral (05/27 1752) BP: 172/71 mmHg (05/27 1752) Pulse Rate: 64 (05/27 1752) Intake/Output from previous day:   Intake/Output from this shift:    Labs:  Recent Labs  07/04/12 0724 07/04/12 1645  WBC 13.9* 11.5*  HGB 12.3 13.0  PLT 284 242  CREATININE 5.39* 2.77*   Estimated Creatinine Clearance: 13.2 ml/min (by C-G formula based on Cr of 2.77). No results found for this basename: VANCOTROUGH, VANCOPEAK, VANCORANDOM, GENTTROUGH, GENTPEAK, GENTRANDOM, TOBRATROUGH, TOBRAPEAK, TOBRARND, AMIKACINPEAK, AMIKACINTROU, AMIKACIN,  in the last 72 hours   Microbiology: No results found for this or any previous visit (from the past 720 hour(s)).  Medical History: Past Medical History  Diagnosis Date  . Hyperlipidemia   . Hypertension   . Gout   . Chronic kidney disease   . Anemia     Medications:  Scheduled:  . amLODipine  10 mg Oral Daily  . aspirin EC  81 mg Oral Daily  . atenolol  50 mg Oral Daily  . atorvastatin  40 mg Oral q1800  . ceFEPime (MAXIPIME) IV  1 g Intravenous Q8H  . [START ON 07/06/2012] ceFEPime (MAXIPIME) IV  2 g Intravenous Q T,Th,Sa-HD  . doxercalciferol  2 mcg Intravenous Q T,Th,Sa-HD  . enoxaparin (LOVENOX) injection  40 mg Subcutaneous Q24H  . famotidine  40 mg Oral Daily  . isosorbide-hydrALAZINE  1 tablet Oral BID  . multivitamin with minerals  1 tablet Oral Daily  . piperacillin-tazobactam (ZOSYN)  IV  3.375 g Intravenous Once  . potassium chloride  30 mEq Oral Once  . sodium chloride  3 mL Intravenous Q12H  . sodium chloride  3 mL Intravenous Q12H   . [START ON 07/06/2012] vancomycin  500 mg Intravenous Q T,Th,Sa-HD   Assessment: 79 yr old female presented at the ED with SOB following a cough for 2 weeks. The pt has a history of ESRD on HD, HTN, HLD, AOCD. Empiric treatment of HCAP was begun with Vancomycin 1 Gm and cefepime 1 gm. Pharmacy is now to dose.    Plan:  1) Cefepime 2 Gm after each HD. Pt had 1 Gm in the ED so I added another Gm to make a total of a 2 GM dose.   2) Ordered vancomycin 500 mg with each HD. Pt received 1 Gm in the ED.  Eugene Garnet 07/04/2012,9:12 PM

## 2012-07-04 NOTE — ED Notes (Signed)
Patient transported to X-ray 

## 2012-07-04 NOTE — Procedures (Signed)
I have personally attended this patient's dialysis session.  Initiating HD on 4K bath with UF goal of 3-3.5 kg d/t CHF. Acess is right AVF no problems.  Weight pre HD was 58.8 so below EDW of 59. Therefore comfortable that EDW needs to come down/cbd    Nahom Carfagno B

## 2012-07-04 NOTE — H&P (Signed)
[facility] History and Physical  Christine Graham YNW:[id number] DOB: 1925-07-24 DOA: 07/04/2012  Referring physician: Dr. Judd Lien PCP: Judie Petit, MD  Cardiologist (Dr. Eden Emms)  Chief Complaint: SOB, cough  HPI: Christine Graham is a 79 y.o. female with pmh of ESRD, HTN, HLD, AOCD; came to ED complaining of increased SOB and non productive cough. Patient reprots symptoms has been present for the last 1-2 weeks and initially associated with nausea/vomiting and loose stools; now no further nausea or vomiting, but still with loose stool. She is also complaining of general malaise, weakness and increase SOB and cough. Patient denies CP, fever, chills, dysuria, melena, hematochezia and hematemesis. In ED CXR demonstrating bilateral infiltrates (concers for PNA vs edema); BNP is elevated and had WBC's in the 14,000 range.   Review of Systems:  Negative except as otherwise mentioned on HPI.  Past Medical History  Diagnosis Date  . Hyperlipidemia   . Hypertension   . Gout   . Chronic kidney disease   . Anemia    Past Surgical History  Procedure Laterality Date  . Appendectomy    . Cholecystectomy    . Total hip arthroplasty    . Breast surgery      benign breast biopsy  . Av fistula placement  10/16/2010    (R) UA AVF  . Hip closed reduction  07/21/2011    Procedure: CLOSED REDUCTION HIP;  Surgeon: Toni Arthurs, MD;  Location: [facility] OR;  Service: Orthopedics;  Laterality: Right;   Social History:  reports that she has never smoked. She does not have any smokeless tobacco history on file. She reports that she does not drink alcohol or use illicit drugs. Lives at home with husband; no significant assistance needed with ADL's according to patient.  Allergies  Allergen Reactions  . Codeine Nausea Only  . Oxycodone Nausea Only    Family History  Problem Relation Age of Onset  . Heart disease Father     MI  . Arthritis Mother   . Stroke Mother     Prior to Admission  medications   Medication Sig Start Date End Date Taking? Authorizing Provider  acetaminophen (TYLENOL) 325 MG tablet Take 650 mg by mouth every 6 (six) hours as needed. For pain/fever   Yes Historical Provider, MD  ALPRAZolam (XANAX) 0.25 MG tablet Take 1 tablet (0.25 mg total) by mouth 2 (two) times daily as needed. For anxiety 06/12/12 06/12/13 Yes Bruce Romilda Garret, MD  amLODipine (NORVASC) 10 MG tablet Take 1 tablet (10 mg total) by mouth daily. 03/29/12  Yes Lindley Magnus, MD  amoxicillin (AMOXIL) 500 MG capsule Take 500 mg by mouth 4 (four) times daily.   Yes Historical Provider, MD  aspirin EC 81 MG tablet Take 81 mg by mouth daily.   Yes Historical Provider, MD  atenolol (TENORMIN) 50 MG tablet Take 50 mg by mouth daily.   Yes Historical Provider, MD  Multiple Vitamin (MULTIVITAMIN WITH MINERALS) TABS Take 1 tablet by mouth daily.   Yes Historical Provider, MD  rosuvastatin (CRESTOR) 20 MG tablet Take 20 mg by mouth daily.   Yes Historical Provider, MD   Physical Exam: Filed Vitals:   07/04/12 1230 07/04/12 1300 07/04/12 1330 07/04/12 1407  BP: 169/49 165/54 151/55 166/59  Pulse: 59 61 71 68  Temp:      TempSrc:      Resp: 24 22 31 23   Weight:    56.1 kg (123 lb 10.9 oz)  SpO2:  96%     General:  Feeling better after HD; cooperative with exama nd really hungry.  Eyes: PERRL, no icterus, no nystagmus, EOMI  ENT: moist MM, no erythema or exudates inside her mouth, no drainage out of ears or nostrils  Neck: no thyromegaly, no bruits  Cardiovascular: S1 and S2, no rubs or gallops, positive soft murmur (appears diastolic in nature); positive JVD  Respiratory: no wheezing, scattered rhonchi; no crackles (exam done after HD)  Abdomen: soft, NT, ND, positive BS  Skin: no rash or petechiae  Musculoskeletal: no joint sweelling or erythema, positive 1+ pedal edema  Psychiatric: appropriate  Neurologic: AAOX3, normal CN, no focal motor or sensory deficit; normal finger to  nose  Labs on Admission:  Basic Metabolic Panel:  Recent Labs Lab 07/04/12 0724  NA 134*  K 3.2*  CL 93*  CO2 25  GLUCOSE 112*  BUN 37*  CREATININE 5.39*  CALCIUM 8.6   Liver Function Tests:  Recent Labs Lab 07/04/12 0724  AST 21  ALT 15  ALKPHOS 96  BILITOT 0.4  PROT 7.9  ALBUMIN 3.6    Recent Labs Lab 07/04/12 1008  LIPASE 79*  AMYLASE 70   CBC:  Recent Labs Lab 07/04/12 0724  WBC 13.9*  NEUTROABS 10.7*  HGB 12.3  HCT 35.6*  MCV 93.7  PLT 284   Cardiac Enzymes:  Recent Labs Lab 07/04/12 0930  TROPONINI <0.30    BNP (last 3 results)  Recent Labs  07/04/12 0724  PROBNP 45870.0*    Radiological Exams on Admission: Dg Chest 2 View  07/04/2012   *RADIOLOGY REPORT*  Clinical Data: Bilateral airspace disease versus edema check for improvement following dialysis  CHEST - 2 VIEW  Comparison: 07/04/2012 07:43 hours  Findings: Cardiac shadow is stable.  There is some left basilar infiltrate stable in appearance.  There has been some improvement in the degree of central vascular congestion when compared with the prior exam.  IMPRESSION: Improved appearance consistent with edema improvement following dialysis. There are persistent bibasilar changes worst on the left.   Original Report Authenticated By: Alcide Clever, M.D.   Dg Chest 2 View  07/04/2012   *RADIOLOGY REPORT*  Clinical Data: Shortness of breath and cough.  CHEST - 2 VIEW  Comparison: PA and lateral chest 07/05/2011.  Findings: The patient has small bilateral pleural effusions, larger on the left.  Bilateral airspace disease also appears worse on the left.  Heart size is upper normal.  No pneumothorax.  IMPRESSION: Bilateral airspace disease and small effusions, worse on the left, are likely due to asymmetric edema rather than pneumonia.   Original Report Authenticated By: Holley Dexter, M.D.    EKG:  Date: 07/04/2012  Rate: 64  Rhythm: normal sinus rhythm  QRS Axis: normal  Intervals:  normal  ST/T Wave abnormalities: normal  Conduction Disutrbances:none  Narrative Interpretation:  Old EKG Reviewed: none available  Assessment/Plan 1-SOB (shortness of breath): with elevation on WBC's and also BNP. Per family patient with GI symptoms and also non productive cough for the las week or so; according to them symptoms are worsening. -Will admit to telemetry -cycle CE'z -check 2-D echo -HD for volume control -daily ASA and b-blocker to be continued. -empiric treatment for HCAP (cefepime and vanc) -patient reprots still make some urine (will get strep and legionella antigen -oxygen supplementation -repeat CXR in am.  2-ESRD: continue HD. Renal service consulted. Will follow rec's  3-HYPERLIPIDEMIA: will check lipid profile; continue statins  4-GOUT: no flares at  this moment. Will use PRN pain meds if needed  5-HYPERTENSION:elevated. Will continue home regimen, but will add bidil for better control if heart failure is present will be most likely diastolic in nature and BP controlled will help situation.  6-Leukocytosis, unspecified and cough: probably secondary to #1; will start empiric abx's and follow trend. PRN robitussin for cough.  7-Nausea/vomiting: lipase mildly elevated, but normal amylase and no abdominal pain. Will check C.diff; but symptoms could be secondary to PNA and/or gastroenteritis. -provide supportive care and symptomatic treatment while waiting test results.  8-weakness and general malaise: will ask PT to see.  DVT: lovenox.  Renal service (Dr. Eliott Nine)   Code Status: Full Family Communication: husband and son at bedside Disposition Plan: home when medically stable  Time spent: >30 minutes  Lamari Beckles [facility] Pager [phone]  If 7PM-7AM, please contact night-coverage [email or web address] Password [facility] 07/04/2012, 4:07 PM

## 2012-07-05 ENCOUNTER — Inpatient Hospital Stay (HOSPITAL_COMMUNITY): Payer: Medicare Other

## 2012-07-05 ENCOUNTER — Telehealth: Payer: Self-pay | Admitting: Internal Medicine

## 2012-07-05 DIAGNOSIS — I319 Disease of pericardium, unspecified: Secondary | ICD-10-CM

## 2012-07-05 LAB — COMPREHENSIVE METABOLIC PANEL
ALT: 13 U/L (ref 0–35)
AST: 19 U/L (ref 0–37)
Albumin: 3.4 g/dL — ABNORMAL LOW (ref 3.5–5.2)
Alkaline Phosphatase: 85 U/L (ref 39–117)
BUN: 33 mg/dL — ABNORMAL HIGH (ref 6–23)
CO2: 26 mEq/L (ref 19–32)
Calcium: 8.8 mg/dL (ref 8.4–10.5)
Chloride: 95 mEq/L — ABNORMAL LOW (ref 96–112)
Creatinine, Ser: 4.74 mg/dL — ABNORMAL HIGH (ref 0.50–1.10)
GFR calc Af Amer: 9 mL/min — ABNORMAL LOW (ref >90)
GFR calc non Af Amer: 8 mL/min — ABNORMAL LOW (ref >90)
Glucose, Bld: 122 mg/dL — ABNORMAL HIGH (ref 70–99)
Potassium: 4 mEq/L (ref 3.5–5.1)
Sodium: 135 mEq/L (ref 135–145)
Total Bilirubin: 0.4 mg/dL (ref 0.3–1.2)
Total Protein: 7.8 g/dL (ref 6.0–8.3)

## 2012-07-05 LAB — CBC WITH DIFFERENTIAL/PLATELET
Basophils Absolute: 0 10*3/uL (ref 0.0–0.1)
Basophils Relative: 0 % (ref 0–1)
Eosinophils Absolute: 0.3 10*3/uL (ref 0.0–0.7)
Eosinophils Relative: 2 % (ref 0–5)
HCT: 35 % — ABNORMAL LOW (ref 36.0–46.0)
Hemoglobin: 12 g/dL (ref 12.0–15.0)
Lymphocytes Relative: 16 % (ref 12–46)
Lymphs Abs: 2.7 10*3/uL (ref 0.7–4.0)
MCH: 32.5 pg (ref 26.0–34.0)
MCHC: 34.3 g/dL (ref 30.0–36.0)
MCV: 94.9 fL (ref 78.0–100.0)
Monocytes Absolute: 0.8 10*3/uL (ref 0.1–1.0)
Monocytes Relative: 5 % (ref 3–12)
Neutro Abs: 12.9 10*3/uL — ABNORMAL HIGH (ref 1.7–7.7)
Neutrophils Relative %: 77 % (ref 43–77)
Platelets: 264 10*3/uL (ref 150–400)
RBC: 3.69 MIL/uL — ABNORMAL LOW (ref 3.87–5.11)
RDW: 15.3 % (ref 11.5–15.5)
WBC: 16.7 10*3/uL — ABNORMAL HIGH (ref 4.0–10.5)

## 2012-07-05 LAB — RENAL FUNCTION PANEL
CO2: 28 mEq/L (ref 19–32)
Calcium: 8 mg/dL — ABNORMAL LOW (ref 8.4–10.5)
GFR calc Af Amer: 11 mL/min — ABNORMAL LOW (ref >90)
GFR calc non Af Amer: 9 mL/min — ABNORMAL LOW (ref >90)
Phosphorus: 2.9 mg/dL (ref 2.3–4.6)
Potassium: 3.6 mEq/L (ref 3.5–5.1)
Sodium: 138 mEq/L (ref 135–145)

## 2012-07-05 LAB — STREP PNEUMONIAE URINARY ANTIGEN: Strep Pneumo Urinary Antigen: NEGATIVE

## 2012-07-05 LAB — CBC
MCHC: 33.2 g/dL (ref 30.0–36.0)
Platelets: 262 10*3/uL (ref 150–400)
RDW: 15.6 % — ABNORMAL HIGH (ref 11.5–15.5)
WBC: 10.3 10*3/uL (ref 4.0–10.5)

## 2012-07-05 LAB — LIPID PANEL
HDL: 31 mg/dL — ABNORMAL LOW (ref >39)
LDL Cholesterol: 52 mg/dL (ref 0–99)
Total CHOL/HDL Ratio: 3.4 RATIO

## 2012-07-05 LAB — TROPONIN I
Troponin I: <0.3 ng/mL (ref <0.30)
Troponin I: <0.3 ng/mL (ref <0.30)

## 2012-07-05 MED ORDER — DIAZEPAM 5 MG/ML IJ SOLN
INTRAMUSCULAR | Status: AC
  Administered 2012-07-05: 5 mg via INTRAVENOUS
  Filled 2012-07-05: qty 2

## 2012-07-05 MED ORDER — LORAZEPAM 2 MG/ML IJ SOLN
INTRAMUSCULAR | Status: AC
  Administered 2012-07-05: 1 mg via INTRAVENOUS
  Filled 2012-07-05: qty 1

## 2012-07-05 MED ORDER — LORAZEPAM 2 MG/ML IJ SOLN: 1.0000 mg | Freq: Once | INTRAMUSCULAR | Status: AC

## 2012-07-05 MED ORDER — DARBEPOETIN ALFA-POLYSORBATE 25 MCG/0.42ML IJ SOLN
25.0000 ug | INTRAMUSCULAR | Status: DC
  Filled 2012-07-05 (×2): qty 0.42

## 2012-07-05 MED ORDER — DIAZEPAM 5 MG/ML IJ SOLN: 5.0000 mg | Freq: Once | INTRAMUSCULAR | Status: AC

## 2012-07-05 NOTE — Progress Notes (Signed)
Utilization review completed.  

## 2012-07-05 NOTE — Progress Notes (Signed)
Subjective:    Objective Vital signs in last 24 hours: Filed Vitals:   07/04/12 1752 07/04/12 2212 07/05/12 0512 07/05/12 0750  BP: 172/71 162/67 125/52 157/86  Pulse: 64 61 62 58  Temp: 98 F (36.7 C) 97.6 F (36.4 C) 97.1 F (36.2 C) 97.2 F (36.2 C)  TempSrc: Oral Oral Oral Oral  Resp: 20 20 19 19   Height:      Weight:      SpO2: 98% 92% 95% 99%   Weight change:   Intake/Output Summary (Last 24 hours) at 07/05/12 1008 Last data filed at 07/05/12 0900  Gross per 24 hour  Intake    240 ml  Output   3410 ml  Net  -3170 ml   Physical Exam:  Blood pressure 157/86, pulse 58, temperature 97.2 F (36.2 C), temperature source Oral, resp. rate 19, height 5' 6.93" (1.7 m), weight 56.1 kg (123 lb 10.9 oz), SpO2 99.00%. Weights: 07/04/12 1407 56.1 kg post HD 07/04/12 0956 58.8 kg Pre HD Pleasant elderly woman. On O2.  Bilat crackles posteriorly  Regular S1 S2 no S3 1/6 M USB  Abd + BS No focal tenderness  1+pedal edema but minimal pre tibial edema  Distal pulses intact  Labs: Basic Metabolic Panel:  Recent Labs Lab 07/04/12 0724 07/04/12 1645 07/05/12 0542  NA 134*  --  138  K 3.2*  --  3.6  CL 93*  --  98  CO2 25  --  28  GLUCOSE 112*  --  151*  BUN 37*  --  26*  CREATININE 5.39* 2.77* 3.98*  CALCIUM 8.6  --  8.0*  PHOS  --   --  2.9   Liver Function Tests:  Recent Labs Lab 07/04/12 0724 07/05/12 0542  AST 21  --   ALT 15  --   ALKPHOS 96  --   BILITOT 0.4  --   PROT 7.9  --   ALBUMIN 3.6 2.7*    Recent Labs Lab 07/04/12 1008  LIPASE 79*  AMYLASE 70   Recent Labs Lab 07/04/12 0724 07/04/12 1645 07/05/12 0542  WBC 13.9* 11.5* 10.3  NEUTROABS 10.7*  --   --   HGB 12.3 13.0 10.3*  HCT 35.6* 38.2 31.0*  MCV 93.7 94.6 95.7  PLT 284 242 262   Recent Labs Lab 07/04/12 0930 07/04/12 1645 07/04/12 2300 07/05/12 0542  TROPONINI <0.30 <0.30 <0.30 <0.30   Studies/Results: Dg Chest 2 View  07/04/2012   *RADIOLOGY REPORT*  Clinical Data:  Bilateral airspace disease versus edema check for improvement following dialysis  CHEST - 2 VIEW  Comparison: 07/04/2012 07:43 hours  Findings: Cardiac shadow is stable.  There is some left basilar infiltrate stable in appearance.  There has been some improvement in the degree of central vascular congestion when compared with the prior exam.  IMPRESSION: Improved appearance consistent with edema improvement following dialysis. There are persistent bibasilar changes worst on the left.   Original Report Authenticated By: Alcide Clever, M.D.   Dg Chest 2 View  07/04/2012   *RADIOLOGY REPORT*  Clinical Data: Shortness of breath and cough.  CHEST - 2 VIEW  Comparison: PA and lateral chest 07/05/2011.  Findings: The patient has small bilateral pleural effusions, larger on the left.  Bilateral airspace disease also appears worse on the left.  Heart size is upper normal.  No pneumothorax.  IMPRESSION: Bilateral airspace disease and small effusions, worse on the left, are likely due to asymmetric edema rather than pneumonia.  Original Report Authenticated By: Holley Dexter, M.D.   Medications:   . amLODipine  10 mg Oral Daily  . aspirin EC  81 mg Oral Daily  . atenolol  50 mg Oral Daily  . atorvastatin  40 mg Oral q1800  . ceFEPime (MAXIPIME) IV  1 g Intravenous Q8H  . [START ON 07/06/2012] ceFEPime (MAXIPIME) IV  2 g Intravenous Q T,Th,Sa-HD  . doxercalciferol  2 mcg Intravenous Q T,Th,Sa-HD  . enoxaparin (LOVENOX) injection  40 mg Subcutaneous Q24H  . famotidine  40 mg Oral Daily  . isosorbide-hydrALAZINE  1 tablet Oral BID  . multivitamin with minerals  1 tablet Oral Daily  . piperacillin-tazobactam (ZOSYN)  IV  3.375 g Intravenous Once  . potassium chloride  30 mEq Oral Once  . sodium chloride  3 mL Intravenous Q12H  . sodium chloride  3 mL Intravenous Q12H  . [START ON 07/06/2012] vancomycin  500 mg Intravenous Q T,Th,Sa-HD   I  have reviewed scheduled and prn medications.  USUAL OUTPT  HD: [facility]  Primary Nephrologist Eliott Nine  EDW 59 kg  HD Bath 2K 2.5 Ca  Dialyzer F160  Heparin 2000  Access AVF R upper  Hectorol 2 TIW  EPO 2200  Assessment/Plan:  1 Dyspnea - pulm edema +/- PNA (HCAP) Post HD CXR improved edema/still LL changes Vanco and maxipine Echo pending  2 ESRD: TTS [facility] HD 5/28 with reduction in weight to 2 kg below previous EDW and may try for a little extra volume reduction with Thurs then new EDW likely 56 kg  3 Hypertension:  Improved with volume reduction and meds  4. Anemia of ESRD: EPO has been at 2200  Hb 12.3 with drop to 10.3 so resume ESA (Aranesp 25) next HD  5. 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 No binders as phos usually in range.   6. Nausea/diarrhea - etiology not clear. LFT's normal/minimal lipase elevation/ no stool for CDiff No diarrhea now and nausea has improved   Camille Bal, MD [facility] [phone] pager 07/05/2012, 10:08 AM

## 2012-07-05 NOTE — Progress Notes (Addendum)
[facility] PROGRESS NOTE  Christine Graham:[id number] DOB: 10/21/1925 DOA: 07/04/2012 PCP: Judie Petit, MD  Assessment/Plan: 1-HCAP -Continue vancomycin and cefepime -WBC improving  -follow blood cultures -wean oxygen if able -repeat CXR--improved vascular congestion, persistent right lower lobe infiltrate  2-Fluid Overload  -Troponins negative x3 -HD for volume maintenance -check 2-D echo  -daily ASA and b-blocker 3-ESRD:  -continue HD. Renal service consulted. -appreciate Dr. Eliott Nine 4-HYPERLIPIDEMIA:  -LDL 52 - continue atorvastatin current dosing 5-GOUT: - no flares at this moment. Will use PRN pain meds if needed  6-HYPERTENSION: -Will continue home regimen, but  bidil added for better control -Await echocardiogram results  7-Leukocytosis -Improving with antibiotics 8-Nausea/vomiting/hyperlipasemia -elevated lipase due to ESRD -provide supportive care and symptomatic treatment while waiting test results.  -ate 100% breakfast today 9-weakness and general malaise: -will ask PT to see.  Family Communication:   Daughter updated at beside Disposition Plan:   SNF    Antibiotics:  Vancomycin/cefepime 07/04/2012>>>      Procedures/Studies: Dg Chest 2 View  07/04/2012   *RADIOLOGY REPORT*  Clinical Data: Bilateral airspace disease versus edema check for improvement following dialysis  CHEST - 2 VIEW  Comparison: 07/04/2012 07:43 hours  Findings: Cardiac shadow is stable.  There is some left basilar infiltrate stable in appearance.  There has been some improvement in the degree of central vascular congestion when compared with the prior exam.  IMPRESSION: Improved appearance consistent with edema improvement following dialysis. There are persistent bibasilar changes worst on the left.   Original Report Authenticated By: Alcide Clever, M.D.   Dg Chest 2 View  07/04/2012   *RADIOLOGY REPORT*  Clinical Data: Shortness of breath and cough.  CHEST - 2 VIEW   Comparison: PA and lateral chest 07/05/2011.  Findings: The patient has small bilateral pleural effusions, larger on the left.  Bilateral airspace disease also appears worse on the left.  Heart size is upper normal.  No pneumothorax.  IMPRESSION: Bilateral airspace disease and small effusions, worse on the left, are likely due to asymmetric edema rather than pneumonia.   Original Report Authenticated By: Holley Dexter, M.D.         Subjective: Patient is breathing better compared to yesterday. Denies any vomiting. Still nauseous. Denies any chest discomfort, abdominal pain, rashes, dizziness.  Objective: Filed Vitals:   07/04/12 1752 07/04/12 2212 07/05/12 0512 07/05/12 0750  BP: 172/71 162/67 125/52 157/86  Pulse: 64 61 62 58  Temp: 98 F (36.7 C) 97.6 F (36.4 C) 97.1 F (36.2 C) 97.2 F (36.2 C)  TempSrc: Oral Oral Oral Oral  Resp: 20 20 19 19   Height:      Weight:      SpO2: 98% 92% 95% 99%    Intake/Output Summary (Last 24 hours) at 07/05/12 0847 Last data filed at 07/04/12 1700  Gross per 24 hour  Intake    240 ml  Output   3410 ml  Net  -3170 ml   Weight change:  Exam:   General:  Pt is alert, follows commands appropriately, not in acute distress  HEENT: No icterus, No thrush,  [place]/AT  Cardiovascular: RRR, S1/S2, no rubs, no gallops  Respiratory: Bibasilar crackles. No wheezes or rhonchi. Good air movement.  Abdomen: Soft/+BS, non tender, non distended, no guarding  Extremities: trace  edema, No lymphangitis, No petechiae, No rashes, no synovitis  Data Reviewed: Basic Metabolic Panel:  Recent Labs Lab 07/04/12 0724 07/04/12 1645 07/05/12 0542  NA 134*  --  138  K 3.2*  --  3.6  CL 93*  --  98  CO2 25  --  28  GLUCOSE 112*  --  151*  BUN 37*  --  26*  CREATININE 5.39* 2.77* 3.98*  CALCIUM 8.6  --  8.0*  PHOS  --   --  2.9   Liver Function Tests:  Recent Labs Lab 07/04/12 0724 07/05/12 0542  AST 21  --   ALT 15  --   ALKPHOS 96  --    BILITOT 0.4  --   PROT 7.9  --   ALBUMIN 3.6 2.7*    Recent Labs Lab 07/04/12 1008  LIPASE 79*  AMYLASE 70   No results found for this basename: AMMONIA,  in the last 168 hours CBC:  Recent Labs Lab 07/04/12 0724 07/04/12 1645 07/05/12 0542  WBC 13.9* 11.5* 10.3  NEUTROABS 10.7*  --   --   HGB 12.3 13.0 10.3*  HCT 35.6* 38.2 31.0*  MCV 93.7 94.6 95.7  PLT 284 242 262   Cardiac Enzymes:  Recent Labs Lab 07/04/12 0930 07/04/12 1645 07/04/12 2300 07/05/12 0542  TROPONINI <0.30 <0.30 <0.30 <0.30   BNP: No components found with this basename: POCBNP,  CBG: No results found for this basename: GLUCAP,  in the last 168 hours  No results found for this or any previous visit (from the past 240 hour(s)).   Scheduled Meds: . amLODipine  10 mg Oral Daily  . aspirin EC  81 mg Oral Daily  . atenolol  50 mg Oral Daily  . atorvastatin  40 mg Oral q1800  . ceFEPime (MAXIPIME) IV  1 g Intravenous Q8H  . [START ON 07/06/2012] ceFEPime (MAXIPIME) IV  2 g Intravenous Q T,Th,Sa-HD  . doxercalciferol  2 mcg Intravenous Q T,Th,Sa-HD  . enoxaparin (LOVENOX) injection  40 mg Subcutaneous Q24H  . famotidine  40 mg Oral Daily  . isosorbide-hydrALAZINE  1 tablet Oral BID  . multivitamin with minerals  1 tablet Oral Daily  . piperacillin-tazobactam (ZOSYN)  IV  3.375 g Intravenous Once  . potassium chloride  30 mEq Oral Once  . sodium chloride  3 mL Intravenous Q12H  . sodium chloride  3 mL Intravenous Q12H  . [START ON 07/06/2012] vancomycin  500 mg Intravenous Q T,Th,Sa-HD   Continuous Infusions:    Mina Carlisi, DO  [facility] Pager [phone]  If 7PM-7AM, please contact night-coverage [email or web address] Password [facility] 07/05/2012, 8:47 AM   LOS: 1 day

## 2012-07-05 NOTE — Telephone Encounter (Signed)
PT son called to update Dr. Cato Mulligan on her situation. He stated she is currently inpatient and has had fluid drained off her lungs for the last few days. She is unaware of who her children are. Please assist.

## 2012-07-05 NOTE — Evaluation (Signed)
Physical Therapy Evaluation Patient Details Name: Christine Graham MRN: [id number] DOB: 1925/02/22 Today's Date: 07/05/2012 Time: 4098-1191 PT Time Calculation (min): 42 min  PT Assessment / Plan / Recommendation Clinical Impression  Christine Graham is a 79 y.o. female with pmh of ESRD, HTN, HLD, AOCD; came to ED complaining of increased SOB and non productive cough. Pt lives alone with daughter and female friend living nearby.  Pt will not have 24/7 assistance available consistently.  Today pt unable to tolerate PT evaluation secondary to c/o dizziness in sitting which became worse in standing.  Attempted to obtain pt's blood pressure in siitng but dinamap unable to capture it.  Returned pt to supine and ended session.      PT Assessment  Patient needs continued PT services    Follow Up Recommendations  SNF;Supervision/Assistance - 24 hour    Does the patient have the potential to tolerate intense rehabilitation      Barriers to Discharge Decreased caregiver support Pt lives alone and will not have consistent caregiver.  Pt's "boyfriend" presents as a frail elederly gentleman who would not be able to provide more than supervision to min assist.      Equipment Recommendations  None recommended by PT    Recommendations for Other Services     Frequency Min 3X/week    Precautions / Restrictions Precautions Precautions: Fall Restrictions Weight Bearing Restrictions: No   Pertinent Vitals/Pain No c/o pain.        Mobility  Bed Mobility Bed Mobility: Rolling Right;Right Sidelying to Sit;Rolling Left;Sit to Sidelying Right Rolling Right: 4: Min assist;With rail Rolling Left: 4: Min assist;With rail Right Sidelying to Sit: 3: Mod assist;HOB flat Sit to Sidelying Right: 3: Mod assist;HOB flat Details for Bed Mobility Assistance: VCs and min assist to roll bilaterally.  Manual facilitation to raise shoulders from bed and Verbal/tactile cues for technique.   Transfers Transfers: Sit to  Stand;Stand to Sit Sit to Stand: 3: Mod assist;From bed Stand to Sit: 3: Mod assist;To bed;With upper extremity assist Details for Transfer Assistance: Assist to extend hips and knees secondary to LE weakness.  Pt only able to stand for a few seconds <5 before abruptly sitting back on the bed secondary to worsening dizziness.   Ambulation/Gait Ambulation/Gait Assistance: Not tested (comment) Wheelchair Mobility Wheelchair Mobility: No    Exercises     PT Diagnosis: Generalized weakness  PT Problem List: Decreased strength;Decreased activity tolerance;Decreased mobility;Decreased knowledge of use of DME PT Treatment Interventions: DME instruction;Gait training;Functional mobility training;Stair training;Therapeutic activities;Balance training;Neuromuscular re-education;Patient/family education   PT Goals Acute Rehab PT Goals PT Goal Formulation: With patient Time For Goal Achievement: 07/19/12 Potential to Achieve Goals: Good Pt will go Supine/Side to Sit: with modified independence PT Goal: Supine/Side to Sit - Progress: Goal set today Pt will go Sit to Supine/Side: with modified independence PT Goal: Sit to Supine/Side - Progress: Goal set today Pt will Transfer Bed to Chair/Chair to Bed: with modified independence PT Transfer Goal: Bed to Chair/Chair to Bed - Progress: Goal set today Pt will Stand: with modified independence PT Goal: Stand - Progress: Goal set today Pt will Ambulate: 51 - 150 feet;with modified independence;with least restrictive assistive device PT Goal: Ambulate - Progress: Goal set today Pt will Go Up / Down Stairs: 1-2 stairs;with supervision;with least restrictive assistive device PT Goal: Up/Down Stairs - Progress: Goal set today  Visit Information  Last PT Received On: 07/05/12 Assistance Needed: +1    Subjective Data  Subjective: Agree  to PT EVal   Prior Functioning  Home Living Lives With: Alone Available Help at Discharge: Family;Available  PRN/intermittently Type of Home: House Home Access: Stairs to enter [employer] of Steps: 2 Entrance Stairs-Rails: None Home Layout: One level Bathroom Shower/Tub: Tub/shower unit;Curtain Home Adaptive Equipment: Bedside commode/3-in-1;Walker - rolling;Shower chair with back Prior Function Level of Independence: Independent Able to Take Stairs?: Yes Driving: Yes Vocation: Retired Musician: Surveyor, mining Arousal/Alertness: Awake/alert Behavior During Therapy: WFL for tasks assessed/performed Overall Cognitive Status: Within Functional Limits for tasks assessed    Extremity/Trunk Assessment Right Lower Extremity Assessment RLE ROM/Strength/Tone: Deficits RLE ROM/Strength/Tone Deficits: gross LE weakness 4-/5 Left Lower Extremity Assessment LLE ROM/Strength/Tone: Deficits LLE ROM/Strength/Tone Deficits: gross LE weakness 4-/5   Balance Balance Balance Assessed: Yes Static Sitting Balance Static Sitting - Balance Support: Feet supported;Bilateral upper extremity supported Static Sitting - Level of Assistance: 4: Min assist Static Sitting - Comment/# of Minutes: pt sat on EOB approximately 10 minutes with c/o mild dizziness.  Pt unsteady throughout presenting with mild to moderate posterior lean.  Pt able to maintain sitting balance without tactile cueing for less than 1 minute on several trials.   Static Standing Balance Static Standing - Balance Support: Bilateral upper extremity supported Static Standing - Level of Assistance: 2: Max assist Static Standing - Comment/# of Minutes: Pt unable to stand for longer than 3-5 seconds secondary to dizziness.   End of Session PT - End of Session Equipment Utilized During Treatment: Gait belt Activity Tolerance: Treatment limited secondary to medical complications (Comment) Patient left: in bed;with call bell/phone within reach;with family/visitor present Nurse Communication: Mobility  status;Other (comment) (c/o dizziness in sit/stand)  GP     Izetta Sakamoto 07/05/2012, 6:39 PM Tyra Gural L. Chilton Si, DPT   Pager [phone]     Cell [phone]

## 2012-07-05 NOTE — Progress Notes (Addendum)
I was called to see the patient because of declining mental status this afternoon around 1600. Vital signs were stable with temperature 98.2, heart rate 60, respirations 17, blood pressure 167/49. The patient is awake but confused. Denies any shortness of breath or pain at this time. Denies any chest pain or abdominal pain. CV-RRR Lung-bibasilar crackles, good air movement Abd-soft, no  fluid wave, positive bowel sounds, no tenderness when distracted  Acute encephalopathy  -Suspect to due to her 2 doses of Pepcid 40 mg each--discontinued -CT brain without contrast  -Check serum B12, RBC folate - echocardiogram shows EF 55-60%, grade 2 diastolic dysfunction, moderate MR, small pericardial effusion -Son and daughter updated at the bedside  DTat

## 2012-07-06 ENCOUNTER — Inpatient Hospital Stay (HOSPITAL_COMMUNITY): Payer: Medicare Other

## 2012-07-06 DIAGNOSIS — I509 Heart failure, unspecified: Secondary | ICD-10-CM

## 2012-07-06 DIAGNOSIS — J9600 Acute respiratory failure, unspecified whether with hypoxia or hypercapnia: Secondary | ICD-10-CM

## 2012-07-06 DIAGNOSIS — G40401 Other generalized epilepsy and epileptic syndromes, not intractable, with status epilepticus: Secondary | ICD-10-CM

## 2012-07-06 DIAGNOSIS — I129 Hypertensive chronic kidney disease with stage 1 through stage 4 chronic kidney disease, or unspecified chronic kidney disease: Secondary | ICD-10-CM

## 2012-07-06 DIAGNOSIS — G9340 Encephalopathy, unspecified: Secondary | ICD-10-CM | POA: Diagnosis present

## 2012-07-06 DIAGNOSIS — I5031 Acute diastolic (congestive) heart failure: Secondary | ICD-10-CM | POA: Diagnosis present

## 2012-07-06 LAB — BLOOD GAS, ARTERIAL
Acid-Base Excess: 5 mmol/L — ABNORMAL HIGH (ref 0.0–2.0)
Bicarbonate: 28 mEq/L — ABNORMAL HIGH (ref 20.0–24.0)
Patient temperature: 98.6
TCO2: 29.1 mmol/L (ref 0–100)
pCO2 arterial: 34.4 mmHg — ABNORMAL LOW (ref 35.0–45.0)

## 2012-07-06 LAB — MRSA PCR SCREENING: MRSA by PCR: NEGATIVE

## 2012-07-06 LAB — BASIC METABOLIC PANEL
BUN: 36 mg/dL — ABNORMAL HIGH (ref 6–23)
Chloride: 95 mEq/L — ABNORMAL LOW (ref 96–112)
GFR calc Af Amer: 8 mL/min — ABNORMAL LOW (ref >90)
Potassium: 3.6 mEq/L (ref 3.5–5.1)

## 2012-07-06 LAB — CBC
HCT: 33.5 % — ABNORMAL LOW (ref 36.0–46.0)
Hemoglobin: 11.1 g/dL — ABNORMAL LOW (ref 12.0–15.0)
RBC: 3.54 MIL/uL — ABNORMAL LOW (ref 3.87–5.11)
RDW: 15.3 % (ref 11.5–15.5)
WBC: 13.3 10*3/uL — ABNORMAL HIGH (ref 4.0–10.5)

## 2012-07-06 LAB — CSF CELL COUNT WITH DIFFERENTIAL
Lymphs, CSF: 22 % — ABNORMAL LOW (ref 40–80)
Segmented Neutrophils-CSF: 72 % — ABNORMAL HIGH (ref 0–6)
WBC, CSF: 14 /mm3 (ref 0–5)

## 2012-07-06 LAB — LEGIONELLA ANTIGEN, URINE

## 2012-07-06 LAB — SALICYLATE LEVEL: Salicylate Lvl: <2 mg/dL — ABNORMAL LOW (ref 2.8–20.0)

## 2012-07-06 LAB — CORTISOL: Cortisol, Plasma: 15.9 ug/dL

## 2012-07-06 LAB — AMMONIA: Ammonia: 17 umol/L (ref 11–60)

## 2012-07-06 MED ORDER — CHLORHEXIDINE GLUCONATE 0.12 % MT SOLN
15.0000 mL | Freq: Two times a day (BID) | OROMUCOSAL | Status: DC
  Administered 2012-07-06 – 2012-07-23 (×34): 15 mL via OROMUCOSAL
  Filled 2012-07-06 (×36): qty 15

## 2012-07-06 MED ORDER — PROPOFOL 10 MG/ML IV EMUL
INTRAVENOUS | Status: AC
  Filled 2012-07-06: qty 100

## 2012-07-06 MED ORDER — DARBEPOETIN ALFA-POLYSORBATE 25 MCG/0.42ML IJ SOLN
INTRAMUSCULAR | Status: AC
  Administered 2012-07-06: 25 ug via INTRAVENOUS
  Filled 2012-07-06: qty 0.42

## 2012-07-06 MED ORDER — RENA-VITE PO TABS
1.0000 | ORAL_TABLET | Freq: Every day | ORAL | Status: DC
  Administered 2012-07-06 – 2012-07-10 (×5): 1 via ORAL
  Filled 2012-07-06 (×6): qty 1

## 2012-07-06 MED ORDER — LORAZEPAM 2 MG/ML IJ SOLN: 2.0000 mg | Freq: Once | INTRAMUSCULAR | Status: DC

## 2012-07-06 MED ORDER — LORAZEPAM 2 MG/ML IJ SOLN
INTRAMUSCULAR | Status: AC
  Administered 2012-07-06: 1 mg via INTRAVENOUS
  Filled 2012-07-06: qty 1

## 2012-07-06 MED ORDER — DEXTROSE 5 % IV SOLN
2.0000 ug/min | INTRAVENOUS | Status: DC
  Administered 2012-07-06: 2 ug/min via INTRAVENOUS
  Administered 2012-07-07: 6.5 ug/min via INTRAVENOUS
  Administered 2012-07-07: 6 ug/min via INTRAVENOUS
  Filled 2012-07-06 (×3): qty 4

## 2012-07-06 MED ORDER — METOPROLOL TARTRATE 1 MG/ML IV SOLN
5.0000 mg | Freq: Four times a day (QID) | INTRAVENOUS | Status: DC
  Administered 2012-07-09 – 2012-07-10 (×3): 5 mg via INTRAVENOUS
  Filled 2012-07-06 (×16): qty 5

## 2012-07-06 MED ORDER — HYDRALAZINE HCL 20 MG/ML IJ SOLN: 5.0000 mg | Freq: Two times a day (BID) | INTRAMUSCULAR | Status: DC

## 2012-07-06 MED ORDER — FENTANYL CITRATE 0.05 MG/ML IJ SOLN: 25.0000 ug | INTRAMUSCULAR | Status: DC | PRN

## 2012-07-06 MED ORDER — SODIUM CHLORIDE 0.9 % IV BOLUS (SEPSIS)
1000.0000 mL | Freq: Once | INTRAVENOUS | Status: AC
  Administered 2012-07-06: 1000 mL via INTRAVENOUS

## 2012-07-06 MED ORDER — SODIUM CHLORIDE 0.9 % IV SOLN
1000.0000 mg | Freq: Two times a day (BID) | INTRAVENOUS | Status: DC
  Administered 2012-07-06 – 2012-07-08 (×5): 1000 mg via INTRAVENOUS
  Filled 2012-07-06 (×6): qty 10

## 2012-07-06 MED ORDER — SODIUM CHLORIDE 0.9 % IV SOLN
20.0000 mg/kg | Freq: Once | INTRAVENOUS | Status: AC
  Administered 2012-07-06: 1176 mg via INTRAVENOUS
  Filled 2012-07-06: qty 23.52

## 2012-07-06 MED ORDER — ASPIRIN 300 MG RE SUPP
300.0000 mg | Freq: Every day | RECTAL | Status: DC
  Administered 2012-07-07 – 2012-07-11 (×5): 300 mg via RECTAL
  Filled 2012-07-06 (×8): qty 1

## 2012-07-06 MED ORDER — DEXTROSE 5 % IV SOLN
300.0000 mg | Freq: Three times a day (TID) | INTRAVENOUS | Status: DC
  Administered 2012-07-06 – 2012-07-07 (×3): 300 mg via INTRAVENOUS
  Filled 2012-07-06 (×5): qty 6

## 2012-07-06 MED ORDER — SODIUM CHLORIDE 0.9 % IV BOLUS (SEPSIS)
500.0000 mL | INTRAVENOUS | Status: AC
  Administered 2012-07-06: 500 mL via INTRAVENOUS

## 2012-07-06 MED ORDER — LORAZEPAM 2 MG/ML IJ SOLN: 1.0000 mg | Freq: Once | INTRAMUSCULAR | Status: AC

## 2012-07-06 MED ORDER — PANTOPRAZOLE SODIUM 40 MG IV SOLR
40.0000 mg | INTRAVENOUS | Status: DC
  Administered 2012-07-06 – 2012-07-09 (×4): 40 mg via INTRAVENOUS
  Filled 2012-07-06 (×5): qty 40

## 2012-07-06 MED ORDER — HYDRALAZINE HCL 20 MG/ML IJ SOLN
5.0000 mg | Freq: Three times a day (TID) | INTRAMUSCULAR | Status: DC
  Administered 2012-07-08 – 2012-07-10 (×4): 5 mg via INTRAVENOUS
  Filled 2012-07-06 (×14): qty 0.25

## 2012-07-06 MED ORDER — ETOMIDATE 2 MG/ML IV SOLN
20.0000 mg | Freq: Once | INTRAVENOUS | Status: AC
  Administered 2012-07-06: 20 mg via INTRAVENOUS

## 2012-07-06 MED ORDER — FENTANYL CITRATE 0.05 MG/ML IJ SOLN
INTRAMUSCULAR | Status: AC
  Administered 2012-07-06: 100 ug
  Filled 2012-07-06: qty 2

## 2012-07-06 MED ORDER — MIDAZOLAM HCL 2 MG/2ML IJ SOLN: 1.0000 mg | INTRAMUSCULAR | Status: DC | PRN

## 2012-07-06 MED ORDER — LORAZEPAM 2 MG/ML IJ SOLN
1.0000 mg | Freq: Once | INTRAMUSCULAR | Status: AC
  Administered 2012-07-06: 1 mg via INTRAVENOUS

## 2012-07-06 MED ORDER — BIOTENE DRY MOUTH MT LIQD
15.0000 mL | Freq: Four times a day (QID) | OROMUCOSAL | Status: DC
  Administered 2012-07-06 – 2012-07-23 (×63): 15 mL via OROMUCOSAL

## 2012-07-06 MED ORDER — DOXERCALCIFEROL 4 MCG/2ML IV SOLN
INTRAVENOUS | Status: AC
  Administered 2012-07-06: 2 ug via INTRAVENOUS
  Filled 2012-07-06: qty 2

## 2012-07-06 MED ORDER — CHLORHEXIDINE GLUCONATE 0.12 % MT SOLN
OROMUCOSAL | Status: AC
  Administered 2012-07-06: 12:00:00
  Filled 2012-07-06: qty 15

## 2012-07-06 MED ORDER — PROPOFOL 10 MG/ML IV EMUL
50.0000 ug/kg/min | INTRAVENOUS | Status: DC
  Administered 2012-07-06 – 2012-07-07 (×5): 75 ug/kg/min via INTRAVENOUS
  Filled 2012-07-06 (×4): qty 100

## 2012-07-06 MED ORDER — MIDAZOLAM HCL 2 MG/2ML IJ SOLN
INTRAMUSCULAR | Status: AC
  Administered 2012-07-06: 4 mg
  Filled 2012-07-06: qty 4

## 2012-07-06 NOTE — Progress Notes (Signed)
ANTIBIOTIC CONSULT NOTE - FOLLOW UP  Pharmacy Consult for Vancomycin, Cefepime and Acylcovir Indication: pneumonia and viral encephalitis coverage  Allergies  Allergen Reactions  . Codeine Nausea Only  . Oxycodone Nausea Only    Patient Measurements: Height: 5' 6.93" (170 cm) Weight: 129 lb 10.1 oz (58.8 kg) IBW/kg (Calculated) : 61.44  Vital Signs: Temp: 98.5 F (36.9 C) (05/29 0934) Temp src: Axillary (05/29 0934) BP: 118/95 mmHg (05/29 1500) Pulse Rate: 52 (05/29 1500) Intake/Output from previous day: 05/28 0701 - 05/29 0700 In: 360 [P.O.:360] Out: -  Intake/Output from this shift: Total I/O In: -  Out: 1360 [Other:1360]  Labs:  Recent Labs  07/05/12 0542 07/05/12 2208 07/06/12 0500  WBC 10.3 16.7* 13.3*  HGB 10.3* 12.0 11.1*  PLT 262 264 239  CREATININE 3.98* 4.74* 5.23*   Estimated Creatinine Clearance: 7.3 ml/min (by C-G formula based on Cr of 5.23).  Microbiology: Recent Results (from the past 720 hour(s))  CULTURE, BLOOD (ROUTINE X 2)     Status: None   Collection Time    07/04/12  9:30 AM      Result Value Range Status   Specimen Description BLOOD LEFT HAND   Final   Special Requests BOTTLES DRAWN AEROBIC ONLY 9CC   Final   Culture  Setup Time 07/04/2012 13:48   Final   Culture     Final   Value:        BLOOD CULTURE RECEIVED NO GROWTH TO DATE CULTURE WILL BE HELD FOR 5 DAYS BEFORE ISSUING A FINAL NEGATIVE REPORT   Report Status PENDING   Incomplete  CULTURE, BLOOD (ROUTINE X 2)     Status: None   Collection Time    07/04/12  9:42 AM      Result Value Range Status   Specimen Description BLOOD LEFT HAND   Final   Special Requests BOTTLES DRAWN AEROBIC ONLY 1CC   Final   Culture  Setup Time 07/04/2012 13:48   Final   Culture     Final   Value:        BLOOD CULTURE RECEIVED NO GROWTH TO DATE CULTURE WILL BE HELD FOR 5 DAYS BEFORE ISSUING A FINAL NEGATIVE REPORT   Report Status PENDING   Incomplete  MRSA PCR SCREENING     Status: None   Collection Time    07/06/12 12:07 PM      Result Value Range Status   MRSA by PCR NEGATIVE  NEGATIVE Final   Comment:            The GeneXpert MRSA Assay (FDA     approved for NASAL specimens     only), is one component of a     comprehensive MRSA colonization     surveillance program. It is not     intended to diagnose MRSA     infection nor to guide or     monitor treatment for     MRSA infections.    Anti-infectives   Start     Dose/Rate Route Frequency Ordered Stop   07/06/12 1600  acyclovir (ZOVIRAX) 300 mg in dextrose 5 % 100 mL IVPB     300 mg 106 mL/hr over 60 Minutes Intravenous 3 times per day 07/06/12 1443     07/06/12 1200  ceFEPIme (MAXIPIME) 2 g in dextrose 5 % 50 mL IVPB     2 g 100 mL/hr over 30 Minutes Intravenous Every T-Th-Sa (Hemodialysis) 07/04/12 1747     07/06/12 1200  vancomycin (  VANCOCIN) 500 mg in sodium chloride 0.9 % 100 mL IVPB     500 mg 100 mL/hr over 60 Minutes Intravenous Every T-Th-Sa (Hemodialysis) 07/04/12 1750     07/04/12 2000  ceFEPIme (MAXIPIME) 1 g in dextrose 5 % 50 mL IVPB     1 g 100 mL/hr over 30 Minutes Intravenous  Once 07/04/12 1747 07/04/12 2046   07/04/12 1800  ceFEPIme (MAXIPIME) 1 g in dextrose 5 % 50 mL IVPB  Status:  Discontinued     1 g 100 mL/hr over 30 Minutes Intravenous Every 8 hours 07/04/12 1603 07/05/12 1304   07/04/12 0915  vancomycin (VANCOCIN) IVPB 1000 mg/200 mL premix     1,000 mg 200 mL/hr over 60 Minutes Intravenous  Once 07/04/12 0902 07/04/12 1404   07/04/12 0915  piperacillin-tazobactam (ZOSYN) IVPB 3.375 g  Status:  Discontinued     3.375 g 12.5 mL/hr over 240 Minutes Intravenous  Once 07/04/12 0902 07/06/12 1323      Assessment:   Day # 3 Vancomycin and Cefepime for HCAP coverage. 8 days planned. (Zosyn x 1 ordered in ED 5/27 but not charted as given.)   Vancomycin 500 mg IV given in HD today.   Cefepime 2 grams IV given 5/27 pm (2 x 1 gram doses), and currently scheduled for 2 grams IV after each HD  session.  Unfortunately, initial Cefepime 1 gram IV q8h order was still active, and 2 doses were given on 5/28. Total of 4 grams over ~18 hours.  Altered mental status last night, possible seizure activity, worsened overnight. Hemodialysis stopped early (by ~1 hr 41 min) to facilitate EEG.  Cefepime held today, as it can lower seizure threshold, and may need to be changed. Discussed briefly with Dr. Delton Coombes earlier today.  Expect there is still 1-2 grams Cefepime on board, as ~ 2 hrs of HD may have cleared ~40-50% of total previously given.  LP pending, but to begin Acyclovir for empiric encephalitis/meningitis coverage.  Goal of Therapy:  pre-dialysis Vancomycin levels 15-25 mcg/ml Appropriate Cefepime & Acyclovir doses for renal function and infection  Plan:   Continue Vancomycin 500 mg IV with each HD session.  Hold today's Cefepime 2 gram IV dose; expect still ~1-2 grams on board. Will follow up for any change in antibiotic therapy. Next dose currently scheduled for 5/31 after dialysis.  Begin Acyclovir 300 mg (~ 5 mg/kg) IV q24hrs.  To be given after HD on HD days.  Will follow up LP studies, culture data, and clinical status/any further seizure activity.  Dennie Fetters, [place] Pager: [phone] 07/06/2012,3:27 PM

## 2012-07-06 NOTE — Progress Notes (Signed)
PT Cancellation Note  Patient Details Name: TALESHIA LUFF MRN: [id number] DOB: 1925-08-27   Cancelled Treatment:    Reason Eval/Treat Not Completed: Medical issues which prohibited therapy (decline in medical status.  )   Gatsby Chismar 07/06/2012, 2:06 PM Sarh Kirschenbaum L. Chilton Si, DPT   Pager [phone]     Cell [phone]

## 2012-07-06 NOTE — Progress Notes (Signed)
Subjective: Continues to be obtunded  Exam: Filed Vitals:   07/06/12 0937  BP: 173/50  Pulse: 65  Temp:   Resp: 19   Gen: In bed, NAD MS: Eyes open spontaneously, but patient does not fixate, track. She does not follow commands.  XB:JYNWG, eyes midline, but doe not look right or left even when head turns. Dolls eye reflex is present, She appears to blink to threat, though this is unclear as may have stimulated corneal.  Motor: withdraws to nox stim x 4. Very frequent Multifocal myoclonus that is worsened with stimulation.  Sensory:as above.  DTR:2+ and symmetric.   ABG alkalosis  Blood cultures pending CBC - elevated WBC BMP - no clear cause of MS changes  Impression: 79 yo F with progressive altered mental status, multifocal myoclonus in the setting of elevated WBC. Delirium from infection/sepsis is a possibility but this degree of encephalopathy would be unusual.   Recommendations: 1) EEG 2) Ammonia, cortisol, magnesium 3) If other workup negative, will consider LP   Ritta Slot, MD [facility] [phone]  If 7pm- 7am, please page neurology on call at [phone].

## 2012-07-06 NOTE — Progress Notes (Addendum)
Discussed case with Drs. Eliott Nine and [place].  Concerned about seizure and status epilepticus.  Pt given Ativan IV without much effect and pt continues to be obtunded.  Dr. Amada Jupiter concerned about status epilepiticus and possible need for intubation.  Due to current condition, CCM, Dr. Delton Coombes was called for transfer patient to ICU for close monitoring and possible intubation.  Dr. Delton Coombes agreed to see patient.    I have called patient's son and daughter and updated them on pt's clinical situation.  DTat [phone]

## 2012-07-06 NOTE — Procedures (Signed)
Intubation Procedure Note Christine Graham [id number] October 12, 1925  Procedure: Intubation Indications: Airway protection and maintenance  Procedure Details Consent: Risks of procedure as well as the alternatives and risks of each were explained to the (patient/caregiver).  Consent for procedure obtained. Time Out: Verified patient identification, verified procedure, site/side was marked, verified correct patient position, special equipment/implants available, medications/allergies/relevent history reviewed, required imaging and test results available.  Performed  Meds: fentanyl 100, versed 4, etomidate 20  Maximum sterile technique was used including gloves.  MAC and 3    Evaluation Hemodynamic Status: Transient hypertension requiring treatment; O2 sats: stable throughout Patient's Current Condition: stable Complications: No apparent complications Patient did tolerate procedure well. Chest X-ray ordered to verify placement.  CXR: pending.   Barbarita Hutmacher S. 07/06/2012

## 2012-07-06 NOTE — Progress Notes (Signed)
EEG started, set up for LTVM per Dr Amada Jupiter.

## 2012-07-06 NOTE — Progress Notes (Addendum)
Subjective:  Notes reviewed Pt with progressive decline in mental status reportedly starting late yesterday afternoon, and has progressed to a state of unresponsiveness this AM - seen in dialysis where she is unresponsive to sternal rub, having myoclonic jerks of all exts but seemingly more so on the left side  Objective Vital signs in last 24 hours: Filed Vitals:   07/06/12 0705 07/06/12 0730 07/06/12 0800 07/06/12 0830  BP: 181/44 176/57 154/87 110/77  Pulse: 63 66 59 63  Temp:      TempSrc:      Resp: 23 21 19 21   Height:      Weight:      SpO2: 100% 100%  100%   Weight change: -0.422 kg (-14.9 oz)  Intake/Output Summary (Last 24 hours) at 07/06/12 0903 Last data filed at 07/05/12 1700  Gross per 24 hour  Intake    360 ml  Output      0 ml  Net    360 ml   Physical Exam:  Blood pressure 157/86, pulse 58, temperature 97.2 F (36.2 C), temperature source Oral, resp. rate 19, height 5' 6.93" (1.7 m), weight 56.1 kg (123 lb 10.9 oz), SpO2 99.00%. Weights: 07/06/12    58.8 kg pre HD 07/04/12  56.1 kg post HD 07/04/12  58.8 kg Pre HD Obtunded, moves exts spontaneously with some myoclonus Not responsive to voice Myoclonus seems more prominent on the left side Resp effort not great Lungs ant fairly clear Regular S1 S2 no S3 1/6 M USB  Abd + BS No focal tenderness   minimal pre tibial edema  Distal pulses intact  Labs: Basic Metabolic Panel:  Recent Labs Lab 07/04/12 0724 07/04/12 1645 07/05/12 0542 07/05/12 2208 07/06/12 0500  NA 134*  --  138 135 134*  K 3.2*  --  3.6 4.0 3.6  CL 93*  --  98 95* 95*  CO2 25  --  28 26 22   GLUCOSE 112*  --  151* 122* 119*  BUN 37*  --  26* 33* 36*  CREATININE 5.39* 2.77* 3.98* 4.74* 5.23*  CALCIUM 8.6  --  8.0* 8.8 8.4  PHOS  --   --  2.9  --   --    Liver Function Tests:  Recent Labs Lab 07/04/12 0724 07/05/12 0542 07/05/12 2208  AST 21  --  19  ALT 15  --  13  ALKPHOS 96  --  85  BILITOT 0.4  --  0.4  PROT 7.9  --   7.8  ALBUMIN 3.6 2.7* 3.4*    Recent Labs Lab 07/04/12 1008  LIPASE 79*  AMYLASE 70    Recent Labs Lab 07/04/12 0724 07/04/12 1645 07/05/12 0542 07/05/12 2208 07/06/12 0500  WBC 13.9* 11.5* 10.3 16.7* 13.3*  NEUTROABS 10.7*  --   --  12.9*  --   HGB 12.3 13.0 10.3* 12.0 11.1*  HCT 35.6* 38.2 31.0* 35.0* 33.5*  MCV 93.7 94.6 95.7 94.9 94.6  PLT 284 242 262 264 239    Recent Labs Lab 07/04/12 0930 07/04/12 1645 07/04/12 2300 07/05/12 0542  TROPONINI <0.30 <0.30 <0.30 <0.30   Studies/Results: Dg Chest 2 View  07/05/2012   *RADIOLOGY REPORT*  Clinical Data: Follow up shortness of breath  CHEST - 2 VIEW  Comparison: 07/04/2012  Findings: Normal heart size. There are low lung volumes.  Bilateral pleural effusions are identified and appear similar to previous exam.  There is moderate interstitial edema.  Review of the visualized osseous structures  is significant for thoracic spondylosis.  IMPRESSION:  1.  Moderate CHF.  This is not significantly improved from previous exam.   Original Report Authenticated By: Signa Kell, M.D.   Dg Chest 2 View  07/04/2012   *RADIOLOGY REPORT*  Clinical Data: Bilateral airspace disease versus edema check for improvement following dialysis  CHEST - 2 VIEW  Comparison: 07/04/2012 07:43 hours  Findings: Cardiac shadow is stable.  There is some left basilar infiltrate stable in appearance.  There has been some improvement in the degree of central vascular congestion when compared with the prior exam.  IMPRESSION: Improved appearance consistent with edema improvement following dialysis. There are persistent bibasilar changes worst on the left.   Original Report Authenticated By: Alcide Clever, M.D.   Ct Head Wo Contrast  07/05/2012   *RADIOLOGY REPORT*  Clinical Data: Altered mental status  CT HEAD WITHOUT CONTRAST  Technique:  Contiguous axial images were obtained from the base of the skull through the vertex without contrast.  Comparison: Head CT  05/14/2007  Findings: The patient is moving within the scanner.  Repeat exams were performed with no significant improvement. The exam is suboptimal.  There is a hypodense lesion within the right cerebellum.  No intracranial hemorrhage.   There is a rounded lesion within the CSF space adjacent to the right aspect of the pons which is not  seen on prior.  There is mild ventricular dilatation and periventricular white matter hypodensities  Paranasal sinuses and mastoid air cells are clear.  Orbits are normal.  IMPRESSION:  1.  Age indeterminate right cerebellar infarction.  This could represent an acute infarction. 2.  New high density rounded lesion right adjacent to the pons. This could represent the dolichoectasia or aneurysm of the vertebral artery. A meningioma is a secondary consideration.   Original Report Authenticated By: Genevive Bi, M.D.   Mr Brain Wo Contrast  07/06/2012   *RADIOLOGY REPORT*  Clinical Data: Progressive mental status changes.  Abnormal CT scan.  MRI HEAD WITHOUT CONTRAST  Technique:  Multiplanar, multiecho pulse sequences of the brain and surrounding structures were obtained according to standard protocol without intravenous contrast.  Comparison: CT head without contrast 07/05/2012  Findings: The diffusion weighted images demonstrate no evidence for acute or subacute infarction.  The right inferior cerebellar infarcts are remote.  Moderate atrophy and bilateral periventricular white matter disease is evident bilaterally. Remote lacunar infarcts are present in the basal ganglia bilaterally. A remote lacunar infarct is evident in the left cerebellum.  No acute infarct, hemorrhage, or mass lesion is present.  The ventricles are proportionate to the degree of atrophy.  The globes and orbits are intact. The paranasal sinuses and mastoid air cells are clear.  IMPRESSION:  1.  No acute intracranial abnormality. 2.  The right inferior cerebellar infarct is remote. 3.  Advanced atrophy and  white matter disease likely reflects the sequelae of chronic microvascular ischemia in this patient with hypertension and end-stage renal disease. 4.  Remote lacunar infarcts of the basal ganglia and left cerebellum.   Original Report Authenticated By: Marin Roberts, M.D.   Medications:   . aspirin  300 mg Rectal Daily  . atorvastatin  40 mg Oral q1800  . ceFEPime (MAXIPIME) IV  2 g Intravenous Q T,Th,Sa-HD  . darbepoetin      . darbepoetin (ARANESP) injection - DIALYSIS  25 mcg Intravenous Q Thu-HD  . doxercalciferol      . doxercalciferol  2 mcg Intravenous Q T,Th,Sa-HD  . hydrALAZINE  5 mg Intravenous Q8H  . metoprolol  5 mg Intravenous Q6H  . multivitamin with minerals  1 tablet Oral Daily  . piperacillin-tazobactam (ZOSYN)  IV  3.375 g Intravenous Once  . potassium chloride  30 mEq Oral Once  . sodium chloride  3 mL Intravenous Q12H  . sodium chloride  3 mL Intravenous Q12H  . vancomycin  500 mg Intravenous Q T,Th,Sa-HD   I  have reviewed scheduled and prn medications.  USUAL OUTPT HD: [facility] at [facility]  Primary Nephrologist Eliott Nine  EDW 59 kg  HD Bath 2K 2.5 Ca  Dialyzer F160  Heparin 2000  Access AVF R upper  Hectorol 2 TIW  EPO 2200  Assessment/Plan:  Pulm edema Some better with fluid removal at HD.  Will have lower EDW.probably around 56 kg Echo nl EF, diastolic dysfunction, mod MR  HCAP on antibiotics (Vanco/maxipime)  Acute altered mental status   has progressed to obtundation Etiology not certain Worsening overnight ? seizures CT/MR changes old For EEG ASAP Neuro is following Should prob have LP   ESRD: TTS [facility] Stopping HD early today to facilitate getting EEG done ASAP (K OK)  Hypertension:  Improved with volume reduction and meds  Anemia of ESRD: EPO has been at 2200  Aranesp in hosp   Metabolic Bone Disease:  On hectorol. No binders as phos usually in range.   Nausea/diarrhea - etiology not clear. LFT's normal/minimal lipase  elevation/ no stool for CDiff No diarrhea now    Camille Bal, MD [facility] [phone] pager 07/06/2012, 9:03 AM

## 2012-07-06 NOTE — Progress Notes (Signed)
[facility] PROGRESS NOTE  Christine Graham AOZ:[id number] DOB: Dec 01, 1925 DOA: 07/04/2012 PCP: Judie Petit, MD  Assessment/Plan: Pulmonary Infiltrates -serial CXRs and clinical progress suggest more pulm edema -Continue empiric vancomycin and cefepime  -follow blood cultures--5/27 blood cultures neg -wean oxygen if able--100% SaO2 on 2 liters -repeat CXR 5/28--vascular congestion Acute encephalopathy -Multifactorial including medications (Pepcid), CHF, possible seizure, infectious process, and hypertensive encephalopathy -Troponins negative x3  -07/04/2012 urinalysis without any pyuria, 07/04/2012 blood cultures negative -WBC slight increase today--blood cultures reordered -Myoclonus noted on examination--? Focal seizure--> EEG has been ordered -Appreciate neurology input -CT brain with age indeterminate right cerebellar infarct--would not explain current mental status change -MRI brain results pending -TSH 4.339 -Check serum B12, ABG, RBC folate, EKG -Pepcid was discontinued, but the patient received dose yesterday--if this was etiology, I expected her mentation to worsen before it gets better which was explained to the patient's family yesterday -Will need to change antihypertensive medications to IV form if unable to take her medications by mouth--lopressor IV and hydralazine IV Acute diastolic CHF -EF 96-29%, grade 2 diastolic dysfunction, moderate MR ESRD:  -continue HD for fluid management -appreciate Dr. Eliott Nine  HYPERTENSION:  -Will continue home regimen, but bidil added for better control  -Await echocardiogram results  -Expect blood pressure to improve with continued HD HYPERLIPIDEMIA:  -LDL 52  - continue atorvastatin current dosing  GOUT:  - no flares at this moment. Will use PRN pain meds if needed  Nausea/vomiting/hyperlipasemia  -elevated lipase due to ESRD  -provide supportive care and symptomatic treatment while waiting test results.  -ate 100%  breakfast on 07/05/12  weakness and general malaise:  -will ask PT to see.  Family Communication: seen in HD today Disposition Plan: SNF  Antibiotics: Vancomycin/cefepime 07/04/2012>>>         Procedures/Studies: Dg Chest 2 View  07/05/2012   *RADIOLOGY REPORT*  Clinical Data: Follow up shortness of breath  CHEST - 2 VIEW  Comparison: 07/04/2012  Findings: Normal heart size. There are low lung volumes.  Bilateral pleural effusions are identified and appear similar to previous exam.  There is moderate interstitial edema.  Review of the visualized osseous structures is significant for thoracic spondylosis.  IMPRESSION:  1.  Moderate CHF.  This is not significantly improved from previous exam.   Original Report Authenticated By: Signa Kell, M.D.   Dg Chest 2 View  07/04/2012   *RADIOLOGY REPORT*  Clinical Data: Bilateral airspace disease versus edema check for improvement following dialysis  CHEST - 2 VIEW  Comparison: 07/04/2012 07:43 hours  Findings: Cardiac shadow is stable.  There is some left basilar infiltrate stable in appearance.  There has been some improvement in the degree of central vascular congestion when compared with the prior exam.  IMPRESSION: Improved appearance consistent with edema improvement following dialysis. There are persistent bibasilar changes worst on the left.   Original Report Authenticated By: Alcide Clever, M.D.   Dg Chest 2 View  07/04/2012   *RADIOLOGY REPORT*  Clinical Data: Shortness of breath and cough.  CHEST - 2 VIEW  Comparison: PA and lateral chest 07/05/2011.  Findings: The patient has small bilateral pleural effusions, larger on the left.  Bilateral airspace disease also appears worse on the left.  Heart size is upper normal.  No pneumothorax.  IMPRESSION: Bilateral airspace disease and small effusions, worse on the left, are likely due to asymmetric edema rather than pneumonia.   Original Report Authenticated By: Holley Dexter, M.D.   Ct Head Wo  Contrast  07/05/2012   *RADIOLOGY REPORT*  Clinical Data: Altered mental status  CT HEAD WITHOUT CONTRAST  Technique:  Contiguous axial images were obtained from the base of the skull through the vertex without contrast.  Comparison: Head CT 05/14/2007  Findings: The patient is moving within the scanner.  Repeat exams were performed with no significant improvement. The exam is suboptimal.  There is a hypodense lesion within the right cerebellum.  No intracranial hemorrhage.   There is a rounded lesion within the CSF space adjacent to the right aspect of the pons which is not  seen on prior.  There is mild ventricular dilatation and periventricular white matter hypodensities  Paranasal sinuses and mastoid air cells are clear.  Orbits are normal.  IMPRESSION:  1.  Age indeterminate right cerebellar infarction.  This could represent an acute infarction. 2.  New high density rounded lesion right adjacent to the pons. This could represent the dolichoectasia or aneurysm of the vertebral artery. A meningioma is a secondary consideration.   Original Report Authenticated By: Genevive Bi, M.D.         Subjective: Patient grimaces to protopathic stimuli. Intermittent opens eyes. Does not follow commands or answer questions this morning. No respiratory distress or diarrhea reported.  Objective: Filed Vitals:   07/06/12 0307 07/06/12 0635 07/06/12 0652 07/06/12 0705  BP: 149/93 184/112 173/57 181/44  Pulse: 60 67 71 63  Temp: 96.7 F (35.9 C) 97.5 F (36.4 C) 97.7 F (36.5 C)   TempSrc: Axillary Axillary Axillary   Resp: 20 20 23 23   Height:      Weight:   58.8 kg (129 lb 10.1 oz)   SpO2: 95% 97% 100% 100%    Intake/Output Summary (Last 24 hours) at 07/06/12 0730 Last data filed at 07/05/12 1700  Gross per 24 hour  Intake    360 ml  Output      0 ml  Net    360 ml   Weight change: -0.422 kg (-14.9 oz) Exam:   General:  Pt grimaces to protopathic stimuli only. not in acute  distress  HEENT: No icterus,  Deputy/AT, no meningismus  Cardiovascular: RRR, S1/S2, no rubs, no gallops  Respiratory: Bibasilar crackles. No wheezes. Good air movement.  Abdomen: Soft/+BS,, non distended  Extremities: No edema, No lymphangitis, No petechiae, No rashes, no synovitis  Data Reviewed: Basic Metabolic Panel:  Recent Labs Lab 07/04/12 0724 07/04/12 1645 07/05/12 0542 07/05/12 2208  NA 134*  --  138 135  K 3.2*  --  3.6 4.0  CL 93*  --  98 95*  CO2 25  --  28 26  GLUCOSE 112*  --  151* 122*  BUN 37*  --  26* 33*  CREATININE 5.39* 2.77* 3.98* 4.74*  CALCIUM 8.6  --  8.0* 8.8  PHOS  --   --  2.9  --    Liver Function Tests:  Recent Labs Lab 07/04/12 0724 07/05/12 0542 07/05/12 2208  AST 21  --  19  ALT 15  --  13  ALKPHOS 96  --  85  BILITOT 0.4  --  0.4  PROT 7.9  --  7.8  ALBUMIN 3.6 2.7* 3.4*    Recent Labs Lab 07/04/12 1008  LIPASE 79*  AMYLASE 70   No results found for this basename: AMMONIA,  in the last 168 hours CBC:  Recent Labs Lab 07/04/12 0724 07/04/12 1645 07/05/12 0542 07/05/12 2208  WBC 13.9* 11.5* 10.3 16.7*  NEUTROABS 10.7*  --   --  12.9*  HGB 12.3 13.0 10.3* 12.0  HCT 35.6* 38.2 31.0* 35.0*  MCV 93.7 94.6 95.7 94.9  PLT 284 242 262 264   Cardiac Enzymes:  Recent Labs Lab 07/04/12 0930 07/04/12 1645 07/04/12 2300 07/05/12 0542  TROPONINI <0.30 <0.30 <0.30 <0.30   BNP: No components found with this basename: POCBNP,  CBG: No results found for this basename: GLUCAP,  in the last 168 hours  Recent Results (from the past 240 hour(s))  CULTURE, BLOOD (ROUTINE X 2)     Status: None   Collection Time    07/04/12  9:30 AM      Result Value Range Status   Specimen Description BLOOD LEFT HAND   Final   Special Requests BOTTLES DRAWN AEROBIC ONLY 9CC   Final   Culture  Setup Time 07/04/2012 13:48   Final   Culture     Final   Value:        BLOOD CULTURE RECEIVED NO GROWTH TO DATE CULTURE WILL BE HELD FOR 5 DAYS  BEFORE ISSUING A FINAL NEGATIVE REPORT   Report Status PENDING   Incomplete  CULTURE, BLOOD (ROUTINE X 2)     Status: None   Collection Time    07/04/12  9:42 AM      Result Value Range Status   Specimen Description BLOOD LEFT HAND   Final   Special Requests BOTTLES DRAWN AEROBIC ONLY 1CC   Final   Culture  Setup Time 07/04/2012 13:48   Final   Culture     Final   Value:        BLOOD CULTURE RECEIVED NO GROWTH TO DATE CULTURE WILL BE HELD FOR 5 DAYS BEFORE ISSUING A FINAL NEGATIVE REPORT   Report Status PENDING   Incomplete     Scheduled Meds: . amLODipine  10 mg Oral Daily  . aspirin EC  81 mg Oral Daily  . atenolol  50 mg Oral Daily  . atorvastatin  40 mg Oral q1800  . ceFEPime (MAXIPIME) IV  2 g Intravenous Q T,Th,Sa-HD  . darbepoetin (ARANESP) injection - DIALYSIS  25 mcg Intravenous Q Thu-HD  . doxercalciferol  2 mcg Intravenous Q T,Th,Sa-HD  . isosorbide-hydrALAZINE  1 tablet Oral BID  . multivitamin with minerals  1 tablet Oral Daily  . piperacillin-tazobactam (ZOSYN)  IV  3.375 g Intravenous Once  . potassium chloride  30 mEq Oral Once  . sodium chloride  3 mL Intravenous Q12H  . sodium chloride  3 mL Intravenous Q12H  . vancomycin  500 mg Intravenous Q T,Th,Sa-HD   Continuous Infusions:    Hydia Copelin, DO  [facility] Pager [phone]  If 7PM-7AM, please contact night-coverage [email or web address] Password [email or web address] 07/06/2012, 7:30 AM   LOS: 2 days

## 2012-07-06 NOTE — Procedures (Addendum)
Indication: Altered Mental status  Risks of the procedure were dicussed with the patient including post-LP headache, bleeding, infection, weakness/numbness of legs(radiculopathy), death.  The patient/patient's proxy agreed and written consent was obtained.   The patient was prepped and draped, and using sterile technique a 20 gauge quinke spinal needle was inserted at the L3-4, L4-5 and L5,-S1 spaces. Despite multiple attempts, no CSF was obtained.   At this time, I have discussed her case with IR and will plan to have her done with fluoroscopy. Treating empirically with acyclovir until this can be performed.

## 2012-07-06 NOTE — Progress Notes (Signed)
EEG intiially showed sharply contoured waves with bifrontal presence, but left frontal predominance occurring at a frequency of 1 - 2.25 Hz. She was treated with ativan and this pattern had some improvement, htough there was still intermittent sharp activity as well as rhythmic bifrontal activity despite keppra, fosphenytoin and ativan.  Given this, and the fact that no other clear cause for AMS is present, I suspect continued non-convulsive seizure activity. In this setting, I feel it would be prudent to start propofol with the goal of a brief period of burst suppression.    Ritta Slot, MD [facility] [phone]  If 7pm- 7am, please page neurology on call at [phone].

## 2012-07-06 NOTE — Procedures (Signed)
I have personally attended this patient's dialysis session.  Although tolerating HD technically, she is obtunded (progressive AMS over past 18 hours), having myoclonus, spontaneous jerking face, not responsive.  Neuro present. They would like to do EEG ASAP.  She has 1'41" of HD to go - will stop at this point (K is 3.6, oxygenating OK with O2)  Loreta Blouch B

## 2012-07-06 NOTE — Progress Notes (Signed)
Event: Notified by RN that pt has had increasing AMS since earlier today. Now pt is difficult to arouse. Dr Tat was notified of this prior to shift change and did come to see her. A Ct head w/o cm was ordered and has been done. RN request I review results and come assess pt. Subjective: Pt unable to provide information d/t AMS. RN and son reports that this am pt was talking. Stating that she did not feel well and refusing to eat much. He has noted increasing lethargy through-out the day. Son also reports that several yrs prior to current hospitalization pt had several episodes of "blacking out" for which she had been evaluated many times. Objective: Ms. Iacovelli is a 79 y.o. female with pmh of ESRD, HTN, HLD, AOCD who presented to ED on 07/04/12 c/o increased SOB and non productive cough. Patient reported symptoms had been present for the last 1-2 weeks and initially associated with nausea/vomiting and loose stools. N/V have resolved but loose stools persist. She also c/o of general malaise, weakness and cough. Patient denied CP, fever, chills, dysuria, melena, hematochezia and hematemesis. In ED CXR demonstrating bilateral infiltrates (concerning for PNA vs edema); BNP was elevated and had WBC's in the 14,000 range. Pt was afebrile on admission and remains afebrile. At bedside pt noted very lethargic but will open eyes to deep tactile stimulation. Pt resist when attempts are made to grip hands or feet. Jerking type movements noted of all 4 extremities that are both spontaneous and can be elicited when an extremity is moved passively. PEARRL. Current VSS. Ct reveals Age indeterminate (R) cerebellar infarct and a new lesion adjacent to the pons. Assessment/Plan: 1. AMS: Acute encephalopathy, etiology unclear. Discussed pt with Dr Roseanne Reno with [facility] who recommended MRI brain w/o CM. MRI was obtained as well as repeat labs which revealed leukocytosis of 16.7 from 10.3 earlier today. Dr Roseanne Reno has  reviewed the MRI and has agreed to evaluate pt. Will continue to monitor closely.   Leanne Chang, NP-C [facility] Pager [phone]

## 2012-07-06 NOTE — Consult Note (Signed)
PULMONARY  / CRITICAL CARE MEDICINE  Name: Christine Graham MRN: [id number] DOB: 1925/10/13    ADMISSION DATE:  07/04/2012 CONSULTATION DATE:  07/06/12  REFERRING MD :  Dr Tat PRIMARY SERVICE: [facility]  CHIEF COMPLAINT:  Altered MS  BRIEF PATIENT DESCRIPTION:  79 yo woman, hx ESRD, HTN, hyperlipidemia. Admitted with apparent viral syndrome, leukocytosis, edema pattern on CXR. Treated w empiric abx for HCAP. Since 5/27 progressive MS change to obtundation by am 5/29. EEG with epileptiform pattern. Brain MRI 5/28 with old CVA's, nothing acute. Received ativan and dilantin, PCCM called to address impending resp failure due to MS.   SIGNIFICANT EVENTS / STUDIES:  Head CT 5/28 >>  Brain MRI 5/28 >>   LINES / TUBES:   CULTURES: Blood 5/27 >>  Blood 5/29 >>  Sputum 5/27 >>   ANTIBIOTICS: Vanco 5/27 >>  Cefepime 5/27 >>  Zosyn 5/29 >>   HISTORY OF PRESENT ILLNESS:   79 yo woman, hx ESRD, HTN, hyperlipidemia. Admitted with apparent viral syndrome, leukocytosis, edema pattern on CXR. Treated w empiric abx for HCAP. Since 5/27 progressive MS change to obtundation by am 5/29. EEG with epileptiform pattern. Brain MRI 5/28 with old CVA's, nothing acute. Received ativan and dilantin, PCCM called to address impending resp failure due to MS.   PAST MEDICAL HISTORY :  Past Medical History  Diagnosis Date  . Hyperlipidemia   . Hypertension   . Gout   . Chronic kidney disease   . Anemia    Past Surgical History  Procedure Laterality Date  . Appendectomy    . Cholecystectomy    . Total hip arthroplasty    . Breast surgery      benign breast biopsy  . Av fistula placement  10/16/2010    (R) UA AVF  . Hip closed reduction  07/21/2011    Procedure: CLOSED REDUCTION HIP;  Surgeon: Toni Arthurs, MD;  Location: [facility] OR;  Service: Orthopedics;  Laterality: Right;   Prior to Admission medications   Medication Sig Start Date End Date Taking? Authorizing Provider  acetaminophen (TYLENOL) 325 MG tablet  Take 650 mg by mouth every 6 (six) hours as needed. For pain/fever   Yes Historical Provider, MD  ALPRAZolam (XANAX) 0.25 MG tablet Take 1 tablet (0.25 mg total) by mouth 2 (two) times daily as needed. For anxiety 06/12/12 06/12/13 Yes Bruce Romilda Garret, MD  amLODipine (NORVASC) 10 MG tablet Take 1 tablet (10 mg total) by mouth daily. 03/29/12  Yes Lindley Magnus, MD  amoxicillin (AMOXIL) 500 MG capsule Take 500 mg by mouth 4 (four) times daily.   Yes Historical Provider, MD  aspirin EC 81 MG tablet Take 81 mg by mouth daily.   Yes Historical Provider, MD  atenolol (TENORMIN) 50 MG tablet Take 50 mg by mouth daily.   Yes Historical Provider, MD  Multiple Vitamin (MULTIVITAMIN WITH MINERALS) TABS Take 1 tablet by mouth daily.   Yes Historical Provider, MD  rosuvastatin (CRESTOR) 20 MG tablet Take 20 mg by mouth daily.   Yes Historical Provider, MD   Allergies  Allergen Reactions  . Codeine Nausea Only  . Oxycodone Nausea Only    FAMILY HISTORY:  Family History  Problem Relation Age of Onset  . Heart disease Father     MI  . Arthritis Mother   . Stroke Mother    SOCIAL HISTORY:  reports that she has never smoked. She does not have any smokeless tobacco history on file. She reports that she  does not drink alcohol or use illicit drugs.  REVIEW OF SYSTEMS:  Pt unable to give a history  SUBJECTIVE:  Unresponsive, getting EEG currently  VITAL SIGNS: Temp:  [96.7 F (35.9 C)-98.5 F (36.9 C)] 98.5 F (36.9 C) (05/29 0934) Pulse Rate:  [58-71] 65 (05/29 1106) Resp:  [17-65] 20 (05/29 1106) BP: (110-184)/(44-113) 149/49 mmHg (05/29 1106) SpO2:  [92 %-100 %] 100 % (05/29 0900) Weight:  [58.378 kg (128 lb 11.2 oz)-58.8 kg (129 lb 10.1 oz)] 58.8 kg (129 lb 10.1 oz) (05/29 0652) HEMODYNAMICS:   VENTILATOR SETTINGS:   INTAKE / OUTPUT: Intake/Output     05/28 0701 - 05/29 0700 05/29 0701 - 05/30 0700   P.O. 360    Total Intake(mL/kg) 360 (6.1)    Other  1360   Total Output   1360   Net  +360 -1360          PHYSICAL EXAMINATION: General:  Ill appearing elderly woman Neuro:  Obtunded, grimace to pain, some symmetrical facial twitching HEENT:  OP clear, some snoring respirations Cardiovascular:  Regular no M Lungs:  Small volumes, few scatter B crackles Abdomen:  Softy, benign Musculoskeletal:  Trace pretibial edema Skin:  Warm, no rash  LABS:  Recent Labs Lab 07/04/12 0724  07/04/12 1645 07/04/12 2300 07/05/12 0542 07/05/12 2208 07/06/12 0500 07/06/12 0932  HGB 12.3  --  13.0  --  10.3* 12.0 11.1*  --   WBC 13.9*  --  11.5*  --  10.3 16.7* 13.3*  --   PLT 284  --  242  --  262 264 239  --   NA 134*  --   --   --  138 135 134*  --   K 3.2*  --   --   --  3.6 4.0 3.6  --   CL 93*  --   --   --  98 95* 95*  --   CO2 25  --   --   --  28 26 22   --   GLUCOSE 112*  --   --   --  151* 122* 119*  --   BUN 37*  --   --   --  26* 33* 36*  --   CREATININE 5.39*  --  2.77*  --  3.98* 4.74* 5.23*  --   CALCIUM 8.6  --   --   --  8.0* 8.8 8.4  --   PHOS  --   --   --   --  2.9  --   --   --   AST 21  --   --   --   --  19  --   --   ALT 15  --   --   --   --  13  --   --   ALKPHOS 96  --   --   --   --  85  --   --   BILITOT 0.4  --   --   --   --  0.4  --   --   PROT 7.9  --   --   --   --  7.8  --   --   ALBUMIN 3.6  --   --   --  2.7* 3.4*  --   --   TROPONINI  --   < > <0.30 <0.30 <0.30  --   --   --   PROBNP 45870.0*  --   --   --   --   --   --   --  PHART  --   --   --   --   --   --   --  7.522*  PCO2ART  --   --   --   --   --   --   --  34.4*  PO2ART  --   --   --   --   --   --   --  114.0*  < > = values in this interval not displayed. No results found for this basename: GLUCAP,  in the last 168 hours  CXR:  5/27 >> B mild vascular edema pattern  ASSESSMENT / PLAN:  NEUROLOGIC A:  Progressive encephalopathy with apparent status epilepticus on EEG, Brain MRI reassuring, ABG without hypercapnia,  ? Meningitis given WBC P:   - continuous EEG -  ativan and dilantin per Dr Amada Jupiter  - agree w check ammonia  PULMONARY A: Impending respiratory failure due to altered MS B pulm infiltrates consistent with pulm edema P:   - will need her airway protected unless profound improvement with ativan - pulm toilet - on empiric abx for ? HCAP   CARDIOVASCULAR A: Hx HTN P:  - hold meds   RENAL A:  ESRD on HD P:   - renal managing  GASTROINTESTINAL A:  SUP P:   - protonix  HEMATOLOGIC A:  No acute issues P:  - follow CBC  INFECTIOUS A:  B infiltrates, ? HCAP. Appearance more consistent with pulm edema\ Altered MS with seizures and leukocytosis > concerning for meningitis P:   - agree with current broad abx although note that cefepime can lower the seizure threshold and may need to be changed.  - will likely need LP 5/29  ENDOCRINE A:  No acute issues P:   - monitor CBG's    TODAY'S SUMMARY:   I have personally obtained a history, examined the patient, evaluated laboratory and imaging results, formulated the assessment and plan and placed orders.  CRITICAL CARE: The patient is critically ill with multiple organ systems failure and requires high complexity decision making for assessment and support, frequent evaluation and titration of therapies, application of advanced monitoring technologies and extensive interpretation of multiple databases. Critical Care Time devoted to patient care services described in this note is 60 minutes.    Levy Pupa, MD, PhD 07/06/2012, 12:06 PM [facility] [phone] or if no answer [phone]

## 2012-07-06 NOTE — Progress Notes (Signed)
Notes reviewed from 07/06/2012. Patient arrived to hemodialysis. She responded only to venous cannulation with facial grimace. Twitching observed this am at 0700. MD is now at bedside in Hemodialysis.

## 2012-07-06 NOTE — Progress Notes (Signed)
eLink Physician-Brief Progress Note Patient Name: Christine Graham DOB: [birth year] MRN: [id number]  Date of Service  07/06/2012   HPI/Events of Note  Neuro wants to keep high dose propofol for seizures hypotension   eICU Interventions  NS bolus Add levophed   Intervention Category Major Interventions: Hypotension - evaluation and management  Rahkeem Senft V. 07/06/2012, 8:22 PM

## 2012-07-06 NOTE — Consult Note (Signed)
NEURO HOSPITALIST CONSULT NOTE    Reason for Consult: Altered mental status.  HPI:                                                                                                                                          Christine Graham is an 79 y.o. female history chronic kidney disease on hemodialysis, hyperlipidemia, hypertension and gout who was admitted on 07/04/2012 for shortness of breath and nonproductive cough, likely acute pneumonia. WBC count was elevated at 14,000. Patient was afebrile. She is on antibiotic therapy with cefepime, Zosyn and vancomycin. She experienced a change in mental status starting at around noon today with progressive confusion then progressed to reduced responsiveness to being stuporous and somewhat agitated. 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 This was thought to possibly be dilated vertebral artery or possibly a meningioma. Subsequent MRI was performed. Official results are pending. No obvious acute stroke was seen on DWI images. Family has a history of episodes of loss of consciousness which have occurred primarily during dialysis. She's had one other episode of loss of consciousness while driving. No seizure activity has been reported. Repeat EC tablet today was 16,700. Electrolytes were unremarkable. Glucose was 122. BUN was 33 and creatinine was 4.74. Liver enzymes are normal. Patient has remained afebrile.  Past Medical History  Diagnosis Date  . Hyperlipidemia   . Hypertension   . Gout   . Chronic kidney disease   . Anemia     Past Surgical History  Procedure Laterality Date  . Appendectomy    . Cholecystectomy    . Total hip arthroplasty    . Breast surgery      benign breast biopsy  . Av fistula placement  10/16/2010    (R) UA AVF  . Hip closed reduction  07/21/2011    Procedure: CLOSED REDUCTION HIP;  Surgeon: Toni Arthurs,  MD;  Location: [facility] OR;  Service: Orthopedics;  Laterality: Right;    Family History  Problem Relation Age of Onset  . Heart disease Father     MI  . Arthritis Mother   . Stroke Mother     Social History:  reports that she has never smoked. She does not have any smokeless tobacco history on file. She reports that she does not drink alcohol or use illicit drugs.  Allergies  Allergen Reactions  . Codeine Nausea Only  . Oxycodone Nausea Only    MEDICATIONS:  I have reviewed the patient's current medications. Scheduled: . amLODipine  10 mg Oral Daily  . aspirin EC  81 mg Oral Daily  . atenolol  50 mg Oral Daily  . atorvastatin  40 mg Oral q1800  . ceFEPime (MAXIPIME) IV  2 g Intravenous Q T,Th,Sa-HD  . darbepoetin (ARANESP) injection - DIALYSIS  25 mcg Intravenous Q Thu-HD  . doxercalciferol  2 mcg Intravenous Q T,Th,Sa-HD  . isosorbide-hydrALAZINE  1 tablet Oral BID  . multivitamin with minerals  1 tablet Oral Daily  . piperacillin-tazobactam (ZOSYN)  IV  3.375 g Intravenous Once  . potassium chloride  30 mEq Oral Once  . sodium chloride  3 mL Intravenous Q12H  . sodium chloride  3 mL Intravenous Q12H  . vancomycin  500 mg Intravenous Q T,Th,Sa-HD   Continuous:  WUJ:WJXBJY chloride, acetaminophen, acetaminophen, ondansetron (ZOFRAN) IV, ondansetron, sodium chloride   ROS:                                                                                                                                       History obtained from child Review of systems is negative except for presenting symptoms at the time of admission as described above with shortness of breath and cough.    Blood pressure 159/49, pulse 61, temperature 97.6 F (36.4 C), temperature source Axillary, resp. rate 20, height 5' 6.93" (1.7 m), weight 58.378 kg (128 lb 11.2 oz), SpO2  92.00%.   Neurologic Examination:                                                                                                      Patient was stuporous and did not respond verbally. She also received 1 mg of Ativan for agitation during MRI study. She responded to tactile stimulation with resistance and was uncooperative for the most part. Pupils were equal and reacted normally to light. Extraocular movements were intact to oculocephalic maneuvers. Face was symmetrical with no signs of focal weakness. Muscle tone was increased with myoclonic jerks that appeared to be spontaneous as well as movement-induced involving all 4 extremities, upper extremities more than lower extremities. Patient had no abnormal posturing. Deep tendon reflexes were 2+ and symmetrical. Plantar responses were flexor bilaterally.  Lab Results  Component Value Date/Time   CHOL 104 07/05/2012  5:42 AM    Results for orders placed during the hospital encounter of 07/04/12 (from the past 48 hour(s))  CBC WITH DIFFERENTIAL     Status: Abnormal  Collection Time    07/04/12  7:24 AM      Result Value Range   WBC 13.9 (*) 4.0 - 10.5 K/uL   RBC 3.80 (*) 3.87 - 5.11 MIL/uL   Hemoglobin 12.3  12.0 - 15.0 g/dL   HCT 16.1 (*) 09.6 - 04.5 %   MCV 93.7  78.0 - 100.0 fL   MCH 32.4  26.0 - 34.0 pg   MCHC 34.6  30.0 - 36.0 g/dL   RDW 40.9  81.1 - 91.4 %   Platelets 284  150 - 400 K/uL   Neutrophils Relative % 77  43 - 77 %   Neutro Abs 10.7 (*) 1.7 - 7.7 K/uL   Lymphocytes Relative 15  12 - 46 %   Lymphs Abs 2.1  0.7 - 4.0 K/uL   Monocytes Relative 5  3 - 12 %   Monocytes Absolute 0.7  0.1 - 1.0 K/uL   Eosinophils Relative 3  0 - 5 %   Eosinophils Absolute 0.4  0.0 - 0.7 K/uL   Basophils Relative 0  0 - 1 %   Basophils Absolute 0.0  0.0 - 0.1 K/uL  COMPREHENSIVE METABOLIC PANEL     Status: Abnormal   Collection Time    07/04/12  7:24 AM      Result Value Range   Sodium 134 (*) 135 - 145 mEq/L   Potassium 3.2  (*) 3.5 - 5.1 mEq/L   Chloride 93 (*) 96 - 112 mEq/L   CO2 25  19 - 32 mEq/L   Glucose, Bld 112 (*) 70 - 99 mg/dL   BUN 37 (*) 6 - 23 mg/dL   Creatinine, Ser 7.82 (*) 0.50 - 1.10 mg/dL   Calcium 8.6  8.4 - 95.6 mg/dL   Total Protein 7.9  6.0 - 8.3 g/dL   Albumin 3.6  3.5 - 5.2 g/dL   AST 21  0 - 37 U/L   ALT 15  0 - 35 U/L   Alkaline Phosphatase 96  39 - 117 U/L   Total Bilirubin 0.4  0.3 - 1.2 mg/dL   GFR calc non Af Amer 7 (*) >90 mL/min   GFR calc Af Amer 8 (*) >90 mL/min   Comment:            The eGFR has been calculated     using the CKD EPI equation.     This calculation has not been     validated in all clinical     situations.     eGFR's persistently     <90 mL/min signify     possible Chronic Kidney Disease.  PRO B NATRIURETIC PEPTIDE     Status: Abnormal   Collection Time    07/04/12  7:24 AM      Result Value Range   Pro B Natriuretic peptide (BNP) 45870.0 (*) 0 - 450 pg/mL  TROPONIN I     Status: None   Collection Time    07/04/12  9:30 AM      Result Value Range   Troponin I <0.30  <0.30 ng/mL   Comment:            Due to the release kinetics of cTnI,     a negative result within the first hours     of the onset of symptoms does not rule out     myocardial infarction with certainty.     If myocardial infarction is still suspected,     repeat the  test at appropriate intervals.  CULTURE, BLOOD (ROUTINE X 2)     Status: None   Collection Time    07/04/12  9:30 AM      Result Value Range   Specimen Description BLOOD LEFT HAND     Special Requests BOTTLES DRAWN AEROBIC ONLY 9CC     Culture  Setup Time 07/04/2012 13:48     Culture       Value:        BLOOD CULTURE RECEIVED NO GROWTH TO DATE CULTURE WILL BE HELD FOR 5 DAYS BEFORE ISSUING A FINAL NEGATIVE REPORT   Report Status PENDING    CULTURE, BLOOD (ROUTINE X 2)     Status: None   Collection Time    07/04/12  9:42 AM      Result Value Range   Specimen Description BLOOD LEFT HAND     Special Requests  BOTTLES DRAWN AEROBIC ONLY 1CC     Culture  Setup Time 07/04/2012 13:48     Culture       Value:        BLOOD CULTURE RECEIVED NO GROWTH TO DATE CULTURE WILL BE HELD FOR 5 DAYS BEFORE ISSUING A FINAL NEGATIVE REPORT   Report Status PENDING    LIPASE, BLOOD     Status: Abnormal   Collection Time    07/04/12 10:08 AM      Result Value Range   Lipase 79 (*) 11 - 59 U/L  AMYLASE     Status: None   Collection Time    07/04/12 10:08 AM      Result Value Range   Amylase 70  0 - 105 U/L  CBC     Status: Abnormal   Collection Time    07/04/12  4:45 PM      Result Value Range   WBC 11.5 (*) 4.0 - 10.5 K/uL   RBC 4.04  3.87 - 5.11 MIL/uL   Hemoglobin 13.0  12.0 - 15.0 g/dL   HCT 16.1  09.6 - 04.5 %   MCV 94.6  78.0 - 100.0 fL   MCH 32.2  26.0 - 34.0 pg   MCHC 34.0  30.0 - 36.0 g/dL   RDW 40.9  81.1 - 91.4 %   Platelets 242  150 - 400 K/uL  CREATININE, SERUM     Status: Abnormal   Collection Time    07/04/12  4:45 PM      Result Value Range   Creatinine, Ser 2.77 (*) 0.50 - 1.10 mg/dL   Comment: DELTA CHECK NOTED   GFR calc non Af Amer 15 (*) >90 mL/min   GFR calc Af Amer 17 (*) >90 mL/min   Comment:            The eGFR has been calculated     using the CKD EPI equation.     This calculation has not been     validated in all clinical     situations.     eGFR's persistently     <90 mL/min signify     possible Chronic Kidney Disease.  TSH     Status: None   Collection Time    07/04/12  4:45 PM      Result Value Range   TSH 4.339  0.350 - 4.500 uIU/mL  TROPONIN I     Status: None   Collection Time    07/04/12  4:45 PM      Result Value Range   Troponin I <0.30  <  0.30 ng/mL   Comment:            Due to the release kinetics of cTnI,     a negative result within the first hours     of the onset of symptoms does not rule out     myocardial infarction with certainty.     If myocardial infarction is still suspected,     repeat the test at appropriate intervals.  TROPONIN I      Status: None   Collection Time    07/04/12 11:00 PM      Result Value Range   Troponin I <0.30  <0.30 ng/mL   Comment:            Due to the release kinetics of cTnI,     a negative result within the first hours     of the onset of symptoms does not rule out     myocardial infarction with certainty.     If myocardial infarction is still suspected,     repeat the test at appropriate intervals.  STREP PNEUMONIAE URINARY ANTIGEN     Status: None   Collection Time    07/05/12  3:35 AM      Result Value Range   Strep Pneumo Urinary Antigen NEGATIVE  NEGATIVE   Comment:            Infection due to S. pneumoniae     cannot be absolutely ruled out     since the antigen present     may be below the detection limit     of the test.  TROPONIN I     Status: None   Collection Time    07/05/12  5:42 AM      Result Value Range   Troponin I <0.30  <0.30 ng/mL   Comment:            Due to the release kinetics of cTnI,     a negative result within the first hours     of the onset of symptoms does not rule out     myocardial infarction with certainty.     If myocardial infarction is still suspected,     repeat the test at appropriate intervals.  LIPID PANEL     Status: Abnormal   Collection Time    07/05/12  5:42 AM      Result Value Range   Cholesterol 104  0 - 200 mg/dL   Triglycerides 161  <096 mg/dL   HDL 31 (*) >04 mg/dL   Total CHOL/HDL Ratio 3.4     VLDL 21  0 - 40 mg/dL   LDL Cholesterol 52  0 - 99 mg/dL   Comment:            Total Cholesterol/HDL:CHD Risk     Coronary Heart Disease Risk Table                         Men   Women      1/2 Average Risk   3.4   3.3      Average Risk       5.0   4.4      2 X Average Risk   9.6   7.1      3 X Average Risk  23.4   11.0                Use the calculated Patient Ratio  above and the CHD Risk Table     to determine the patient's CHD Risk.                ATP III CLASSIFICATION (LDL):      <100     mg/dL   Optimal       161-096  mg/dL   Near or Above                        Optimal      130-159  mg/dL   Borderline      045-409  mg/dL   High      >811     mg/dL   Very High  RENAL FUNCTION PANEL     Status: Abnormal   Collection Time    07/05/12  5:42 AM      Result Value Range   Sodium 138  135 - 145 mEq/L   Potassium 3.6  3.5 - 5.1 mEq/L   Chloride 98  96 - 112 mEq/L   CO2 28  19 - 32 mEq/L   Glucose, Bld 151 (*) 70 - 99 mg/dL   BUN 26 (*) 6 - 23 mg/dL   Creatinine, Ser 9.14 (*) 0.50 - 1.10 mg/dL   Calcium 8.0 (*) 8.4 - 10.5 mg/dL   Phosphorus 2.9  2.3 - 4.6 mg/dL   Albumin 2.7 (*) 3.5 - 5.2 g/dL   GFR calc non Af Amer 9 (*) >90 mL/min   GFR calc Af Amer 11 (*) >90 mL/min   Comment:            The eGFR has been calculated     using the CKD EPI equation.     This calculation has not been     validated in all clinical     situations.     eGFR's persistently     <90 mL/min signify     possible Chronic Kidney Disease.  CBC     Status: Abnormal   Collection Time    07/05/12  5:42 AM      Result Value Range   WBC 10.3  4.0 - 10.5 K/uL   RBC 3.24 (*) 3.87 - 5.11 MIL/uL   Hemoglobin 10.3 (*) 12.0 - 15.0 g/dL   Comment: REPEATED TO VERIFY   HCT 31.0 (*) 36.0 - 46.0 %   MCV 95.7  78.0 - 100.0 fL   MCH 31.8  26.0 - 34.0 pg   MCHC 33.2  30.0 - 36.0 g/dL   RDW 78.2 (*) 95.6 - 21.3 %   Platelets 262  150 - 400 K/uL  CBC WITH DIFFERENTIAL     Status: Abnormal   Collection Time    07/05/12 10:08 PM      Result Value Range   WBC 16.7 (*) 4.0 - 10.5 K/uL   RBC 3.69 (*) 3.87 - 5.11 MIL/uL   Hemoglobin 12.0  12.0 - 15.0 g/dL   HCT 08.6 (*) 57.8 - 46.9 %   MCV 94.9  78.0 - 100.0 fL   MCH 32.5  26.0 - 34.0 pg   MCHC 34.3  30.0 - 36.0 g/dL   RDW 62.9  52.8 - 41.3 %   Platelets 264  150 - 400 K/uL   Neutrophils Relative % 77  43 - 77 %   Lymphocytes Relative 16  12 - 46 %   Monocytes Relative 5  3 - 12 %   Eosinophils Relative 2  0 - 5 %  Basophils Relative 0  0 - 1 %   Neutro Abs 12.9 (*) 1.7  - 7.7 K/uL   Lymphs Abs 2.7  0.7 - 4.0 K/uL   Monocytes Absolute 0.8  0.1 - 1.0 K/uL   Eosinophils Absolute 0.3  0.0 - 0.7 K/uL   Basophils Absolute 0.0  0.0 - 0.1 K/uL   Smear Review MORPHOLOGY UNREMARKABLE    COMPREHENSIVE METABOLIC PANEL     Status: Abnormal   Collection Time    07/05/12 10:08 PM      Result Value Range   Sodium 135  135 - 145 mEq/L   Potassium 4.0  3.5 - 5.1 mEq/L   Chloride 95 (*) 96 - 112 mEq/L   CO2 26  19 - 32 mEq/L   Glucose, Bld 122 (*) 70 - 99 mg/dL   BUN 33 (*) 6 - 23 mg/dL   Creatinine, Ser 1.61 (*) 0.50 - 1.10 mg/dL   Calcium 8.8  8.4 - 09.6 mg/dL   Total Protein 7.8  6.0 - 8.3 g/dL   Albumin 3.4 (*) 3.5 - 5.2 g/dL   AST 19  0 - 37 U/L   ALT 13  0 - 35 U/L   Alkaline Phosphatase 85  39 - 117 U/L   Total Bilirubin 0.4  0.3 - 1.2 mg/dL   GFR calc non Af Amer 8 (*) >90 mL/min   GFR calc Af Amer 9 (*) >90 mL/min   Comment:            The eGFR has been calculated     using the CKD EPI equation.     This calculation has not been     validated in all clinical     situations.     eGFR's persistently     <90 mL/min signify     possible Chronic Kidney Disease.    Dg Chest 2 View  07/05/2012   *RADIOLOGY REPORT*  Clinical Data: Follow up shortness of breath  CHEST - 2 VIEW  Comparison: 07/04/2012  Findings: Normal heart size. There are low lung volumes.  Bilateral pleural effusions are identified and appear similar to previous exam.  There is moderate interstitial edema.  Review of the visualized osseous structures is significant for thoracic spondylosis.  IMPRESSION:  1.  Moderate CHF.  This is not significantly improved from previous exam.   Original Report Authenticated By: Signa Kell, M.D.   Dg Chest 2 View  07/04/2012   *RADIOLOGY REPORT*  Clinical Data: Bilateral airspace disease versus edema check for improvement following dialysis  CHEST - 2 VIEW  Comparison: 07/04/2012 07:43 hours  Findings: Cardiac shadow is stable.  There is some left basilar  infiltrate stable in appearance.  There has been some improvement in the degree of central vascular congestion when compared with the prior exam.  IMPRESSION: Improved appearance consistent with edema improvement following dialysis. There are persistent bibasilar changes worst on the left.   Original Report Authenticated By: Alcide Clever, M.D.   Dg Chest 2 View  07/04/2012   *RADIOLOGY REPORT*  Clinical Data: Shortness of breath and cough.  CHEST - 2 VIEW  Comparison: PA and lateral chest 07/05/2011.  Findings: The patient has small bilateral pleural effusions, larger on the left.  Bilateral airspace disease also appears worse on the left.  Heart size is upper normal.  No pneumothorax.  IMPRESSION: Bilateral airspace disease and small effusions, worse on the left, are likely due to asymmetric edema rather than pneumonia.   Original Report  Authenticated By: Holley Dexter, M.D.   Ct Head Wo Contrast  07/05/2012   *RADIOLOGY REPORT*  Clinical Data: Altered mental status  CT HEAD WITHOUT CONTRAST  Technique:  Contiguous axial images were obtained from the base of the skull through the vertex without contrast.  Comparison: Head CT 05/14/2007  Findings: The patient is moving within the scanner.  Repeat exams were performed with no significant improvement. The exam is suboptimal.  There is a hypodense lesion within the right cerebellum.  No intracranial hemorrhage.   There is a rounded lesion within the CSF space adjacent to the right aspect of the pons which is not  seen on prior.  There is mild ventricular dilatation and periventricular white matter hypodensities  Paranasal sinuses and mastoid air cells are clear.  Orbits are normal.  IMPRESSION:  1.  Age indeterminate right cerebellar infarction.  This could represent an acute infarction. 2.  New high density rounded lesion right adjacent to the pons. This could represent the dolichoectasia or aneurysm of the vertebral artery. A meningioma is a secondary  consideration.   Original Report Authenticated By: Genevive Bi, M.D.   Assessment/Plan: Altered mental status with encephalopathic state of unclear etiology. There no clinical signs of acute stroke. There are likely multiple contributing etiologies, including renal dysfunction and infectious process. There no indications of seizure activity clinically. Episodes of loss of consciousness described by family members are consistent with likely syncopal spells.  Recommendations: 1. Minimize potentially sedating or mood altering medications. 2. EEG, routine adult, in the a.m. to assess severity of encephalopathy as well as to rule out possible seizure activity, although not likely. 3. No further imaging studies are indicated at this point.  Thank you for asking me to participate in this patient's clinical management. We will continue to follow her with you.  Venetia Maxon M.D. [facility] [phone]  07/06/2012, 12:28 AM

## 2012-07-06 NOTE — Progress Notes (Signed)
Report given to [employer], RN on 3100.  Patient being transferred to ICU from hemodialysis.  Relayed hemodialysis report to receiving RN.  All questions answered.

## 2012-07-06 NOTE — Procedures (Signed)
PROCEDURE NOTE: CVL PLACEMENT  INDICATION:    Monitoring of central venous pressures and/or administration of medications optimally administered in central vein  CONSENT:   Risks of procedure as well as the alternatives were explained to the patient or surrogate. Consent for procedure obtained. A time out was performed to review patient identification, procedure to be performed, correct patient position, medications/allergies/relevent history, required imaging and test results.  PROCEDURE  Maximum sterile technique was used including antiseptics, cap, gloves, gown, hand hygiene, mask and sheet.  Skin prep: Chlorhexidine; local anesthetic administered  A antimicrobial bonded/coated triple lumen catheter was placed in the [place] vein using the Seldinger technique.    EVALUATION:  Blood flow good  Complications: No apparent complications  Patient tolerated the procedure well.  Chest X-ray ordered to verify placement and is pending   Billy Fischer, MD PCCM service Mobile [phone]

## 2012-07-07 DIAGNOSIS — G40901 Epilepsy, unspecified, not intractable, with status epilepticus: Secondary | ICD-10-CM | POA: Diagnosis not present

## 2012-07-07 DIAGNOSIS — G40401 Other generalized epilepsy and epileptic syndromes, not intractable, with status epilepticus: Secondary | ICD-10-CM

## 2012-07-07 LAB — RENAL FUNCTION PANEL
BUN: 23 mg/dL (ref 6–23)
Chloride: 98 mEq/L (ref 96–112)
Glucose, Bld: 151 mg/dL — ABNORMAL HIGH (ref 70–99)
Potassium: 2.8 mEq/L — ABNORMAL LOW (ref 3.5–5.1)

## 2012-07-07 LAB — CBC
MCV: 93 fL (ref 78.0–100.0)
Platelets: 241 10*3/uL (ref 150–400)
RDW: 15.6 % — ABNORMAL HIGH (ref 11.5–15.5)
WBC: 12.1 10*3/uL — ABNORMAL HIGH (ref 4.0–10.5)

## 2012-07-07 LAB — PATHOLOGIST SMEAR REVIEW

## 2012-07-07 LAB — HERPES SIMPLEX VIRUS(HSV) DNA BY PCR
HSV 1 DNA: NOT DETECTED
HSV 2 DNA: NOT DETECTED

## 2012-07-07 MED ORDER — DEXTROSE 5 % IV SOLN: 300.0000 mg | INTRAVENOUS | Status: DC

## 2012-07-07 MED ORDER — FOSPHENYTOIN SODIUM 500 MG PE/10ML IJ SOLN
20.0000 mg/kg | INTRAMUSCULAR | Status: AC
  Administered 2012-07-07: 1176 mg via INTRAVENOUS
  Filled 2012-07-07: qty 23.52

## 2012-07-07 NOTE — Progress Notes (Signed)
ANTIBIOTIC CONSULT NOTE - FOLLOW UP  Pharmacy Consult for Acylcovir Indication: viral encephalitis coverage  Patient Measurements: Height: 5' 6.93" (170 cm) Weight: 129 lb 10.1 oz (58.8 kg) IBW/kg (Calculated) : 61.44  Vital Signs: Temp: 93.9 F (34.4 C) (05/30 0800) Temp src: Rectal (05/30 0800) BP: 155/44 mmHg (05/30 1137) Pulse Rate: 51 (05/30 1137) Intake/Output from previous day: 05/29 0701 - 05/30 0700 In: 1020.2 [I.V.:482.2; IV Piggyback:538] Out: 1400 [Urine:40] Intake/Output from this shift: Total I/O In: 179.2 [I.V.:69.2; IV Piggyback:110] Out: 60 [Urine:60]  Labs:  Recent Labs  07/05/12 2208 07/06/12 0500 07/07/12 0530 07/07/12 0918  WBC 16.7* 13.3* 12.1*  --   HGB 12.0 11.1* 10.7*  --   PLT 264 239 241  --   CREATININE 4.74* 5.23*  --  3.92*   Estimated Creatinine Clearance: 9.7 ml/min (by C-G formula based on Cr of 3.92).  Microbiology: Recent Results (from the past 720 hour(s))  CULTURE, BLOOD (ROUTINE X 2)     Status: None   Collection Time    07/04/12  9:30 AM      Result Value Range Status   Specimen Description BLOOD LEFT HAND   Final   Special Requests BOTTLES DRAWN AEROBIC ONLY 9CC   Final   Culture  Setup Time 07/04/2012 13:48   Final   Culture     Final   Value:        BLOOD CULTURE RECEIVED NO GROWTH TO DATE CULTURE WILL BE HELD FOR 5 DAYS BEFORE ISSUING A FINAL NEGATIVE REPORT   Report Status PENDING   Incomplete  CULTURE, BLOOD (ROUTINE X 2)     Status: None   Collection Time    07/04/12  9:42 AM      Result Value Range Status   Specimen Description BLOOD LEFT HAND   Final   Special Requests BOTTLES DRAWN AEROBIC ONLY 1CC   Final   Culture  Setup Time 07/04/2012 13:48   Final   Culture     Final   Value:        BLOOD CULTURE RECEIVED NO GROWTH TO DATE CULTURE WILL BE HELD FOR 5 DAYS BEFORE ISSUING A FINAL NEGATIVE REPORT   Report Status PENDING   Incomplete  CULTURE, BLOOD (ROUTINE X 2)     Status: None   Collection Time   07/06/12  7:40 AM      Result Value Range Status   Specimen Description BLOOD HEMODIALYSIS LINE   Final   Special Requests BOTTLES DRAWN AEROBIC AND ANAEROBIC 10CC   Final   Culture  Setup Time 07/06/2012 10:18   Final   Culture     Final   Value:        BLOOD CULTURE RECEIVED NO GROWTH TO DATE CULTURE WILL BE HELD FOR 5 DAYS BEFORE ISSUING A FINAL NEGATIVE REPORT   Report Status PENDING   Incomplete  CULTURE, BLOOD (ROUTINE X 2)     Status: None   Collection Time    07/06/12  8:15 AM      Result Value Range Status   Specimen Description BLOOD HEMODIALYSIS LINE   Final   Special Requests BOTTLES DRAWN AEROBIC AND ANAEROBIC 10CC   Final   Culture  Setup Time 07/06/2012 13:14   Final   Culture     Final   Value:        BLOOD CULTURE RECEIVED NO GROWTH TO DATE CULTURE WILL BE HELD FOR 5 DAYS BEFORE ISSUING A FINAL NEGATIVE REPORT   Report  Status PENDING   Incomplete  MRSA PCR SCREENING     Status: None   Collection Time    07/06/12 12:07 PM      Result Value Range Status   MRSA by PCR NEGATIVE  NEGATIVE Final   Comment:            The GeneXpert MRSA Assay (FDA     approved for NASAL specimens     only), is one component of a     comprehensive MRSA colonization     surveillance program. It is not     intended to diagnose MRSA     infection nor to guide or     monitor treatment for     MRSA infections.  CSF CULTURE     Status: None   Collection Time    07/06/12  4:48 PM      Result Value Range Status   Specimen Description CSF   Final   Special Requests NONE   Final   Gram Stain     Final   Value: NO WBC SEEN     NO ORGANISMS SEEN   Culture NO GROWTH   Final   Report Status PENDING   Incomplete   Assessment:   Day # 4 Vancomycin and Cefepime for HCAP coverage. Discontinued today, but will still be on board until post-dialysis 5/31.  Acyclovir for empiric encephalitis/meningitis coverage begun 5/29. Intended q24hrs, but I neglected to change default interval from q8hrs to  q24hrs, so 3 doses given. Informed Dr. Amada Jupiter. Last given ~6am today.  To continue until HSV PCR returns.    Fosphenytoin 20 mg/kg load given 5/29, Dilantin level 9.9 today. Dose repeated this morning.  Albumin 3.4->2.5.  Expect higher free levels with ESRD.  Dr. Alene Mires goal is for upper-end therapeutic levels.  No standing dose yet.  Goal of Therapy:  Appropriate Acyclovir dose for renal function and infection  Plan:   Acyclovir 300 mg (~ 5 mg/kg) IV q24hrs, after HD on HD days. Next scheduled for 5/31 pm.  Will follow up LP studies, culture data, and clinical status.  Dennie Fetters, RPh Pager: [phone] 07/07/2012,12:17 PM

## 2012-07-07 NOTE — Progress Notes (Signed)
PULMONARY  / CRITICAL CARE MEDICINE  Name: Christine Graham MRN: [id number] DOB: 10/18/1925    ADMISSION DATE:  07/04/2012 CONSULTATION DATE:  07/06/12  REFERRING MD :  Dr Tat PRIMARY SERVICE: [facility]  CHIEF COMPLAINT:  Altered MS  BRIEF PATIENT DESCRIPTION:  79 yo woman, hx ESRD, HTN, hyperlipidemia. Admitted with apparent viral syndrome, leukocytosis, edema pattern on CXR. Treated w empiric abx for HCAP. Since 5/27 progressive MS change to obtundation by am 5/29. EEG with epileptiform pattern. Brain MRI 5/28 with old CVA's, nothing acute. Received ativan and dilantin, PCCM called to address impending resp failure due to MS.   SIGNIFICANT EVENTS / STUDIES:  Head CT 5/28 >>  Brain MRI 5/28 >>   LINES / TUBES: ETT 5/29 >>  [facility] CVC 5/29 >>   CULTURES: Blood 5/27 >>  Blood 5/29 >>  Sputum 5/27 >>  CSF bacterial 5/29 >>  CSF HSV 5/29 >>   ANTIBIOTICS: Vanco 5/27 >> 5/30 Cefepime 5/27 >> 5/30 Zosyn 5/28 x1 dose Acyclovir 5/29 >>   HISTORY OF PRESENT ILLNESS:   79 yo woman, hx ESRD, HTN, hyperlipidemia. Admitted with apparent viral syndrome, leukocytosis, edema pattern on CXR. Treated w empiric abx for HCAP. Since 5/27 progressive MS change to obtundation by am 5/29. EEG with epileptiform pattern. Brain MRI 5/28 with old CVA's, nothing acute. Received ativan and dilantin, PCCM called to address impending resp failure due to MS.   SUBJECTIVE/ Interval hx:  Placed on propofol for deep sedation given continued seizures Good burst-suppression reported by Dr Amada Jupiter this am LP without evidence for bacterial meningitis, cxs pending  VITAL SIGNS: Temp:  [97.4 F (36.3 C)-98.5 F (36.9 C)] 97.5 F (36.4 C) (05/30 0400) Pulse Rate:  [45-111] 45 (05/30 0820) Resp:  [11-65] 14 (05/30 0820) BP: (73-198)/(29-140) 128/37 mmHg (05/30 0820) SpO2:  [89 %-100 %] 100 % (05/30 0820) FiO2 (%):  [40 %-100 %] 40 % (05/30 0820) HEMODYNAMICS: CVP:  [3 mmHg-5 mmHg] 3 mmHg VENTILATOR  SETTINGS: Vent Mode:  [-] PRVC FiO2 (%):  [40 %-100 %] 40 % Set Rate:  [14 bmp] 14 bmp Vt Set:  [500 mL-550 mL] 550 mL PEEP:  [5 cmH20] 5 cmH20 Plateau Pressure:  [17 cmH20-22 cmH20] 21 cmH20 INTAKE / OUTPUT: Intake/Output     05/29 0701 - 05/30 0700 05/30 0701 - 05/31 0700   P.O.     I.V. (mL/kg) 482.2 (8.2)    IV Piggyback 538    Total Intake(mL/kg) 1020.2 (17.4)    Urine (mL/kg/hr) 40 (0)    Other 1360 (1)    Total Output 1400     Net -379.8          Urine Occurrence 1 x      PHYSICAL EXAMINATION: General:  Ill appearing elderly woman, sedated, intubated Neuro:  Sedated, unresponsive to voice or stim HEENT:  OP clear, some snoring respirations Cardiovascular:  Regular no M Lungs:  Small volumes, few scatter B crackles Abdomen:  Softy, benign Musculoskeletal:  Trace pretibial edema Skin:  Warm, no rash  LABS:  Recent Labs Lab 07/04/12 0724  07/04/12 1645 07/04/12 2300 07/05/12 0542 07/05/12 2208 07/06/12 0500 07/06/12 0932 07/06/12 1444 07/07/12 0530  HGB 12.3  --  13.0  --  10.3* 12.0 11.1*  --   --  10.7*  WBC 13.9*  --  11.5*  --  10.3 16.7* 13.3*  --   --  12.1*  PLT 284  --  242  --  262 264 239  --   --  241  NA 134*  --   --   --  138 135 134*  --   --   --   K 3.2*  --   --   --  3.6 4.0 3.6  --   --   --   CL 93*  --   --   --  98 95* 95*  --   --   --   CO2 25  --   --   --  28 26 22   --   --   --   GLUCOSE 112*  --   --   --  151* 122* 119*  --   --   --   BUN 37*  --   --   --  26* 33* 36*  --   --   --   CREATININE 5.39*  --  2.77*  --  3.98* 4.74* 5.23*  --   --   --   CALCIUM 8.6  --   --   --  8.0* 8.8 8.4  --   --   --   MG  --   --   --   --   --   --   --   --  2.1  --   PHOS  --   --   --   --  2.9  --   --   --   --   --   AST 21  --   --   --   --  19  --   --   --   --   ALT 15  --   --   --   --  13  --   --   --   --   ALKPHOS 96  --   --   --   --  85  --   --   --   --   BILITOT 0.4  --   --   --   --  0.4  --   --   --   --    PROT 7.9  --   --   --   --  7.8  --   --   --   --   ALBUMIN 3.6  --   --   --  2.7* 3.4*  --   --   --   --   TROPONINI  --   < > <0.30 <0.30 <0.30  --   --   --   --   --   PROBNP 45870.0*  --   --   --   --   --   --   --   --   --   PHART  --   --   --   --   --   --   --  7.522*  --   --   PCO2ART  --   --   --   --   --   --   --  34.4*  --   --   PO2ART  --   --   --   --   --   --   --  114.0*  --   --   < > = values in this interval not displayed. No results found for this basename: GLUCAP,  in the last 168 hours  CXR:  5/27 >> B mild vascular edema pattern  ASSESSMENT /  PLAN:  NEUROLOGIC A:  Progressive encephalopathy with apparent status epilepticus on EEG, Brain MRI reassuring, ABG without hypercapnia,  ? Meningitis given WBC P:   - continuous EEG - ativan and dilantin per Dr Amada Jupiter  - agree w check ammonia  PULMONARY A: Acute Respiratory failure due to altered MS B pulm infiltrates consistent with pulm edema P:   - goal lighten sedation and assess for SBT 5/30 - placed on empiric abx on admission for ? HCAP, suspect edema pattern  CARDIOVASCULAR A: Hx HTN Shock, contributions of volume status and heavy sedation with propofol P:  - norepi ordered 5/29 - check CVP - careful with volume removal w HD - holding BP meds   RENAL A:  ESRD on HD P:   - renal managing, last HD 5/29 (incomplete session) - check renal panel now and in am  GASTROINTESTINAL A:  SUP P:   - protonix  HEMATOLOGIC A:  No acute issues P:  - follow CBC  INFECTIOUS A:  B infiltrates, ? HCAP. Appearance more consistent with pulm edema\ Altered MS with seizures and leukocytosis > LP with no leukocytosis or PMN predominence P:   - d/c cefepime and vanco - continue acyclovir until the HSV PCR is resulted - follow CXR for resolution infiltrates  ENDOCRINE A:  No acute issues P:   - monitor CBG's    TODAY'S SUMMARY:   I have personally obtained a history, examined the  patient, evaluated laboratory and imaging results, formulated the assessment and plan and placed orders.  CRITICAL CARE: The patient is critically ill with multiple organ systems failure and requires high complexity decision making for assessment and support, frequent evaluation and titration of therapies, application of advanced monitoring technologies and extensive interpretation of multiple databases. Critical Care Time devoted to patient care services described in this note is 40 minutes.    Levy Pupa, MD, PhD 07/07/2012, 8:50 AM [facility] [phone] or if no answer [phone]

## 2012-07-07 NOTE — Progress Notes (Signed)
PT Cancellation Note  Patient Details Name: Christine Graham MRN: [id number] DOB: 07-Sep-1925   Cancelled Treatment:    Reason Eval/Treat Not Completed: Medical issues which prohibited therapy.  Patient continues to have decline in mental/medical status.  Now sedated on vent.  Will hold PT today.  Will check on patient early next week to determine appropriateness for PT.     Vena Austria 07/07/2012, 11:55 AM Durenda Hurt. Renaldo Fiddler, [facility] Pager [phone]

## 2012-07-07 NOTE — Progress Notes (Signed)
INITIAL NUTRITION ASSESSMENT  DOCUMENTATION CODES Per approved criteria  -Not Applicable   INTERVENTION: If pt will remain ntubated > 48 hours recommend initiating enteral nutrition support.   NUTRITION DIAGNOSIS: Inadequate oral intake related to inability to eat as evidenced by NPO status.  Goal: Pt to meet >/= 90% of their estimated nutrition needs.   Monitor:  Vent status, weight trend, labs  Reason for Assessment: Ventilator  79 y.o. female  Admitting Dx: Acute respiratory failure  ASSESSMENT: Pt had seizure in HD and transferred to 3100. Pt intubated on continuous EEG with heavy Propofol induced sedation. Pt discussed during ICU rounds and with RN. Pt has been intubated < 24 hr. Per RN pt to have wake up assessment with Neuro after dilantin level comes back then she will possibly have sedation lightened. Meningitis ruled out, per MD poss viral syndrome. Per nephrology pt with nausea and diarrhea x 2 weeks with pulmonary edema.  Pt has son and "boyfriend" but are not here at this time. Per records pt has been on HD for 1 year, unsure of dry weight hx.  Pt had incomplete HD on 5/29 with plans for HD on 5/31.  Patient is currently intubated on ventilator support. Pt has OG tube in place. MV: 7.7 Temp:Temp (24hrs), Avg:96.7 F (35.9 C), Min:93.9 F (34.4 C), Max:97.8 F (36.6 C) Pt has Bair hugger Propofol: 27.3 ml/hr 720 kcal per day from lipids (providing 62% of calorie needs)   Height: Ht Readings from Last 1 Encounters:  07/06/12 5' 6.93" (1.7 m)    Weight: Wt Readings from Last 1 Encounters:  07/06/12 129 lb 10.1 oz (58.8 kg)  PreHD weight 129 lb; Post HD weight 123 lb   Ideal Body Weight: 61.3 kg  % Ideal Body Weight: 96%  Wt Readings from Last 10 Encounters:  07/06/12 129 lb 10.1 oz (58.8 kg)  06/30/12 131 lb (59.421 kg)  05/17/12 133 lb (60.328 kg)  07/21/11 136 lb (61.689 kg)  07/21/11 136 lb (61.689 kg)  01/05/11 132 lb (59.875 kg)  11/18/10 132  lb 11.2 oz (60.192 kg)  11/02/10 130 lb (58.968 kg)  10/07/10 143 lb (64.864 kg)  09/16/10 138 lb (62.596 kg)    Usual Body Weight: unknown  % Usual Body Weight: -  BMI:  Body mass index is 20.35 kg/(m^2). WNL   Estimated Nutritional Needs: Kcal: 1166 Protein: 75-90 grams Fluid: > 1.2 L/day  Skin: no issues noted  Diet Order:   NPO   EDUCATION NEEDS: -No education needs identified at this time   Intake/Output Summary (Last 24 hours) at 07/07/12 1050 Last data filed at 07/07/12 0916  Gross per 24 hour  Intake 1199.44 ml  Output    100 ml  Net 1099.44 ml    Last BM: 5/26   Labs:   Recent Labs Lab 07/05/12 0542 07/05/12 2208 07/06/12 0500 07/06/12 1444 07/07/12 0918  NA 138 135 134*  --  134*  K 3.6 4.0 3.6  --  2.8*  CL 98 95* 95*  --  98  CO2 28 26 22   --  24  BUN 26* 33* 36*  --  23  CREATININE 3.98* 4.74* 5.23*  --  3.92*  CALCIUM 8.0* 8.8 8.4  --  7.8*  MG  --   --   --  2.1  --   PHOS 2.9  --   --   --  2.2*  GLUCOSE 151* 122* 119*  --  151*    CBG (  last 3)  No results found for this basename: GLUCAP,  in the last 72 hours No results found for this basename: HGBA1C    Scheduled Meds: . [START ON 07/08/2012] acyclovir  300 mg Intravenous Q24H  . antiseptic oral rinse  15 mL Mouth Rinse QID  . aspirin  300 mg Rectal Daily  . atorvastatin  40 mg Oral q1800  . chlorhexidine  15 mL Mouth Rinse BID  . darbepoetin (ARANESP) injection - DIALYSIS  25 mcg Intravenous Q Thu-HD  . doxercalciferol  2 mcg Intravenous Q T,Th,Sa-HD  . hydrALAZINE  5 mg Intravenous Q8H  . levETIRAcetam  1,000 mg Intravenous Q12H  . metoprolol  5 mg Intravenous Q6H  . multivitamin  1 tablet Oral QHS  . pantoprazole (PROTONIX) IV  40 mg Intravenous Q24H  . sodium chloride  3 mL Intravenous Q12H  . sodium chloride  3 mL Intravenous Q12H    Continuous Infusions: . norepinephrine (LEVOPHED) Adult infusion 6.5 mcg/min (07/07/12 0830)  . propofol 75 mcg/kg/min (07/07/12  0830)    Past Medical History  Diagnosis Date  . Hyperlipidemia   . Hypertension   . Gout   . Chronic kidney disease   . Anemia     Past Surgical History  Procedure Laterality Date  . Appendectomy    . Cholecystectomy    . Total hip arthroplasty    . Breast surgery      benign breast biopsy  . Av fistula placement  10/16/2010    (R) UA AVF  . Hip closed reduction  07/21/2011    Procedure: CLOSED REDUCTION HIP;  Surgeon: Toni Arthurs, MD;  Location: [facility] OR;  Service: Orthopedics;  Laterality: Right;    Kendell Bane RD, LDN, CNSC [phone] Pager [phone] After Hours Pager

## 2012-07-07 NOTE — Progress Notes (Addendum)
Subjective:  Events of past 24 hours reviewed (surrounding progressive decline in MS to obtundationwith diffuse myoclonus) Intubated for airway protection, LP (on empiric antiviral coverage), epileptiform activity on EEG, medication induced coma On norepi and propofol Objective Vital signs in last 24 hours: Filed Vitals:   07/07/12 0500 07/07/12 0600 07/07/12 0700 07/07/12 0820  BP: 143/43 133/40 122/38 128/37  Pulse: 45 45 45 45  Temp:      TempSrc:      Resp: 14 14 14 14   Height:      Weight:      SpO2: 100% 100% 100% 100%   Weight change:   Intake/Output Summary (Last 24 hours) at 07/07/12 0949 Last data filed at 07/07/12 0830  Gross per 24 hour  Intake 1089.44 ml  Output    100 ml  Net 989.44 ml   Physical Exam:  BP 128/37  Pulse 45  Temp(Src) 97.5 F (36.4 C) (Oral)  Resp 14  Ht 5' 6.93" (1.7 m)  Wt 58.8 kg (129 lb 10.1 oz)  BMI 20.35 kg/m2  SpO2 100% Weights: 07/07/12 No weight today 07/06/12    58.8 kg pre HD 07/04/12  56.1 kg post HD 07/04/12  58.8 kg Pre HD CVP 3 Ill app older woman Intubated, line in right neck On continuous EEG monitor Grossly clear ant Traced pretib edema  Basic Metabolic Panel: (no renal studies today)  Recent Labs Lab 07/04/12 0724 07/04/12 1645 07/05/12 0542 07/05/12 2208 07/06/12 0500  NA 134*  --  138 135 134*  K 3.2*  --  3.6 4.0 3.6  CL 93*  --  98 95* 95*  CO2 25  --  28 26 22   GLUCOSE 112*  --  151* 122* 119*  BUN 37*  --  26* 33* 36*  CREATININE 5.39* 2.77* 3.98* 4.74* 5.23*  CALCIUM 8.6  --  8.0* 8.8 8.4  PHOS  --   --  2.9  --   --    Liver Function Tests:  Recent Labs Lab 07/04/12 0724 07/05/12 0542 07/05/12 2208  AST 21  --  19  ALT 15  --  13  ALKPHOS 96  --  85  BILITOT 0.4  --  0.4  PROT 7.9  --  7.8  ALBUMIN 3.6 2.7* 3.4*    Recent Labs Lab 07/04/12 1008  LIPASE 79*  AMYLASE 70    Recent Labs Lab 07/04/12 0724  07/05/12 0542 07/05/12 2208 07/06/12 0500 07/07/12 0530  WBC 13.9*   < > 10.3 16.7* 13.3* 12.1*  NEUTROABS 10.7*  --   --  12.9*  --   --   HGB 12.3  < > 10.3* 12.0 11.1* 10.7*  HCT 35.6*  < > 31.0* 35.0* 33.5* 31.8*  MCV 93.7  < > 95.7 94.9 94.6 93.0  PLT 284  < > 262 264 239 241  < > = values in this interval not displayed.  Recent Labs Lab 07/04/12 0930 07/04/12 1645 07/04/12 2300 07/05/12 0542  TROPONINI <0.30 <0.30 <0.30 <0.30   Studies/Results: Dg Chest 2 View  07/05/2012   *RADIOLOGY REPORT*  Clinical Data: Follow up shortness of breath  CHEST - 2 VIEW  Comparison: 07/04/2012  Findings: Normal heart size. There are low lung volumes.  Bilateral pleural effusions are identified and appear similar to previous exam.  There is moderate interstitial edema.  Review of the visualized osseous structures is significant for thoracic spondylosis.  IMPRESSION:  1.  Moderate CHF.  This is not significantly  improved from previous exam.   Original Report Authenticated By: Signa Kell, M.D.   Ct Head Wo Contrast  07/05/2012   *RADIOLOGY REPORT*  Clinical Data: Altered mental status  CT HEAD WITHOUT CONTRAST  Technique:  Contiguous axial images were obtained from the base of the skull through the vertex without contrast.  Comparison: Head CT 05/14/2007  Findings: The patient is moving within the scanner.  Repeat exams were performed with no significant improvement. The exam is suboptimal.  There is a hypodense lesion within the right cerebellum.  No intracranial hemorrhage.   There is a rounded lesion within the CSF space adjacent to the right aspect of the pons which is not  seen on prior.  There is mild ventricular dilatation and periventricular white matter hypodensities  Paranasal sinuses and mastoid air cells are clear.  Orbits are normal.  IMPRESSION:  1.  Age indeterminate right cerebellar infarction.  This could represent an acute infarction. 2.  New high density rounded lesion right adjacent to the pons. This could represent the dolichoectasia or aneurysm of the  vertebral artery. A meningioma is a secondary consideration.   Original Report Authenticated By: Genevive Bi, M.D.   Mr Brain Wo Contrast  07/06/2012   *RADIOLOGY REPORT*  Clinical Data: Progressive mental status changes.  Abnormal CT scan.  MRI HEAD WITHOUT CONTRAST  Technique:  Multiplanar, multiecho pulse sequences of the brain and surrounding structures were obtained according to standard protocol without intravenous contrast.  Comparison: CT head without contrast 07/05/2012  Findings: The diffusion weighted images demonstrate no evidence for acute or subacute infarction.  The right inferior cerebellar infarcts are remote.  Moderate atrophy and bilateral periventricular white matter disease is evident bilaterally. Remote lacunar infarcts are present in the basal ganglia bilaterally. A remote lacunar infarct is evident in the left cerebellum.  No acute infarct, hemorrhage, or mass lesion is present.  The ventricles are proportionate to the degree of atrophy.  The globes and orbits are intact. The paranasal sinuses and mastoid air cells are clear.  IMPRESSION:  1.  No acute intracranial abnormality. 2.  The right inferior cerebellar infarct is remote. 3.  Advanced atrophy and white matter disease likely reflects the sequelae of chronic microvascular ischemia in this patient with hypertension and end-stage renal disease. 4.  Remote lacunar infarcts of the basal ganglia and left cerebellum.   Original Report Authenticated By: Marin Roberts, M.D.   Dg Chest Port 1 View  07/06/2012   **ADDENDUM** CREATED: 07/06/2012 17:06:49  Additionally, there is an orogastric tube present, the tip of which appears coiled back on itself in the proximal stomach.  **END ADDENDUM** SIGNED BY: Florencia Reasons, M.D.  07/06/2012   *RADIOLOGY REPORT*  Clinical Data: Central line placement.  PORTABLE CHEST - 1 VIEW  Comparison: Chest x-ray 07/06/2012.  Findings: An endotracheal tube is in place with tip 3.9 cm above the  carina. There is a right-sided subclavian central venous catheter with tip terminating in the distal superior vena cava. No pneumothorax.  Bibasilar opacities may reflect areas of atelectasis and/or consolidation.  Small bilateral pleural effusions.  No evidence of pulmonary edema.  Heart size is within normal limits. Upper mediastinal contours are unremarkable.  Atherosclerosis in the thoracic aorta.  IMPRESSION: 1.  Support apparatus, as above.  No pneumothorax following right subclavian central venous catheter placement. 2.  Persistent bibasilar atelectasis and/or consolidation with small bilateral pleural effusions. 3.  Atherosclerosis.   Original Report Authenticated By: Trudie Reed, M.D.   Dg  Chest Port 1 View  07/06/2012   *RADIOLOGY REPORT*  Clinical Data: Status post intubation.  PORTABLE CHEST - 1 VIEW  Comparison: Chest x-ray 07/05/2012.  Findings: An endotracheal tube is in place with tip 3.1 cm above the carina. Lung volumes are normal.  Worsening bibasilar opacities, which may represent areas of atelectasis and/or consolidation, with superimposed moderate right and small left pleural effusions.  Pulmonary vasculature now appears within normal limits.  Heart size is borderline enlarged.  Mediastinal contours are unremarkable.  Atherosclerosis of the thoracic aorta.  IMPRESSION: 1.  Tip of endotracheal tube appears properly located 3.1 cm above the carina. 2.  Interval resolution of pulmonary edema. 3.  Worsening bibasilar aeration, which may reflect increasing areas of atelectasis and/or consolidation, with increasing moderate right and small left-sided pleural effusions. 4.  Atherosclerosis.   Original Report Authenticated By: Trudie Reed, M.D.   Dg Fluoro Guide Lumbar Puncture  07/06/2012   *RADIOLOGY REPORT*  Clinical Data:  Altered mental status.  DIAGNOSTIC LUMBAR PUNCTURE UNDER FLUOROSCOPIC GUIDANCE  Fluoroscopy time:  1 minute, 29 seconds.  Technique:  Informed consent was obtained  from the patient prior to the procedure, including potential complications of headache, allergy, and pain.   With the patient prone, the lower back was prepped with Betadine.  1% Lidocaine was used for local anesthesia. Lumbar puncture was performed at the L3-4 level using a 20 gauge needle with return of clear CSF with an opening pressure of 10 cm water.   8 ml of CSF were obtained for laboratory studies.  The patient tolerated the procedure well and there were no apparent complications.  IMPRESSION: Technically successful fluoro guided lumbar puncture as above.   Original Report Authenticated By: Charlett Nose, M.D.   Medications: . norepinephrine (LEVOPHED) Adult infusion 6.5 mcg/min (07/07/12 0830)  . propofol 75 mcg/kg/min (07/07/12 0830)   . acyclovir  300 mg Intravenous Q8H  . antiseptic oral rinse  15 mL Mouth Rinse QID  . aspirin  300 mg Rectal Daily  . atorvastatin  40 mg Oral q1800  . chlorhexidine  15 mL Mouth Rinse BID  . darbepoetin (ARANESP) injection - DIALYSIS  25 mcg Intravenous Q Thu-HD  . doxercalciferol  2 mcg Intravenous Q T,Th,Sa-HD  . hydrALAZINE  5 mg Intravenous Q8H  . levETIRAcetam  1,000 mg Intravenous Q12H  . metoprolol  5 mg Intravenous Q6H  . multivitamin  1 tablet Oral QHS  . pantoprazole (PROTONIX) IV  40 mg Intravenous Q24H  . sodium chloride  3 mL Intravenous Q12H  . sodium chloride  3 mL Intravenous Q12H   I  have reviewed scheduled and prn medications.  USUAL OUTPT HD: [facility] at [facility] on [facility]  Primary Nephrologist Eliott Nine  EDW 59 kg  HD Bath 2K 2.5 Ca  Dialyzer F160  Heparin 2000  Access AVF R upper  Hectorol 2 TIW  EPO 2200  Assessment/Plan:  79 yo WF with ESRD, HTN, gout, anemia who presented with nausea and some diarrhea for 2 weeks, pulm edema (+/-infiltrate) treated with dialysis/ultrafiltration and antibiotics  who has developed status epilepticus in the hospital for unclear reasons  Neuro - acute changes in hospital with status epilepticus  on initial EEG, now deeply sedated on anticonvulsants with LP not really suggestive of meningitis and no acute (though lots of chronic) changes on EEG. Etiology of this acute in hospital change not clear to me (seems rather dramatic for an antibiotic lowering of sz threshhold in patient with no prior h/o sz/no  new stroke on CT or MRI but DID receive 4 grams of cefipime in a fairly short time interval inadvertently)...  Or could the whole presentation been some sort of viral syndrome? Neuro/CCM managing  ESRD: [facility].  Stopped HD early 5/29 to facilitate getting EEG done ASAP (K  Was fine) Need to check renal panel today. Vol OK with CVP of 3. Next HD 5/31  Pulm - initial CXR most compatible with edema; intubated for airway protection with CXR post intubation showing improved edema but with basilar ASDz.  Hypertension now hypotensive requiring pressors  Anemia of ESRD: EPO has been at 2200 Aranesp in hosp 25 qthurs  Metabolic Bone Disease:  On hectorol. No binders as phos usually in range.   Nausea/diarrhea - was part of presenting symptom complex. Not sure how relates to course of events in hospital. ? Part of viral syndrome?  Camille Bal, MD [facility] [phone] pager 07/07/2012, 9:49 AM

## 2012-07-07 NOTE — Progress Notes (Signed)
Subjective: Had a good burst suppression pattern overnight.   Exam: Filed Vitals:   07/07/12 0700  BP: 122/38  Pulse: 45  Temp:   Resp: 14   Gen: In bed, intubated MS: Does not awaken to voice, exam confounded by medication induced coma.  [id number], no corneal Further exam confounded by induced coma   Impression: 79 yo F with refractory status epilepticus now s/p 12 hours of burst suppression. I feel that prolonged induced coma in this 79 year old would have a very large mortality and therefore will begin weaning her propofol today. Her LP showed elevated WBC in the initial tube, but on subsequent tubes had normal WBC(blood tinged first tube). No signs on lab workup for cause of status or other reasons for encephalopathy.   Recommendations: 1) Wean propofol to off today to allow awakening after dilantin level.  2) continue dilantin, keppra.  3) Continue acyclovir until HSV by PCR returns, no need for bacterial CNS coverage.   Ritta Slot, MD [facility] [phone]  If 7pm- 7am, please page neurology on call at [phone].

## 2012-07-07 NOTE — Procedures (Signed)
History: 79 year old female with rapid progression to obtundation and coma without clear cause  This continuous EEG records from 05/29 10:30 amd until 5/30 8:25 am with brief interruptions for procedures.   Background: At the onset of recording there are periodic sharply contoured waves predominate in the left frontal region which resemble triphasic waves that occur in frequency varying between 1-2.25 Hz. Following administration of Ativan, the activity became less sharply contoured. There is some other out for and theta activities, but the sharply contoured activity dominates the EEG. Following administration of Dilantin, sharply contoured activity gives way to a disorganized background with rhythmic frontal activity that occurs in the cyclical pattern, interspersed with periods of suppression, though runs of the sharply contour activity are still seen at times. This pattern continues, changing between disorganized background with rhythmic frontal activity, sometimes sharp contoured and the generalized, frontal predominant periodic waves. Shortly after 7 PM, a burst suppression pattern is seen and this continues until the end of the recording, though centrally predominant sharp waves are sometimes seen during periods of suppression  Photic stimulation: Physiologic driving is absent  EEG Abnormalities: 1) Status epilepticus as described above 2) Burst suppression form shortly after 7pm until the end of the recording.   Clinical Interpretation: This EEG is concerning for status epilepticus. Given the frequency and evolution in frequency of the periodic discharges, I do feel that an ictal nature is likely. The progression to a pattern of disorganization with frontal rhythmic activity interspersed with periods of this activity I feel to represent ongoing frontal status epilepticus which ceased after 7 PM and a burst suppression pattern emerged.   Ritta Slot, MD [facility] [phone]  If 7pm- 7am, please page neurology on call at [phone].

## 2012-07-07 NOTE — Progress Notes (Signed)
LTVM 07/06/12 completed 

## 2012-07-08 ENCOUNTER — Inpatient Hospital Stay (HOSPITAL_COMMUNITY): Payer: Medicare Other

## 2012-07-08 DIAGNOSIS — R0602 Shortness of breath: Secondary | ICD-10-CM

## 2012-07-08 LAB — CBC WITH DIFFERENTIAL/PLATELET
Basophils Relative: 0 % (ref 0–1)
Hemoglobin: 10.5 g/dL — ABNORMAL LOW (ref 12.0–15.0)
Lymphs Abs: 2 10*3/uL (ref 0.7–4.0)
Monocytes Relative: 7 % (ref 3–12)
Neutro Abs: 11.2 10*3/uL — ABNORMAL HIGH (ref 1.7–7.7)
Neutrophils Relative %: 77 % (ref 43–77)
RBC: 3.31 MIL/uL — ABNORMAL LOW (ref 3.87–5.11)

## 2012-07-08 LAB — RENAL FUNCTION PANEL
CO2: 21 mEq/L (ref 19–32)
Chloride: 99 mEq/L (ref 96–112)
GFR calc Af Amer: 9 mL/min — ABNORMAL LOW (ref >90)
GFR calc non Af Amer: 8 mL/min — ABNORMAL LOW (ref >90)
Sodium: 136 mEq/L (ref 135–145)

## 2012-07-08 MED ORDER — HEPARIN SODIUM (PORCINE) 1000 UNIT/ML IJ SOLN
2000.0000 [IU] | Freq: Once | INTRAMUSCULAR | Status: AC
  Administered 2012-07-08: 2000 [IU] via INTRAVENOUS
  Filled 2012-07-08: qty 2

## 2012-07-08 MED ORDER — PHENYTOIN SODIUM 50 MG/ML IJ SOLN
100.0000 mg | Freq: Three times a day (TID) | INTRAMUSCULAR | Status: DC
  Filled 2012-07-08 (×3): qty 2

## 2012-07-08 MED ORDER — LEVETIRACETAM 500 MG PO TABS
500.0000 mg | ORAL_TABLET | Freq: Every day | ORAL | Status: DC
  Administered 2012-07-09 – 2012-07-11 (×3): 500 mg via ORAL
  Filled 2012-07-08 (×3): qty 1

## 2012-07-08 MED ORDER — DOXERCALCIFEROL 4 MCG/2ML IV SOLN
INTRAVENOUS | Status: AC
  Administered 2012-07-08: 2 ug via INTRAVENOUS
  Filled 2012-07-08: qty 2

## 2012-07-08 NOTE — Progress Notes (Signed)
Subjective: EEG does not show clear seizures overnight, I feel most likely encephalopathic EEG at this point.  Stopped propofol last night.   Exam: Filed Vitals:   07/08/12 1000  BP: 142/54  Pulse: 52  Temp:   Resp: 13   Gen: In bed, intubated MS: Does not awaken to voice nor noxious stim CN: PERRL, corneals present bilaterally.  Motor: Extensor posturing of the right arm. Left arm withdraws and left leg flexion vs. Withdrawal. Right leg flexion vs withdrawal.  Sensory: responds to nox stim x 4 .    Impression: 79 yo F with refractory status epilepticus now s/p 12 hours of burst suppression. I feel that prolonged induced coma in this 79 year old would have a very large mortality and therefore will begin weaning her propofol today. Her LP showed elevated WBC in the initial tube, but on subsequent tubes had normal WBC(blood tinged first tube). No signs on lab workup for cause of status or other reasons for encephalopathy.   Of note, cefepime has been associated with non-convulsive status epilepticus with myoclonus, as was seen in this patient. Given this, I feel that cefepime is the likely culprit in her decline.   She may improve over the next few days now that cefepime has been removed, but given the duration of seizure activity and age of the patient, it is also possible that she may not.  Recommendations: 1) dilantin 100mg  TID.  2) continue keppra 3) have d/c'ed acyclovir.    Ritta Slot, MD [facility] [phone]  If 7pm- 7am, please page neurology on call at [phone].

## 2012-07-08 NOTE — Progress Notes (Signed)
PULMONARY  / CRITICAL CARE MEDICINE  Name: Christine Graham MRN: [id number] DOB: Nov 13, 1925    ADMISSION DATE:  07/04/2012 CONSULTATION DATE:  07/06/12  REFERRING MD :  Dr Tat PRIMARY SERVICE: [facility]  CHIEF COMPLAINT:  Altered MS  BRIEF PATIENT DESCRIPTION:  79 yo woman, hx ESRD, HTN, hyperlipidemia. Admitted with apparent viral syndrome, leukocytosis, edema pattern on CXR. Treated w empiric abx for HCAP. Since 5/27 progressive MS change to obtundation by am 5/29. EEG with epileptiform pattern. Brain MRI 5/28 with old CVA's, nothing acute. Received ativan and dilantin, PCCM called to address impending resp failure due to MS.   SIGNIFICANT EVENTS / STUDIES:  Head CT 5/28 >> 1. Age indeterminate right cerebellar infarction. This could represent an acute infarction. 2. New high density rounded lesion right adjacent to the pons. This could represent the dolichoectasia or aneurysm of the vertebral artery. A meningioma is a secondary consideration.  Brain MRI 5/28 >> 1. No acute intracranial abnormality. 2. The right inferior cerebellar infarct is remote. 3. Advanced atrophy and white matter disease likely reflects the sequelae of chronic microvascular ischemia in this patient with hypertension and end-stage renal disease. 4. Remote lacunar infarcts of the basal ganglia and left cerebellum.  LINES / TUBES: ETT 5/29 >>  [facility] CVC 5/29 >>   CULTURES: Blood 5/27 >>  Blood 5/29 >>  Sputum 5/27 >>  CSF bacterial 5/29 >>  CSF HSV 5/29 >> neg  ANTIBIOTICS: Vanco 5/27 >> 5/30 Cefepime 5/27 >> 5/30 Zosyn 5/28 x1 dose Acyclovir 5/29 >> 5/31   SUBJECTIVE/ Interval hx:  Off pressors, off propofol.  No response.  No clear seizures on EEG overnight.   VITAL SIGNS: Temp:  [97.7 F (36.5 C)-98.9 F (37.2 C)] 98.5 F (36.9 C) (05/31 1135) Pulse Rate:  [48-68] 68 (05/31 1200) Resp:  [5-19] 19 (05/31 1200) BP: (113-172)/(37-60) 171/49 mmHg (05/31 1200) SpO2:  [100 %] 100 % (05/31  1200) FiO2 (%):  [40 %] 40 % (05/31 1200) Weight:  [134 lb 0.6 oz (60.8 kg)] 134 lb 0.6 oz (60.8 kg) (05/31 0432) HEMODYNAMICS: CVP:  [5 mmHg-20 mmHg] 20 mmHg VENTILATOR SETTINGS: Vent Mode:  [-] PRVC FiO2 (%):  [40 %] 40 % Set Rate:  [14 bmp] 14 bmp Vt Set:  [550 mL] 550 mL PEEP:  [5 cmH20] 5 cmH20 Plateau Pressure:  [18 cmH20-20 cmH20] 20 cmH20 INTAKE / OUTPUT: Intake/Output     05/30 0701 - 05/31 0700 05/31 0701 - 06/01 0700   I.V. (mL/kg) 1206.8 (19.8) 117.9 (1.9)   NG/GT 120    IV Piggyback 110 220   Total Intake(mL/kg) 1436.8 (23.6) 337.9 (5.6)   Urine (mL/kg/hr) 450 (0.3) 105 (0.3)   Other     Total Output 450 105   Net +986.8 +232.9          PHYSICAL EXAMINATION: General:  Ill appearing elderly woman, intubated Neuro:  No response off sedation, occasional bilat foot flicker HEENT:  Ett, resps even non labored  Cardiovascular:  Regular no M Lungs:  Small volumes, few scatter B crackles Abdomen:  Softy, benign Musculoskeletal:  Trace pretibial edema Skin:  Warm, no rash  LABS:  Recent Labs Lab 07/04/12 0724  07/04/12 1645 07/04/12 2300 07/05/12 0542 07/05/12 2208 07/06/12 0500 07/06/12 0932 07/06/12 1444 07/07/12 0530 07/07/12 0918 07/08/12 0500  HGB 12.3  --  13.0  --  10.3* 12.0 11.1*  --   --  10.7*  --  10.5*  WBC 13.9*  --  11.5*  --  10.3 16.7* 13.3*  --   --  12.1*  --  14.5*  PLT 284  --  242  --  262 264 239  --   --  241  --  264  NA 134*  --   --   --  138 135 134*  --   --   --  134* 136  K 3.2*  --   --   --  3.6 4.0 3.6  --   --   --  2.8* 3.7  CL 93*  --   --   --  98 95* 95*  --   --   --  98 99  CO2 25  --   --   --  28 26 22   --   --   --  24 21  GLUCOSE 112*  --   --   --  151* 122* 119*  --   --   --  151* 138*  BUN 37*  --   --   --  26* 33* 36*  --   --   --  23 28*  CREATININE 5.39*  --  2.77*  --  3.98* 4.74* 5.23*  --   --   --  3.92* 4.60*  CALCIUM 8.6  --   --   --  8.0* 8.8 8.4  --   --   --  7.8* 7.6*  MG  --   --   --    --   --   --   --   --  2.1  --   --   --   PHOS  --   --   --   --  2.9  --   --   --   --   --  2.2* 4.5  AST 21  --   --   --   --  19  --   --   --   --   --   --   ALT 15  --   --   --   --  13  --   --   --   --   --   --   ALKPHOS 96  --   --   --   --  85  --   --   --   --   --   --   BILITOT 0.4  --   --   --   --  0.4  --   --   --   --   --   --   PROT 7.9  --   --   --   --  7.8  --   --   --   --   --   --   ALBUMIN 3.6  --   --   --  2.7* 3.4*  --   --   --   --  2.5* 2.5*  TROPONINI  --   < > <0.30 <0.30 <0.30  --   --   --   --   --   --   --   PROBNP 45870.0*  --   --   --   --   --   --   --   --   --   --   --   PHART  --   --   --   --   --   --   --  7.522*  --   --   --   --   PCO2ART  --   --   --   --   --   --   --  34.4*  --   --   --   --   PO2ART  --   --   --   --   --   --   --  114.0*  --   --   --   --   < > = values in this interval not displayed. No results found for this basename: GLUCAP,  in the last 168 hours  CXR:   Dg Chest Port 1 View  07/08/2012   *RADIOLOGY REPORT*  Clinical Data: Intubated  PORTABLE CHEST - 1 VIEW  Comparison: 07/06/2012  Findings: Endotracheal tube tip 2.3 cm above carina.  Nasogastric tube and   right subclavian central line are stable in position. Persistent perihilar and left retrocardiac hazy infiltrates or atelectasis.  Suspect small pleural effusions.  Heart size remains normal.  Atheromatous aorta.  IMPRESSION:  1.  Little change   since previous day's portable chest exam   Original Report Authenticated By: D. Andria Rhein, MD   Dg Chest Port 1 View  07/06/2012   **ADDENDUM** CREATED: 07/06/2012 17:06:49  Additionally, there is an orogastric tube present, the tip of which appears coiled back on itself in the proximal stomach.  **END ADDENDUM** SIGNED BY: Florencia Reasons, M.D.  07/06/2012   *RADIOLOGY REPORT*  Clinical Data: Central line placement.  PORTABLE CHEST - 1 VIEW  Comparison: Chest x-ray 07/06/2012.  Findings: An  endotracheal tube is in place with tip 3.9 cm above the carina. There is a right-sided subclavian central venous catheter with tip terminating in the distal superior vena cava. No pneumothorax.  Bibasilar opacities may reflect areas of atelectasis and/or consolidation.  Small bilateral pleural effusions.  No evidence of pulmonary edema.  Heart size is within normal limits. Upper mediastinal contours are unremarkable.  Atherosclerosis in the thoracic aorta.  IMPRESSION: 1.  Support apparatus, as above.  No pneumothorax following right subclavian central venous catheter placement. 2.  Persistent bibasilar atelectasis and/or consolidation with small bilateral pleural effusions. 3.  Atherosclerosis.   Original Report Authenticated By: Trudie Reed, M.D.   Dg Chest Port 1 View  07/06/2012   *RADIOLOGY REPORT*  Clinical Data: Status post intubation.  PORTABLE CHEST - 1 VIEW  Comparison: Chest x-ray 07/05/2012.  Findings: An endotracheal tube is in place with tip 3.1 cm above the carina. Lung volumes are normal.  Worsening bibasilar opacities, which may represent areas of atelectasis and/or consolidation, with superimposed moderate right and small left pleural effusions.  Pulmonary vasculature now appears within normal limits.  Heart size is borderline enlarged.  Mediastinal contours are unremarkable.  Atherosclerosis of the thoracic aorta.  IMPRESSION: 1.  Tip of endotracheal tube appears properly located 3.1 cm above the carina. 2.  Interval resolution of pulmonary edema. 3.  Worsening bibasilar aeration, which may reflect increasing areas of atelectasis and/or consolidation, with increasing moderate right and small left-sided pleural effusions. 4.  Atherosclerosis.   Original Report Authenticated By: Trudie Reed, M.D.   Dg Fluoro Guide Lumbar Puncture  07/06/2012   *RADIOLOGY REPORT*  Clinical Data:  Altered mental status.  DIAGNOSTIC LUMBAR PUNCTURE UNDER FLUOROSCOPIC GUIDANCE  Fluoroscopy time:  1 minute,  29 seconds.  Technique:  Informed consent was obtained from the patient prior to the procedure, including potential complications of headache, allergy, and  pain.   With the patient prone, the lower back was prepped with Betadine.  1% Lidocaine was used for local anesthesia. Lumbar puncture was performed at the L3-4 level using a 20 gauge needle with return of clear CSF with an opening pressure of 10 cm water.   8 ml of CSF were obtained for laboratory studies.  The patient tolerated the procedure well and there were no apparent complications.  IMPRESSION: Technically successful fluoro guided lumbar puncture as above.   Original Report Authenticated By: Charlett Nose, M.D.    ASSESSMENT / PLAN:  NEUROLOGIC A:  Progressive encephalopathy with apparent status epilepticus on EEG, Brain MRI reassuring, ABG without hypercapnia,  ? Meningitis given WBC - increased WBC on first LP tube, normal on subsequent tubes (blood tinged first tube)  P:   - per neuro ?? All r/t cefepime  - continuous EEG - keppra, dilantin per Dr Amada Jupiter  - acyclovir off   PULMONARY A: Acute Respiratory failure due to altered MS/seziures B pulm infiltrates consistent with pulm edema P:   - mental status does not support SBT even off sedation 5/31 - cont empiric abx on admission for ? HCAP, suspect edema pattern -f/u CXR   CARDIOVASCULAR A: Hx HTN Shock, contributions of volume status and heavy sedation with propofol P:  - off pressors 5/31 - check CVP - careful with volume removal w HD - holding BP meds   RENAL A:  ESRD on HD P:   - renal managing - f/u renal panel   GASTROINTESTINAL A:  SUP P:   - protonix  HEMATOLOGIC A:  No acute issues P:  - follow CBC  INFECTIOUS A:  B infiltrates, ? HCAP.  Altered MS with seizures and leukocytosis > LP with no leukocytosis or PMN predominence P:   - monitor off abx  - HSV PCR neg >> off acyclovir  - follow CXR for resolution infiltrates  ENDOCRINE A:  No  acute issues P:   - monitor CBG's    Need to discuss goals of care with family.  Unsure how much, if any, she may improve given age and duration of seizure activity.     Danford Bad, NP 07/08/2012  12:23 PM Pager: [phone] or [phone]  I have personally obtained a history, examined the patient, evaluated laboratory and imaging results, formulated the assessment and plan and placed orders.  CRITICAL CARE: The patient is critically ill with multiple organ systems failure and requires high complexity decision making for assessment and support, frequent evaluation and titration of therapies, application of advanced monitoring technologies and extensive interpretation of multiple databases. Critical Care Time devoted to patient care services described in this note is 35 minutes.    *Care during the described time interval was provided by me and/or other providers on the critical care team. I have reviewed this patient's available data, including medical history, events of note, physical examination and test results as part of my evaluation.  Patient seen and examined, agree with above note.  I dictated the care and orders written for this patient under my direction.  Alyson Reedy, M.D. [facility] Pulmonary/Critical Care Medicine. Pager: [phone]. After hours pager: [phone].

## 2012-07-08 NOTE — Procedures (Signed)
History: 79 yo F with NCSE  This continuous EEG recorded from 05/30 8:59 am until 05/31 7:51 am   Background: Initially there is a burst suppression pattern with bursts occurring every 10-15 seconds. After 12:30 PM, the bursts become longer, and the suppression periods are shorter. These become component into a background is Disorganized slow activity consisting of delta and theta activity, with some rhythmic theta activity that is frontally predominant, though not with clear evolution. Sharply contoured triphasic waves are seen intermittently, but without rhythmicity. Short periods of suppression continue intermixed with the disorganized rhythms. This pattern continues until the end of the recording, with the exception of some periods after stimulation when the triphasics become periodic sometimes reaching a frequency of 2 Hz. This pattern abates when the stimulation is no longer applied to the patient and EEG appears to changemaking me suspect this represents an arousal during those periods.   Photic stimulation: Physiologic driving is absent  EEG Abnormalities: 1) periodic triphasic waves reaching a frequency of 2 Hz that are short lived and only seen after periods of stimulation. 2) initial burst suppression pattern 3) generalized disorganized slow activity   Clinical Interpretation: This EEG is abnormal consistent with a severe cerebral dysfunction. The burst suppression pattern at the onset is consistent with medication induced coma. The latter part of the recording appears encephalopathic. There are sharply contoured waves seen that become periodic at times. The state dependency of these waves and character of the EEG make me feel that they represent encephalopathy rather than continued NCSE. No definite seizures were recorded during this EEG.   Ritta Slot, MD [facility] [phone]  If 7pm- 7am, please page neurology on call at [phone].

## 2012-07-08 NOTE — Progress Notes (Signed)
LTVM d/ced at 0815.

## 2012-07-08 NOTE — Progress Notes (Addendum)
Subjective:   Remains intubated and on anticonvulant therapy Neuro feels probably no longer in status Feeling is that this neuro issue was most likely due to  cefipime  Norepi and propofol off  Objective Vital signs in last 24 hours: Filed Vitals:   07/08/12 0600 07/08/12 0733 07/08/12 0745 07/08/12 0800  BP: 162/45 172/60 155/38 165/47  Pulse: 51 65 52 59  Temp:    98.9 F (37.2 C)  TempSrc:    Axillary  Resp: 14 14 14 15   Height:      Weight:      SpO2: 100% 100% 100% 100%   Weight change:   Intake/Output Summary (Last 24 hours) at 07/08/12 0910 Last data filed at 07/08/12 0800  Gross per 24 hour  Intake 1319.98 ml  Output    450 ml  Net 869.98 ml   Physical Exam:  BP 165/47  Pulse 59  Temp(Src) 98.9 F (37.2 C) (Axillary)  Resp 15  Ht 5' 6.93" (1.7 m)  Wt 60.8 kg (134 lb 0.6 oz)  BMI 21.04 kg/m2  SpO2 100%  Weights: 07/08/12  60.8 kg  07/07/12 No weight today 07/06/12    58.8 kg pre HD 07/04/12  56.1 kg post HD 07/04/12  58.8 kg Pre HD  CVP 5 Ill app older woman Intubated, line in right neck, moves legs with stimulation not purposeful Grossly clear ant Abdomen BS present Soft Trace pretib edema No myoclonus Right upper AVF with bruit/thrill   Basic Metabolic Panel: (no renal studies today)  Recent Labs Lab 07/04/12 0724 07/04/12 1645 07/05/12 0542 07/05/12 2208 07/06/12 0500 07/07/12 0918 07/08/12 0500  NA 134*  --  138 135 134* 134* 136  K 3.2*  --  3.6 4.0 3.6 2.8* 3.7  CL 93*  --  98 95* 95* 98 99  CO2 25  --  28 26 22 24 21   GLUCOSE 112*  --  151* 122* 119* 151* 138*  BUN 37*  --  26* 33* 36* 23 28*  CREATININE 5.39* 2.77* 3.98* 4.74* 5.23* 3.92* 4.60*  CALCIUM 8.6  --  8.0* 8.8 8.4 7.8* 7.6*  PHOS  --   --  2.9  --   --  2.2* 4.5   Liver Function Tests:  Recent Labs Lab 07/04/12 0724  07/05/12 2208 07/07/12 0918 07/08/12 0500  AST 21  --  19  --   --   ALT 15  --  13  --   --   ALKPHOS 96  --  85  --   --   BILITOT 0.4  --   0.4  --   --   PROT 7.9  --  7.8  --   --   ALBUMIN 3.6  < > 3.4* 2.5* 2.5*  < > = values in this interval not displayed.  Recent Labs Lab 07/04/12 1008  LIPASE 79*  AMYLASE 70    Recent Labs Lab 07/04/12 0724  07/05/12 2208 07/06/12 0500 07/07/12 0530 07/08/12 0500  WBC 13.9*  < > 16.7* 13.3* 12.1* 14.5*  NEUTROABS 10.7*  --  12.9*  --   --  11.2*  HGB 12.3  < > 12.0 11.1* 10.7* 10.5*  HCT 35.6*  < > 35.0* 33.5* 31.8* 30.8*  MCV 93.7  < > 94.9 94.6 93.0 93.1  PLT 284  < > 264 239 241 264  < > = values in this interval not displayed.  Recent Labs Lab 07/04/12 0930 07/04/12 1645 07/04/12 2300 07/05/12 0542  TROPONINI <0.30 <0.30 <0.30 <0.30   Results for KADI, HESSION (MRN [id number]) as of 07/08/2012 09:24  Ref. Range 07/06/2012 16:48  HSV 1 DNA Latest Range: Not Detected  Not Detected   Results for FONTAINE, KOSSMAN (MRN [id number]) as of 07/08/2012 09:24  Ref. Range 07/07/2012 08:30 07/08/2012 05:00  Phenytoin Lvl Latest Range: 10.0-20.0  9.9 (L) 16.5   Studies/Results: Dg Chest Port 1 View  07/08/2012   *RADIOLOGY REPORT*  Clinical Data: Intubated  PORTABLE CHEST - 1 VIEW  Comparison: 07/06/2012  Findings: Endotracheal tube tip 2.3 cm above carina.  Nasogastric tube and   right subclavian central line are stable in position. Persistent perihilar and left retrocardiac hazy infiltrates or atelectasis.  Suspect small pleural effusions.  Heart size remains normal.  Atheromatous aorta.  IMPRESSION:  1.  Little change   since previous day's portable chest exam   Original Report Authenticated By: D. Andria Rhein, MD   Dg Chest Port 1 View  07/06/2012   **ADDENDUM** CREATED: 07/06/2012 17:06:49  Additionally, there is an orogastric tube present, the tip of which appears coiled back on itself in the proximal stomach.  **END ADDENDUM** SIGNED BY: Florencia Reasons, M.D.  07/06/2012   *RADIOLOGY REPORT*  Clinical Data: Central line placement.  PORTABLE CHEST - 1 VIEW  Comparison:  Chest x-ray 07/06/2012.  Findings: An endotracheal tube is in place with tip 3.9 cm above the carina. There is a right-sided subclavian central venous catheter with tip terminating in the distal superior vena cava. No pneumothorax.  Bibasilar opacities may reflect areas of atelectasis and/or consolidation.  Small bilateral pleural effusions.  No evidence of pulmonary edema.  Heart size is within normal limits. Upper mediastinal contours are unremarkable.  Atherosclerosis in the thoracic aorta.  IMPRESSION: 1.  Support apparatus, as above.  No pneumothorax following right subclavian central venous catheter placement. 2.  Persistent bibasilar atelectasis and/or consolidation with small bilateral pleural effusions. 3.  Atherosclerosis.   Original Report Authenticated By: Trudie Reed, M.D.   Dg Chest Port 1 View  07/06/2012   *RADIOLOGY REPORT*  Clinical Data: Status post intubation.  PORTABLE CHEST - 1 VIEW  Comparison: Chest x-ray 07/05/2012.  Findings: An endotracheal tube is in place with tip 3.1 cm above the carina. Lung volumes are normal.  Worsening bibasilar opacities, which may represent areas of atelectasis and/or consolidation, with superimposed moderate right and small left pleural effusions.  Pulmonary vasculature now appears within normal limits.  Heart size is borderline enlarged.  Mediastinal contours are unremarkable.  Atherosclerosis of the thoracic aorta.  IMPRESSION: 1.  Tip of endotracheal tube appears properly located 3.1 cm above the carina. 2.  Interval resolution of pulmonary edema. 3.  Worsening bibasilar aeration, which may reflect increasing areas of atelectasis and/or consolidation, with increasing moderate right and small left-sided pleural effusions. 4.  Atherosclerosis.   Original Report Authenticated By: Trudie Reed, M.D.   Dg Fluoro Guide Lumbar Puncture  07/06/2012   *RADIOLOGY REPORT*  Clinical Data:  Altered mental status.  DIAGNOSTIC LUMBAR PUNCTURE UNDER FLUOROSCOPIC  GUIDANCE  Fluoroscopy time:  1 minute, 29 seconds.  Technique:  Informed consent was obtained from the patient prior to the procedure, including potential complications of headache, allergy, and pain.   With the patient prone, the lower back was prepped with Betadine.  1% Lidocaine was used for local anesthesia. Lumbar puncture was performed at the L3-4 level using a 20 gauge needle with return of clear CSF with an opening pressure of  10 cm water.   8 ml of CSF were obtained for laboratory studies.  The patient tolerated the procedure well and there were no apparent complications.  IMPRESSION: Technically successful fluoro guided lumbar puncture as above.   Original Report Authenticated By: Charlett Nose, M.D.   Medications: . norepinephrine (LEVOPHED) Adult infusion Stopped (07/08/12 0730)  . propofol Stopped (07/07/12 1200)   . antiseptic oral rinse  15 mL Mouth Rinse QID  . aspirin  300 mg Rectal Daily  . atorvastatin  40 mg Oral q1800  . chlorhexidine  15 mL Mouth Rinse BID  . darbepoetin (ARANESP) injection - DIALYSIS  25 mcg Intravenous Q Thu-HD  . doxercalciferol  2 mcg Intravenous Q T,Th,Sa-HD  . hydrALAZINE  5 mg Intravenous Q8H  . levETIRAcetam  1,000 mg Intravenous Q12H  . metoprolol  5 mg Intravenous Q6H  . multivitamin  1 tablet Oral QHS  . pantoprazole (PROTONIX) IV  40 mg Intravenous Q24H  . sodium chloride  3 mL Intravenous Q12H  . sodium chloride  3 mL Intravenous Q12H   I  have reviewed scheduled and prn medications.  USUAL OUTPT HD: [facility]  Primary Nephrologist Eliott Nine  EDW 59 kg (needs lower EDW d/t edema on presentation/will prob be 56-57 - cont to adj in hosp) HD Bath 2K 2.5 Ca  Dialyzer F160  Heparin 2000  Access AVF R upper  Hectorol 2 TIW  EPO 2200 3 3/4 hours  Assessment/Plan:  79 yo WF with ESRD, HTN, gout, anemia who presented with nausea and some diarrhea for 2 weeks, pulm edema (+/-infiltrate) treated with dialysis/ultrafiltration and  antibiotics  who has developed status epilepticus in the hospital for unclear reasons, now possibly felt d/t cefipime  Neuro - acute changes in hospital with status epilepticus on initial EEG, now off sedation, no evidence meningitis, now new stroke, possible relationship to cefipime.  To remain in phenytoin and Keppra per neuro.   ESRD: [facility].  Stopped HD early 5/29 to facilitate getting EEG done ASAP (K  Was fine) For HD today 4K bath (needs lower EDW d/t edema on presentation/will prob be 56-57 - cont to adj in hosp)  Pulm - initial CXR most compatible with edema; intubated for airway protection with CXR post intubation showing improved edema but with basilar ASDz.  Hypertension  Required pressors now off  Anemia of ESRD: EPO has been at 2200 Aranesp in hosp 25 qthurs  Metabolic Bone Disease:  On hectorol. No binders as phos usually in range.   Nausea/diarrhea - was part of presenting symptom complex. Not sure how relates to course of events in hospital if at all.   Camille Bal, MD [facility] [phone] pager 07/08/2012, 9:10 AM

## 2012-07-09 ENCOUNTER — Inpatient Hospital Stay (HOSPITAL_COMMUNITY): Payer: Medicare Other

## 2012-07-09 LAB — RENAL FUNCTION PANEL
BUN: 14 mg/dL (ref 6–23)
CO2: 23 mEq/L (ref 19–32)
Calcium: 7.6 mg/dL — ABNORMAL LOW (ref 8.4–10.5)
Chloride: 96 mEq/L (ref 96–112)
Creatinine, Ser: 2.86 mg/dL — ABNORMAL HIGH (ref 0.50–1.10)
GFR calc non Af Amer: 14 mL/min — ABNORMAL LOW (ref >90)
Glucose, Bld: 89 mg/dL (ref 70–99)

## 2012-07-09 LAB — CBC
HCT: 30.2 % — ABNORMAL LOW (ref 36.0–46.0)
Hemoglobin: 10.1 g/dL — ABNORMAL LOW (ref 12.0–15.0)
MCH: 31.7 pg (ref 26.0–34.0)
MCV: 94.7 fL (ref 78.0–100.0)
RBC: 3.19 MIL/uL — ABNORMAL LOW (ref 3.87–5.11)

## 2012-07-09 LAB — PHENYTOIN LEVEL, TOTAL: Phenytoin Lvl: 12 ug/mL (ref 10.0–20.0)

## 2012-07-09 LAB — CSF CULTURE W GRAM STAIN
Culture: NO GROWTH
Gram Stain: NONE SEEN

## 2012-07-09 MED ORDER — AMLODIPINE 1 MG/ML ORAL SUSPENSION
5.0000 mg | Freq: Every day | ORAL | Status: DC
  Filled 2012-07-09: qty 5

## 2012-07-09 MED ORDER — JEVITY 1.2 CAL PO LIQD
1000.0000 mL | ORAL | Status: DC
  Administered 2012-07-09 – 2012-07-10 (×2): 1000 mL
  Filled 2012-07-09 (×4): qty 1000

## 2012-07-09 MED ORDER — PHENYTOIN SODIUM 50 MG/ML IJ SOLN
100.0000 mg | Freq: Two times a day (BID) | INTRAMUSCULAR | Status: DC
  Administered 2012-07-09: 100 mg via INTRAVENOUS
  Filled 2012-07-09 (×2): qty 2

## 2012-07-09 MED ORDER — AMLODIPINE BESYLATE 5 MG PO TABS
5.0000 mg | ORAL_TABLET | Freq: Every day | ORAL | Status: DC
  Administered 2012-07-09 – 2012-07-11 (×3): 5 mg via ORAL
  Filled 2012-07-09 (×3): qty 1

## 2012-07-09 MED ORDER — PROPOFOL 10 MG/ML IV EMUL: 5.0000 ug/kg/min | INTRAVENOUS | Status: DC

## 2012-07-09 NOTE — Progress Notes (Signed)
PULMONARY  / CRITICAL CARE MEDICINE  Name: Christine Graham MRN: [id number] DOB: 26-Jul-1925    ADMISSION DATE:  07/04/2012 CONSULTATION DATE:  07/06/12  REFERRING MD :  Dr Tat PRIMARY SERVICE: [facility]  CHIEF COMPLAINT:  Altered MS  BRIEF PATIENT DESCRIPTION:  79 yo woman, hx ESRD, HTN, hyperlipidemia. Admitted with apparent viral syndrome, leukocytosis, edema pattern on CXR. Treated w empiric abx for HCAP. Since 5/27 progressive MS change to obtundation by am 5/29. EEG with epileptiform pattern. Brain MRI 5/28 with old CVA's, nothing acute. Received ativan and dilantin, PCCM called to address impending resp failure due to MS.   SIGNIFICANT EVENTS / STUDIES:  Head CT 5/28 >> 1. Age indeterminate right cerebellar infarction. This could represent an acute infarction. 2. New high density rounded lesion right adjacent to the pons. This could represent the dolichoectasia or aneurysm of the vertebral artery. A meningioma is a secondary consideration. Brain MRI 5/28 >> 1. No acute intracranial abnormality. 2. The right inferior cerebellar infarct is remote. 3. Advanced atrophy and white matter disease likely reflects the sequelae of chronic microvascular ischemia in this patient with hypertension and end-stage renal disease. 4. Remote lacunar infarcts of the basal ganglia and left cerebellum. EEG 5/31>>>This EEG is abnormal consistent with a severe cerebral dysfunction. The burst suppression pattern at the onset is consistent with medication induced coma. The latter part of the recording appears encephalopathic. There are sharply contoured waves seen that become periodic at times. The state dependency of these waves and character of the EEG make me feel that they represent encephalopathy rather than continued NCSE. No definite seizures were recorded during this EEG.    LINES / TUBES: ETT 5/29 >>  [facility] CVC 5/29 >>   CULTURES: Blood 5/27 >>  Blood 5/29 >>  Sputum 5/27 >>few candida>>>  CSF  bacterial 5/29 >>  CSF HSV 5/29 >> neg  ANTIBIOTICS: Vanco 5/27 >> 5/30 Cefepime 5/27 >> 5/30 Zosyn 5/28 x1 dose Acyclovir 5/29 >> 5/31   SUBJECTIVE/ Interval hx:  No further seizures noted.  Remains minimally responsive.   VITAL SIGNS: Temp:  [97.4 F (36.3 C)-99.4 F (37.4 C)] 99.4 F (37.4 C) (06/01 0800) Pulse Rate:  [50-75] 58 (06/01 1100) Resp:  [8-19] 14 (06/01 1100) BP: (116-181)/(35-88) 170/43 mmHg (06/01 1100) SpO2:  [97 %-100 %] 100 % (06/01 1100) FiO2 (%):  [30 %-40 %] 30 % (06/01 0800) Weight:  [128 lb 12 oz (58.4 kg)-137 lb 2 oz (62.2 kg)] 128 lb 12 oz (58.4 kg) (06/01 0500) HEMODYNAMICS: CVP:  [2 mmHg-20 mmHg] 7 mmHg VENTILATOR SETTINGS: Vent Mode:  [-] PRVC FiO2 (%):  [30 %-40 %] 30 % Set Rate:  [14 bmp] 14 bmp Vt Set:  [550 mL] 550 mL PEEP:  [5 cmH20] 5 cmH20 Plateau Pressure:  [18 cmH20-22 cmH20] 22 cmH20 INTAKE / OUTPUT: Intake/Output     05/31 0701 - 06/01 0700 06/01 0701 - 06/02 0700   I.V. (mL/kg) 497.9 (8.5) 100 (1.7)   Other 60 90   NG/GT     IV Piggyback 220    Total Intake(mL/kg) 777.9 (13.3) 190 (3.3)   Urine (mL/kg/hr) 270 (0.2) 40 (0.2)   Other 2000 (1.4)    Total Output 2270 40   Net -1492.1 +150          PHYSICAL EXAMINATION: General:  Ill appearing elderly woman, intubated Neuro:  No response off sedation, occasional bilat foot flicker HEENT:  Ett, resps even non labored  Cardiovascular:  Regular no M  Lungs:  Small volumes, few scatter B crackles Abdomen:  Softy, benign Musculoskeletal:  Trace pretibial edema Skin:  Warm, no rash  LABS:  Recent Labs Lab 07/04/12 0724  07/04/12 1645 07/04/12 2300  07/05/12 0542 07/05/12 2208  07/06/12 0932 07/06/12 1444 07/07/12 0530 07/07/12 0918 07/08/12 0500 07/09/12 0500  HGB 12.3  --  13.0  --   --  10.3* 12.0  < >  --   --  10.7*  --  10.5* 10.1*  WBC 13.9*  --  11.5*  --   --  10.3 16.7*  < >  --   --  12.1*  --  14.5* 11.6*  PLT 284  --  242  --   --  262 264  < >  --    --  241  --  264 229  NA 134*  --   --   --   --  138 135  < >  --   --   --  134* 136 134*  K 3.2*  --   --   --   --  3.6 4.0  < >  --   --   --  2.8* 3.7 3.6  CL 93*  --   --   --   --  98 95*  < >  --   --   --  98 99 96  CO2 25  --   --   --   --  28 26  < >  --   --   --  24 21 23   GLUCOSE 112*  --   --   --   --  151* 122*  < >  --   --   --  151* 138* 89  BUN 37*  --   --   --   --  26* 33*  < >  --   --   --  23 28* 14  CREATININE 5.39*  --  2.77*  --   --  3.98* 4.74*  < >  --   --   --  3.92* 4.60* 2.86*  CALCIUM 8.6  --   --   --   --  8.0* 8.8  < >  --   --   --  7.8* 7.6* 7.6*  MG  --   --   --   --   --   --   --   --   --  2.1  --   --   --   --   PHOS  --   --   --   --   < > 2.9  --   --   --   --   --  2.2* 4.5 3.2  AST 21  --   --   --   --   --  19  --   --   --   --   --   --   --   ALT 15  --   --   --   --   --  13  --   --   --   --   --   --   --   ALKPHOS 96  --   --   --   --   --  85  --   --   --   --   --   --   --   BILITOT 0.4  --   --   --   --   --  0.4  --   --   --   --   --   --   --   PROT 7.9  --   --   --   --   --  7.8  --   --   --   --   --   --   --   ALBUMIN 3.6  --   --   --   --  2.7* 3.4*  --   --   --   --  2.5* 2.5* 2.5*  TROPONINI  --   < > <0.30 <0.30  --  <0.30  --   --   --   --   --   --   --   --   PROBNP 45870.0*  --   --   --   --   --   --   --   --   --   --   --   --   --   PHART  --   --   --   --   --   --   --   --  7.522*  --   --   --   --   --   PCO2ART  --   --   --   --   --   --   --   --  34.4*  --   --   --   --   --   PO2ART  --   --   --   --   --   --   --   --  114.0*  --   --   --   --   --   < > = values in this interval not displayed. No results found for this basename: GLUCAP,  in the last 168 hours  CXR:   Ct Head Wo Contrast  07/08/2012   *RADIOLOGY REPORT*  Clinical Data: Seizure-like activity.  Possible encephalopathy. The patient is not aroused a voice.,.  CT HEAD WITHOUT CONTRAST  Technique:  Contiguous  axial images were obtained from the base of the skull through the vertex without contrast.  Comparison: MRI of the head without contrast 07/05/2012.  Findings: A remote right inferior cerebellar infarct is again noted.  Atrophy and moderate to periventricular white matter disease is evident bilaterally.  No acute cortical infarct, hemorrhage, or mass lesion is present.  There is no significant interval change.  The paranasal sinuses and mastoid air cells are clear.  The osseous skull is intact.  Note is made of hyperostosis frontalis internus.  IMPRESSION:  1.  Stable atrophy and diffuse nonspecific white matter disease. This could be related to a diffuse encephalopathy.  It is not significantly changed since 2009. 2.  Remote right PICA territory infarct.   Original Report Authenticated By: Marin Roberts, M.D.   Dg Chest Portable 1 View  07/09/2012   *RADIOLOGY REPORT*  Clinical Data: Edema versus pneumonia  PORTABLE CHEST - 1 VIEW  Comparison:   the previous day's study  Findings: Endotracheal tube, nasogastric tube, and right subclavian central line are stable in position.  Infrahilar infiltrate or atelectasis, left greater than right, without convincing change from previous exam.  There may be small pleural effusions.  Heart size remains normal.  Spurring in the thoracic spine.  IMPRESSION:  1.  Little change in asymmetric bibasilar atelectasis or infiltrates with possible small effusions. 2. Support hardware stable in position.  Original Report Authenticated By: D. Andria Rhein, MD   Dg Chest Port 1 View  07/08/2012   *RADIOLOGY REPORT*  Clinical Data: Intubated  PORTABLE CHEST - 1 VIEW  Comparison: 07/06/2012  Findings: Endotracheal tube tip 2.3 cm above carina.  Nasogastric tube and   right subclavian central line are stable in position. Persistent perihilar and left retrocardiac hazy infiltrates or atelectasis.  Suspect small pleural effusions.  Heart size remains normal.  Atheromatous aorta.   IMPRESSION:  1.  Little change   since previous day's portable chest exam   Original Report Authenticated By: D. Andria Rhein, MD    ASSESSMENT / PLAN:  NEUROLOGIC A:   Seizures  Progressive encephalopathy with apparent status epilepticus on EEG--  Brain MRI reassuring  ? Meningitis given WBC - increased WBC on first LP tube, normal on subsequent tubes (blood tinged first tube)  P:   - per neuro ?? All r/t cefepime  - continuous EEG complete,  - keppra, dilantin per Dr Amada Jupiter  - acyclovir off - additional HD today per renal to attempt additional drug (Cefepime) removal  PULMONARY A: Acute Respiratory failure due to altered MS/seziures B pulm infiltrates consistent with pulm edema P:   - mental status does not support SBT  - abx off given ?complications of cefepime.  - f/u CXR  - if mental status remains unchanged and family cont to want aggressive care need to address ?trach   CARDIOVASCULAR A: Hx HTN Shock, contributions of volume status and heavy sedation with propofol - resolved P:  - monitor CVP - careful with volume removal w HD - resume home norvasc  - cont metoprolol, hydralazine   RENAL A:  ESRD on HD P:   - renal managing - f/u renal panel   GASTROINTESTINAL A:  SUP P:   - protonix - start TF   HEMATOLOGIC A:  No acute issues P:  - follow CBC  INFECTIOUS A:  B infiltrates, ? HCAP.  Altered MS with seizures and leukocytosis > LP with no leukocytosis or PMN predominence P:   - monitor off abx  - HSV PCR neg >> off acyclovir  - follow CXR for resolution infiltrates  ENDOCRINE A:  No acute issues P:   - monitor CBG's    Need to discuss goals of care with family.  Unsure how much, if any, she may improve given age and duration of seizure activity.    Danford Bad, NP 07/09/2012  11:11 AM Pager: [phone] or [phone]  CRITICAL CARE: The patient is critically ill with multiple organ systems failure and requires high  complexity decision making for assessment and support, frequent evaluation and titration of therapies, application of advanced monitoring technologies and extensive interpretation of multiple databases. Critical Care Time devoted to patient care services described in this note is 35 minutes.   *Care during the described time interval was provided by me and/or other providers on the critical care team. I have reviewed this patient's available data, including medical history, events of note, physical examination and test results as part of my evaluation.  Patient seen and examined, agree with above note.  I dictated the care and orders written for this patient under my direction.  Alyson Reedy, MD [phone]

## 2012-07-09 NOTE — Progress Notes (Signed)
Subjective: No acute events overnight  Exam: Filed Vitals:   07/09/12 0900  BP: 175/43  Pulse: 59  Temp:   Resp: 14   Gen: In bed, NAD MS: Does not open eyes or follow commands.  ZO:XWRUE, corneals intact Motor: withdraws right arm, flexion vs withdrawal left arm, right leg has some voluntary appearing movements in response to noxious stimuli, left leg withdrawal vs flexion.  Sensory:responds to noxious stim in all 4 ext, including grimacing.   Impression: 79 yo F with sudden AMS and initial EEG concerning for NCSE. I suspect that this is cefepime related neurotoxicity and hopefully she will continue to improve now that cefepime has been removed. She did have a prolonged period of NCSE and especially given her age, there is concern that she may not do well. I have discussed this with the family.   Cefepime related neurotoxicity has been reported with both EEGs showing discharges resembling NCSE and with coma with slow EEG not concerning for NCSE. It has been described with multifocal myoclonus and coma in a very similar pattern to this patient which leads me to this conclusion. There is also no other clear provocative factor in her case.   Recommendations: 1)Have discussed with Dr. Eliott Nine regarding additional dialysis to remove any remaining cefepime.  2) Continue keppra 500mg  daily(dose reduced as no longer in SE and provocative agent is removed.  3) Corrected dilantin on the high side, will continue to give, but at reduced dose.  4) dilantin level tomorrow.  5) repeat EEG tomorrow.   Ritta Slot, MD [facility] [phone]  If 7pm- 7am, please page neurology on call at [phone].

## 2012-07-09 NOTE — Progress Notes (Signed)
Dr. Amada Jupiter has raised the question of utility of additional HD for removal of cefipime as he now feels pretty certain this is the etiology for her seizures/obtundation.  Since as much as 77% of cefipime is removed with HD, and her "burden" of drug was high, AND because she seemed somewhat improved after dialysis yesterday, will plan for an extra HD treatment today for purposes of additional drug removal (since we cannot measure levels).

## 2012-07-09 NOTE — Progress Notes (Signed)
Subjective:   Remains intubated and on anticonvulant therapy Responsive to pain Does not awaken or follow any commands Had her routine scheduled HD Saturday and appears tolerated well Objective Vital signs in last 24 hours: Filed Vitals:   07/09/12 0500 07/09/12 0600 07/09/12 0700 07/09/12 0745  BP: 147/40 150/36 181/52 180/45  Pulse: 58 62 64 75  Temp:      TempSrc:      Resp: 14 14 14 14   Height:      Weight: 58.4 kg (128 lb 12 oz)     SpO2: 99% 100% 100% 100%   Weight change: 1.4 kg (3 lb 1.4 oz)  Intake/Output Summary (Last 24 hours) at 07/09/12 0804 Last data filed at 07/09/12 0600  Gross per 24 hour  Intake    720 ml  Output   2210 ml  Net  -1490 ml   Physical Exam:  BP 180/45  Pulse 75  Temp(Src) 98.9 F (37.2 C) (Oral)  Resp 14  Ht 5' 6.93" (1.7 m)  Wt 58.4 kg (128 lb 12 oz)  BMI 20.21 kg/m2  SpO2 100%  Weights: 07/08/12    58.9 post HD 07/08/12  60.8 kg  07/07/12 No weight today 07/06/12    58.8 kg pre HD 07/04/12  56.1 kg post HD 07/04/12  58.8 kg Pre HD CVP 5 Ill app older woman Intubated, line in right neck, moves legs with stimulation not purposeful NG faintly pink drainage Grossly clear ant Abdomen BS present Soft Trace pretib edema No myoclonus Right upper AVF with bruit/thrill   Ecchymotic blood blister right forearm  Basic Metabolic Panel: (no renal studies today)  Recent Labs Lab 07/04/12 0724 07/04/12 1645 07/05/12 0542 07/05/12 2208 07/06/12 0500 07/07/12 0918 07/08/12 0500 07/09/12 0500  NA 134*  --  138 135 134* 134* 136 134*  K 3.2*  --  3.6 4.0 3.6 2.8* 3.7 3.6  CL 93*  --  98 95* 95* 98 99 96  CO2 25  --  28 26 22 24 21 23   GLUCOSE 112*  --  151* 122* 119* 151* 138* 89  BUN 37*  --  26* 33* 36* 23 28* 14  CREATININE 5.39* 2.77* 3.98* 4.74* 5.23* 3.92* 4.60* 2.86*  CALCIUM 8.6  --  8.0* 8.8 8.4 7.8* 7.6* 7.6*  PHOS  --   --  2.9  --   --  2.2* 4.5 3.2   Liver Function Tests:  Recent Labs Lab 07/04/12 0724   07/05/12 2208 07/07/12 0918 07/08/12 0500 07/09/12 0500  AST 21  --  19  --   --   --   ALT 15  --  13  --   --   --   ALKPHOS 96  --  85  --   --   --   BILITOT 0.4  --  0.4  --   --   --   PROT 7.9  --  7.8  --   --   --   ALBUMIN 3.6  < > 3.4* 2.5* 2.5* 2.5*  < > = values in this interval not displayed.  Recent Labs Lab 07/04/12 1008  LIPASE 79*  AMYLASE 70    Recent Labs Lab 07/04/12 0724  07/05/12 2208 07/06/12 0500 07/07/12 0530 07/08/12 0500 07/09/12 0500  WBC 13.9*  < > 16.7* 13.3* 12.1* 14.5* 11.6*  NEUTROABS 10.7*  --  12.9*  --   --  11.2*  --   HGB 12.3  < > 12.0  11.1* 10.7* 10.5* 10.1*  HCT 35.6*  < > 35.0* 33.5* 31.8* 30.8* 30.2*  MCV 93.7  < > 94.9 94.6 93.0 93.1 94.7  PLT 284  < > 264 239 241 264 229  < > = values in this interval not displayed.  Recent Labs Lab 07/04/12 0930 07/04/12 1645 07/04/12 2300 07/05/12 0542  TROPONINI <0.30 <0.30 <0.30 <0.30   Results for Christine Graham, Christine Graham (MRN [id number]) as of 07/08/2012 09:24  Ref. Range 07/06/2012 16:48  HSV 1 DNA Latest Range: Not Detected  Not Detected    Results for Christine Graham, Christine Graham (MRN [id number]) as of 07/09/2012 08:08  Ref. Range 07/08/2012 05:00 07/08/2012 06:26 07/08/2012 12:58 07/09/2012 05:00  Phenytoin Lvl Range: 10.0-20.0 ug/mL 16.5   12.0   Studies/Results: Ct Head Wo Contrast  07/08/2012   *RADIOLOGY REPORT*  Clinical Data: Seizure-like activity.  Possible encephalopathy. The patient is not aroused a voice.,.  CT HEAD WITHOUT CONTRAST  Technique:  Contiguous axial images were obtained from the base of the skull through the vertex without contrast.  Comparison: MRI of the head without contrast 07/05/2012.  Findings: A remote right inferior cerebellar infarct is again noted.  Atrophy and moderate to periventricular white matter disease is evident bilaterally.  No acute cortical infarct, hemorrhage, or mass lesion is present.  There is no significant interval change.  The paranasal sinuses and  mastoid air cells are clear.  The osseous skull is intact.  Note is made of hyperostosis frontalis internus.  IMPRESSION:  1.  Stable atrophy and diffuse nonspecific white matter disease. This could be related to a diffuse encephalopathy.  It is not significantly changed since 2009. 2.  Remote right PICA territory infarct.   Original Report Authenticated By: Marin Roberts, M.D.   Dg Chest Portable 1 View  07/09/2012   *RADIOLOGY REPORT*  Clinical Data: Edema versus pneumonia  PORTABLE CHEST - 1 VIEW  Comparison:   the previous day's study  Findings: Endotracheal tube, nasogastric tube, and right subclavian central line are stable in position.  Infrahilar infiltrate or atelectasis, left greater than right, without convincing change from previous exam.  There may be small pleural effusions.  Heart size remains normal.  Spurring in the thoracic spine.  IMPRESSION:  1.  Little change in asymmetric bibasilar atelectasis or infiltrates with possible small effusions. 2. Support hardware stable in position.   Original Report Authenticated By: D. Andria Rhein, MD   Dg Chest Port 1 View  07/08/2012   *RADIOLOGY REPORT*  Clinical Data: Intubated  PORTABLE CHEST - 1 VIEW  Comparison: 07/06/2012  Findings: Endotracheal tube tip 2.3 cm above carina.  Nasogastric tube and   right subclavian central line are stable in position. Persistent perihilar and left retrocardiac hazy infiltrates or atelectasis.  Suspect small pleural effusions.  Heart size remains normal.  Atheromatous aorta.  IMPRESSION:  1.  Little change   since previous day's portable chest exam   Original Report Authenticated By: D. Andria Rhein, MD   Medications: . norepinephrine (LEVOPHED) Adult infusion Stopped (07/08/12 0730)  . propofol Stopped (07/07/12 1200)   . antiseptic oral rinse  15 mL Mouth Rinse QID  . aspirin  300 mg Rectal Daily  . atorvastatin  40 mg Oral q1800  . chlorhexidine  15 mL Mouth Rinse BID  . darbepoetin (ARANESP)  injection - DIALYSIS  25 mcg Intravenous Q Thu-HD  . doxercalciferol  2 mcg Intravenous Q T,Th,Sa-HD  . hydrALAZINE  5 mg Intravenous Q8H  . levETIRAcetam  500 mg Oral Daily  . metoprolol  5 mg Intravenous Q6H  . multivitamin  1 tablet Oral QHS  . pantoprazole (PROTONIX) IV  40 mg Intravenous Q24H  . sodium chloride  3 mL Intravenous Q12H  . sodium chloride  3 mL Intravenous Q12H   I  have reviewed scheduled and prn medications.  USUAL OUTPT HD: [facility]  Primary Nephrologist Eliott Nine  EDW 59 kg (needs lower EDW d/t edema on presentation/will prob be 56-57 - cont to adj in hosp) HD Bath 2K 2.5 Ca  Dialyzer F160  Heparin 2000  Access AVF R upper  Hectorol 2 TIW  EPO 2200 3 3/4 hours  Assessment/Plan:  79 yo WF with ESRD, HTN, gout, anemia who presented with nausea and some diarrhea for 2 weeks, pulm edema (+/-infiltrate) treated with dialysis/ultrafiltration and antibiotics  who developed status epilepticus in the hospital for unclear reasons, now possibly felt d/t cefipime  Neuro - acute changes in hospital with non convulsive status epilepticus on initial EEG, not felt to be seizing now Off sedation, no evidence meningitis, now new stroke, possible relationship to cefipime.   To remain in phenytoin and Keppra per neuro.   ESRD: [facility]TS [facility].  Stopped HD early 5/29 to facilitate getting EEG done ASAP (K  Was fine) Had HD Saturday 4K bath  Cont TTS (needs lower EDW d/t edema on presentation/will prob be around 57 - cont to adj in hosp)  Pulm - initial CXR most compatible with edema; intubated for airway protection with CXR post intubation showing improved edema but with basilar ASDz.  Hypertension  Required pressors now off/BP generous  Anemia of ESRD: Out EPO had been at 2200 / Aranesp in hosp 25 qthurs  Metabolic Bone Disease:  On hectorol. No binders as phos usually in range.   Nausea/diarrhea - was part of presenting symptom complex.  Not sure how relates to  course of events in hospital if at all.   Christine Bal, MD [facility] [phone] pager 07/09/2012, 8:04 AM

## 2012-07-10 ENCOUNTER — Inpatient Hospital Stay (HOSPITAL_COMMUNITY): Payer: Medicare Other

## 2012-07-10 LAB — RENAL FUNCTION PANEL
CO2: 26 mEq/L (ref 19–32)
Chloride: 94 mEq/L — ABNORMAL LOW (ref 96–112)
Creatinine, Ser: 1.56 mg/dL — ABNORMAL HIGH (ref 0.50–1.10)
GFR calc non Af Amer: 29 mL/min — ABNORMAL LOW (ref >90)
Glucose, Bld: 113 mg/dL — ABNORMAL HIGH (ref 70–99)

## 2012-07-10 LAB — CULTURE, RESPIRATORY W GRAM STAIN

## 2012-07-10 LAB — CBC
HCT: 33.1 % — ABNORMAL LOW (ref 36.0–46.0)
Hemoglobin: 11.3 g/dL — ABNORMAL LOW (ref 12.0–15.0)
MCH: 32.2 pg (ref 26.0–34.0)
RBC: 3.51 MIL/uL — ABNORMAL LOW (ref 3.87–5.11)

## 2012-07-10 LAB — CULTURE, BLOOD (ROUTINE X 2)
Culture: NO GROWTH
Culture: NO GROWTH

## 2012-07-10 LAB — PHENYTOIN LEVEL, TOTAL: Phenytoin Lvl: 9.2 ug/mL — ABNORMAL LOW (ref 10.0–20.0)

## 2012-07-10 MED ORDER — HYDRALAZINE HCL 20 MG/ML IJ SOLN
10.0000 mg | INTRAMUSCULAR | Status: DC | PRN
  Administered 2012-07-11 – 2012-07-12 (×3): 20 mg via INTRAVENOUS
  Administered 2012-07-12 – 2012-07-13 (×2): 10 mg via INTRAVENOUS
  Administered 2012-07-14: 20 mg via INTRAVENOUS
  Filled 2012-07-10: qty 2
  Filled 2012-07-10 (×5): qty 1

## 2012-07-10 MED ORDER — PRO-STAT SUGAR FREE PO LIQD
30.0000 mL | Freq: Two times a day (BID) | ORAL | Status: DC
  Administered 2012-07-10 – 2012-07-14 (×10): 30 mL
  Filled 2012-07-10 (×13): qty 30

## 2012-07-10 MED ORDER — SODIUM CHLORIDE 0.9 % IV SOLN
500.0000 mg | Freq: Once | INTRAVENOUS | Status: AC
  Administered 2012-07-10: 500 mg via INTRAVENOUS
  Filled 2012-07-10: qty 5

## 2012-07-10 MED ORDER — METOPROLOL TARTRATE 25 MG/10 ML ORAL SUSPENSION
25.0000 mg | Freq: Two times a day (BID) | ORAL | Status: DC
  Administered 2012-07-10 – 2012-07-14 (×9): 25 mg
  Filled 2012-07-10 (×13): qty 10

## 2012-07-10 MED ORDER — JEVITY 1.2 CAL PO LIQD
1000.0000 mL | ORAL | Status: DC
Start: 2012-07-11 — End: 2012-07-15
  Administered 2012-07-11: 12:00:00
  Administered 2012-07-12 – 2012-07-13 (×2): 1000 mL
  Administered 2012-07-14: 35 mL
  Filled 2012-07-10 (×7): qty 1000

## 2012-07-10 MED ORDER — PANTOPRAZOLE SODIUM 40 MG PO PACK
40.0000 mg | PACK | Freq: Every day | ORAL | Status: DC
  Administered 2012-07-10 – 2012-07-18 (×9): 40 mg
  Filled 2012-07-10 (×11): qty 20

## 2012-07-10 MED ORDER — FENTANYL CITRATE 0.05 MG/ML IJ SOLN: 12.5000 ug | INTRAMUSCULAR | Status: DC | PRN

## 2012-07-10 MED ORDER — HYDRALAZINE HCL 10 MG PO TABS
10.0000 mg | ORAL_TABLET | Freq: Four times a day (QID) | ORAL | Status: DC
  Administered 2012-07-10 – 2012-07-13 (×13): 10 mg via ORAL
  Filled 2012-07-10 (×23): qty 1

## 2012-07-10 MED ORDER — METOPROLOL TARTRATE 25 MG PO TABS: 25.0000 mg | ORAL_TABLET | Freq: Two times a day (BID) | ORAL | Status: DC

## 2012-07-10 MED ORDER — PHENYTOIN SODIUM 50 MG/ML IJ SOLN
100.0000 mg | Freq: Two times a day (BID) | INTRAMUSCULAR | Status: DC
  Administered 2012-07-10 – 2012-07-15 (×12): 100 mg via INTRAVENOUS
  Filled 2012-07-10 (×14): qty 2

## 2012-07-10 NOTE — Progress Notes (Signed)
PULMONARY  / CRITICAL CARE MEDICINE  Name: Christine Graham MRN: [id number] DOB: July 18, 1925    ADMISSION DATE:  07/04/2012 CONSULTATION DATE:  07/06/12  REFERRING MD :  Dr Tat PRIMARY SERVICE: [facility]  CHIEF COMPLAINT:  Altered MS  BRIEF PATIENT DESCRIPTION:  79 yo woman, hx ESRD, HTN, hyperlipidemia. Admitted with apparent viral syndrome, leukocytosis, edema pattern on CXR. Treated w empiric abx for HCAP. Since 5/27 progressive MS change to obtundation by am 5/29. EEG with epileptiform pattern. Brain MRI 5/28 with old CVA's, nothing acute. Received ativan and dilantin, PCCM called to address impending resp failure due to MS.   SIGNIFICANT EVENTS / STUDIES:  5/28 Head CT: Age indeterminate right cerebellar infarction. This could represent an acute infarction. New high density rounded lesion right adjacent to the pons. This could represent the dolichoectasia or aneurysm of the vertebral artery. A meningioma is a secondary consideration. 5/28 Brain MRI 5/28: No acute intracranial abnormality. The right inferior cerebellar infarct is remote. Advanced atrophy and white matter disease. Remote lacunar infarcts of the basal ganglia and left cerebellum. EEG 5/31: consistent with a severe cerebral dysfunction. No definite seizures were recorded during this EEG.    LINES / TUBES: ETT 5/29 >>  [facility] CVC 5/29 >>   CULTURES: Blood 5/27 >> NEG Blood 5/29 >> NEG Sputum 5/27 >> few candida CSF bacterial 5/29 >> NEG CSF HSV 5/29 >> neg  ANTIBIOTICS: Vanco 5/27 >> 5/30 Cefepime 5/27 >> 5/30 Zosyn 5/28 x1 dose Acyclovir 5/29 >> 5/31   SUBJECTIVE/ Interval hx:  Remains minimally responsive.   VITAL SIGNS: Temp:  [98 F (36.7 C)-99.3 F (37.4 C)] 99.3 F (37.4 C) (06/02 1600) Pulse Rate:  [60-74] 61 (06/02 1500) Resp:  [12-18] 14 (06/02 1500) BP: (122-210)/(36-55) 163/39 mmHg (06/02 1500) SpO2:  [99 %-100 %] 100 % (06/02 1500) FiO2 (%):  [30 %] 30 % (06/02 1519) Weight:  [55.5 kg (122 lb 5.7  oz)-58.2 kg (128 lb 4.9 oz)] 55.5 kg (122 lb 5.7 oz) (06/02 0600) HEMODYNAMICS: CVP:  [0 mmHg-80 mmHg] 0 mmHg VENTILATOR SETTINGS: Vent Mode:  [-] PRVC FiO2 (%):  [30 %] 30 % Set Rate:  [14 bmp] 14 bmp Vt Set:  [550 mL] 550 mL PEEP:  [5 cmH20] 5 cmH20 Plateau Pressure:  [17 cmH20-21 cmH20] 21 cmH20 INTAKE / OUTPUT: Intake/Output     06/01 0701 - 06/02 0700 06/02 0701 - 06/03 0700   I.V. (mL/kg) 340 (6.1)    Other 180    NG/GT 400 160   IV Piggyback  105   Total Intake(mL/kg) 920 (16.6) 265 (4.8)   Urine (mL/kg/hr) 210 (0.2)    Other 2195 (1.6)    Total Output 2405     Net -1485 +265          PHYSICAL EXAMINATION: General:  Ill appearing elderly woman, intubated Neuro:  No response off sedation, occasional bilat foot flicker HEENT:  Ett, resps even non labored  Cardiovascular:  Regular no M Lungs:  Small volumes, few scatter B crackles Abdomen:  Softy, benign Musculoskeletal:  Trace pretibial edema Skin:  Warm, no rash  LABS:  Recent Labs Lab 07/04/12 0724  07/04/12 1645 07/04/12 2300 07/05/12 0542 07/05/12 2208  07/06/12 0932 07/06/12 1444  07/08/12 0500 07/09/12 0500 07/10/12 0219  HGB 12.3  --  13.0  --  10.3* 12.0  < >  --   --   < > 10.5* 10.1* 11.3*  WBC 13.9*  --  11.5*  --  10.3  16.7*  < >  --   --   < > 14.5* 11.6* 16.0*  PLT 284  --  242  --  262 264  < >  --   --   < > 264 229 257  NA 134*  --   --   --  138 135  < >  --   --   < > 136 134* 134*  K 3.2*  --   --   --  3.6 4.0  < >  --   --   < > 3.7 3.6 3.6  CL 93*  --   --   --  98 95*  < >  --   --   < > 99 96 94*  CO2 25  --   --   --  28 26  < >  --   --   < > 21 23 26   GLUCOSE 112*  --   --   --  151* 122*  < >  --   --   < > 138* 89 113*  BUN 37*  --   --   --  26* 33*  < >  --   --   < > 28* 14 8  CREATININE 5.39*  --  2.77*  --  3.98* 4.74*  < >  --   --   < > 4.60* 2.86* 1.56*  CALCIUM 8.6  --   --   --  8.0* 8.8  < >  --   --   < > 7.6* 7.6* 8.4  MG  --   --   --   --   --   --   --   --   2.1  --   --   --   --   PHOS  --   --   --   --  2.9  --   --   --   --   < > 4.5 3.2 2.1*  AST 21  --   --   --   --  19  --   --   --   --   --   --   --   ALT 15  --   --   --   --  13  --   --   --   --   --   --   --   ALKPHOS 96  --   --   --   --  85  --   --   --   --   --   --   --   BILITOT 0.4  --   --   --   --  0.4  --   --   --   --   --   --   --   PROT 7.9  --   --   --   --  7.8  --   --   --   --   --   --   --   ALBUMIN 3.6  --   --   --  2.7* 3.4*  --   --   --   < > 2.5* 2.5* 2.8*  TROPONINI  --   < > <0.30 <0.30 <0.30  --   --   --   --   --   --   --   --   PROBNP 45870.0*  --   --   --   --   --   --   --   --   --   --   --   --  PHART  --   --   --   --   --   --   --  7.522*  --   --   --   --   --   PCO2ART  --   --   --   --   --   --   --  34.4*  --   --   --   --   --   PO2ART  --   --   --   --   --   --   --  114.0*  --   --   --   --   --   < > = values in this interval not displayed. No results found for this basename: GLUCAP,  in the last 168 hours  CXR:  Bibasilar opacities c/w layering effusions   ASSESSMENT / PLAN:  NEUROLOGIC A:   Severe encephalopathy - probable cefepime induced  Status epilepticus - resolved P:   - Cont mgmt per Neuro  PULMONARY A: Acute Respiratory failure due to altered MS B pleural effusions - small to mod P:   - Cont vent support  CARDIOVASCULAR A: Hx HTN Shock, resolved P:  - Cont current rx - hydralazine increased 6/2  RENAL A:  ESRD on HD P:   - renal managing   GASTROINTESTINAL A:  SUP P:   - protonix - Cont TF   HEMATOLOGIC A:  No acute issues P:  - follow CBC  INFECTIOUS A:  Doubt PNA  Doubt meningitis P:   - monitor off abx    ENDOCRINE A:  No acute issues P:   - monitor CBG's    35 mins CCM time   Billy Fischer, MD ; [facility] service Mobile [phone].  After 5:30 PM or weekends, call [phone]

## 2012-07-10 NOTE — Progress Notes (Signed)
Physical Therapy Discharge Patient Details Name: Christine Graham MRN: [id number] DOB: 1925/03/26 Today's Date: 07/10/2012 Time:  -     Patient discharged from PT services secondary to medical decline - will need to re-order PT to resume therapy services.  Pt intubated and unarousable to voice off sedation.  Please see latest therapy progress note for current level of functioning and progress toward goals.    Progress and discharge plan discussed with patient and/or caregiver: Patient unable to participate in discharge planning and no caregivers available  GP     [facility] 07/10/2012, 8:50 AM

## 2012-07-10 NOTE — Progress Notes (Signed)
NUTRITION FOLLOW UP  Intervention:   Initiate Jevity 1.2 @ 20 ml/hr via OG tube and increase by 10 ml every 4 hours to goal rate of 35 ml/hr. 30 ml Prostat BID.  At goal rate, tube feeding regimen will provide 1208 kcal, 77 grams of protein, and 680 ml of H2O.   Nutrition Dx:   Inadequate oral intake related to inability to eat as evidenced by NPO status; ongoing.   Goal:   Pt to meet >/= 90% of their estimated nutrition needs; not met.   Monitor:   Vent status, TF tolerance, weight trend, labs  Assessment:   Pt discussed during ICU rounds and with RN.  Per neurology pt likely with cefepime related neurotoxicity. Unsure of ability to improve. Nephrology following, pt continues to receive HD. Potassium has been WNL and Phosphorus has been low to normal.  Patient is currently intubated on ventilator support.  MV: 7.5 Temp:Temp (24hrs), Avg:98.8 F (37.1 C), Min:98 F (36.7 C), Max:99.3 F (37.4 C)  Pt now off sedation.   Height: Ht Readings from Last 1 Encounters:  07/06/12 5' 6.93" (1.7 m)    Weight Status:   Wt Readings from Last 1 Encounters:  07/10/12 122 lb 5.7 oz (55.5 kg)  Post HD weight 123 lb 5/27  Re-estimated needs:  Kcal: 1212 Protein: 75-85 grams Fluid: > 1.2 L/day  Skin: no issues noted  Diet Order:   NPO    Intake/Output Summary (Last 24 hours) at 07/10/12 1411 Last data filed at 07/10/12 1200  Gross per 24 hour  Intake    620 ml  Output   2310 ml  Net  -1690 ml    Last BM: 5/26   Labs:   Recent Labs Lab 07/06/12 1444  07/08/12 0500 07/09/12 0500 07/10/12 0219  NA  --   < > 136 134* 134*  K  --   < > 3.7 3.6 3.6  CL  --   < > 99 96 94*  CO2  --   < > 21 23 26   BUN  --   < > 28* 14 8  CREATININE  --   < > 4.60* 2.86* 1.56*  CALCIUM  --   < > 7.6* 7.6* 8.4  MG 2.1  --   --   --   --   PHOS  --   < > 4.5 3.2 2.1*  GLUCOSE  --   < > 138* 89 113*  < > = values in this interval not displayed.  CBG (last 3)  No results found for  this basename: GLUCAP,  in the last 72 hours No results found for this basename: HGBA1C   Scheduled Meds: . amLODipine  5 mg Oral Daily  . antiseptic oral rinse  15 mL Mouth Rinse QID  . aspirin  300 mg Rectal Daily  . atorvastatin  40 mg Oral q1800  . chlorhexidine  15 mL Mouth Rinse BID  . darbepoetin (ARANESP) injection - DIALYSIS  25 mcg Intravenous Q Thu-HD  . doxercalciferol  2 mcg Intravenous Q T,Th,Sa-HD  . feeding supplement (JEVITY 1.2 CAL)  1,000 mL Per Tube Q24H  . hydrALAZINE  5 mg Intravenous Q8H  . levETIRAcetam  500 mg Intravenous Once  . levETIRAcetam  500 mg Oral Daily  . metoprolol tartrate  25 mg Per Tube BID  . multivitamin  1 tablet Oral QHS  . pantoprazole sodium  40 mg Per Tube Q1200  . phenytoin (DILANTIN) IV  100 mg Intravenous  BID  . sodium chloride  3 mL Intravenous Q12H  . sodium chloride  3 mL Intravenous Q12H    Continuous Infusions:   Kendell Bane RD, LDN, CNSC [phone] Pager [phone] After Hours Pager

## 2012-07-10 NOTE — Progress Notes (Signed)
Subjective: Not responding, on vent  Objective Filed Vitals:   07/10/12 0900 07/10/12 1000 07/10/12 1100 07/10/12 1216  BP: 174/44 157/39 152/43 182/47  Pulse: 66 62 62 65  Temp:      TempSrc:      Resp: 14 14 14    Height:      Weight:      SpO2: 100% 100% 100%     CBC:  Recent Labs Lab 07/04/12 0724  07/05/12 2208  07/08/12 0500 07/09/12 0500 07/10/12 0219  WBC 13.9*  < > 16.7*  < > 14.5* 11.6* 16.0*  NEUTROABS 10.7*  --  12.9*  --  11.2*  --   --   HGB 12.3  < > 12.0  < > 10.5* 10.1* 11.3*  HCT 35.6*  < > 35.0*  < > 30.8* 30.2* 33.1*  MCV 93.7  < > 94.9  < > 93.1 94.7 94.3  PLT 284  < > 264  < > 264 229 257  < > = values in this interval not displayed. Basic Metabolic Panel:  Recent Labs Lab 07/08/12 0500 07/09/12 0500 07/10/12 0219  NA 136 134* 134*  K 3.7 3.6 3.6  CL 99 96 94*  CO2 21 23 26   GLUCOSE 138* 89 113*  BUN 28* 14 8  CREATININE 4.60* 2.86* 1.56*  CALCIUM 7.6* 7.6* 8.4  PHOS 4.5 3.2 2.1*    Physical Exam:  Blood pressure 182/47, pulse 65, temperature 98.6 F (37 C), temperature source Oral, resp. rate 14, height 5' 6.93" (1.7 m), weight 55.5 kg (122 lb 5.7 oz), SpO2 100.00%. Ill app older woman  Intubated, line in right neck, moves legs with stimulation not purposeful  NG faintly pink drainage  Grossly clear ant  Abdomen BS present Soft  Trace pretib edema  No myoclonus  Right upper AVF with bruit/thrill  Ecchymotic blood blister right forearm  Outpatient HD:  [facility] TTS  3.75hrs  EDW 59kg  Bath 2K/2.5CA  F160   Heparin 2000  RUA AVF  Hect 2ug  EPO 2200  Assessment/Plan:  1. AMS/status epilepticus- felt possibly to be due to cefipime toxicity, extra HD done yesterday for this possibility.  On phenytoin and Keppra per neuro. Not waking up, off sedation and to have another EEG today.   2. ESRD, cont HD TTS 3. Resp / pulm infiltrates- bibasilar PNA and pulm edema on admission. CXR improved some, slightly worse today. Is off abx now, CXR looks  worse, consider resume abx with a different class? She rec'd abx for about 48 hours. 4kg down from prior dry wt today and don't believe she is vol overloaded at this time.  4. BP/volume- was on pressors, now off. Getting norvasc now 5. Anemia of ESRD- EPO had been at 2200 at center / Aranesp in hosp 25 qthurs  6. Metabolic Bone Disease- on hectorol, no binders as phos usually in range.  7. Nausea/diarrhea - was part of presenting symptom complex  Christine Moselle  MD [phone] pgr    [phone] cell 07/10/2012, 12:54 PM

## 2012-07-10 NOTE — Progress Notes (Signed)
Subjective: No acute events overnight  Exam: Filed Vitals:   07/10/12 1216  BP: 182/47  Pulse: 65  Temp:   Resp:    Gen: In bed, NAD MS: Opens eyes to voice. Does not follow commands.  UY:QIHKV, corneals intact Motor: withdraws all extremities to noxious stimuli with grimacing.  Sensory:responds to noxious stim in all 4 ext, including grimacing.   Impression: 79 yo F with sudden AMS and initial EEG concerning for NCSE. I suspect that this is cefepime related neurotoxicity and hopefully she will continue to improve now that cefepime has been removed. She did have a prolonged period of NCSE and especially given her age, there is concern that she may not recover, however I feel that it is still too early to say this with certainty, especially given that she is improving.   Cefepime related neurotoxicity has been reported with both EEGs showing discharges resembling NCSE and with coma with slow EEG not concerning for NCSE. It has been described with multifocal myoclonus and coma in a very similar pattern to this patient which leads me to this conclusion. There is also no other clear provocative factor in her case.   Her EEG did have some discharges that could be concerning for a frontal NCSE, but with clinical improvement and the previous EEG showing a similar pattern associated with state dependancy   Recommendations: 1) Continue keppra 500mg  daily, will give additional 500mg  today given EEG results.  2) Dilantin 100mg  BID.  3) dilantin level tomorrow.   Ritta Slot, MD [facility] [phone]  If 7pm- 7am, please page neurology on call at [phone].

## 2012-07-10 NOTE — Procedures (Addendum)
History: 79 yo F with AMS in the setting of recent cefepime use.   Background: The Background consists predominantly of low voltage delta and theta activities. There are periodic 2 Hz triphasic appearing waves with a pronounced frontal predominance. Theses have fluctuation in amplitude, but no clear evolution in frequency. They do attenuate completely for periods.   Photic stimulation: Physiologic driving is absent   EEG Abnormalities: 1) Periodic discharges with triphasic morphology and frontal predomiannce.   Clinical Interpretation: This abnormal EEG is most consistent with encephalopathy. This pattern is similar, but improved, compared to periods of arousal during the previous EEG. There is a small possibility that the frontal discharges could be ictal representing a non-convulsive status epilepticus, and this cannot be completely excluded on this study.   Ritta Slot, MD [facility] [phone]  If 7pm- 7am, please page neurology on call at [phone].

## 2012-07-10 NOTE — Progress Notes (Signed)
EEG completed.

## 2012-07-11 ENCOUNTER — Inpatient Hospital Stay (HOSPITAL_COMMUNITY): Payer: Medicare Other

## 2012-07-11 DIAGNOSIS — G40401 Other generalized epilepsy and epileptic syndromes, not intractable, with status epilepticus: Secondary | ICD-10-CM

## 2012-07-11 DIAGNOSIS — M6250 Muscle wasting and atrophy, not elsewhere classified, unspecified site: Secondary | ICD-10-CM

## 2012-07-11 DIAGNOSIS — J9600 Acute respiratory failure, unspecified whether with hypoxia or hypercapnia: Secondary | ICD-10-CM

## 2012-07-11 LAB — BASIC METABOLIC PANEL
Chloride: 96 mEq/L (ref 96–112)
GFR calc Af Amer: 12 mL/min — ABNORMAL LOW (ref >90)
GFR calc non Af Amer: 10 mL/min — ABNORMAL LOW (ref >90)
Potassium: 3.4 mEq/L — ABNORMAL LOW (ref 3.5–5.1)
Sodium: 135 mEq/L (ref 135–145)

## 2012-07-11 LAB — CBC
HCT: 29.8 % — ABNORMAL LOW (ref 36.0–46.0)
Hemoglobin: 9.9 g/dL — ABNORMAL LOW (ref 12.0–15.0)
RBC: 3.11 MIL/uL — ABNORMAL LOW (ref 3.87–5.11)
WBC: 14.2 10*3/uL — ABNORMAL HIGH (ref 4.0–10.5)

## 2012-07-11 LAB — PHENYTOIN LEVEL, TOTAL: Phenytoin Lvl: 5.6 ug/mL — ABNORMAL LOW (ref 10.0–20.0)

## 2012-07-11 MED ORDER — LEVETIRACETAM 100 MG/ML PO SOLN
500.0000 mg | Freq: Every day | ORAL | Status: DC
  Administered 2012-07-12 – 2012-07-14 (×3): 500 mg
  Filled 2012-07-11 (×5): qty 5

## 2012-07-11 MED ORDER — ONDANSETRON HCL 4 MG/2ML IJ SOLN
4.0000 mg | Freq: Four times a day (QID) | INTRAMUSCULAR | Status: DC | PRN
  Administered 2012-07-30: 4 mg via INTRAVENOUS
  Filled 2012-07-11: qty 2

## 2012-07-11 MED ORDER — ACETAMINOPHEN 325 MG PO TABS: 650.0000 mg | ORAL_TABLET | Freq: Four times a day (QID) | ORAL | Status: DC | PRN

## 2012-07-11 MED ORDER — FENTANYL CITRATE 0.05 MG/ML IJ SOLN
12.5000 ug | INTRAMUSCULAR | Status: DC | PRN
  Administered 2012-07-11: 25 ug via INTRAVENOUS
  Administered 2012-07-14: 12.5 ug via INTRAVENOUS
  Filled 2012-07-11 (×4): qty 2

## 2012-07-11 MED ORDER — LEVETIRACETAM 500 MG PO TABS
500.0000 mg | ORAL_TABLET | Freq: Once | ORAL | Status: AC
  Administered 2012-07-11: 500 mg via ORAL
  Filled 2012-07-11: qty 1

## 2012-07-11 MED ORDER — ONDANSETRON HCL 4 MG PO TABS: 4.0000 mg | ORAL_TABLET | Freq: Four times a day (QID) | ORAL | Status: DC | PRN

## 2012-07-11 MED ORDER — DOXERCALCIFEROL 4 MCG/2ML IV SOLN
INTRAVENOUS | Status: AC
Start: 2012-07-11 — End: 2012-07-11
  Administered 2012-07-11: 2 ug via INTRAVENOUS
  Filled 2012-07-11: qty 2

## 2012-07-11 MED ORDER — ASPIRIN 325 MG PO TABS
325.0000 mg | ORAL_TABLET | Freq: Every day | ORAL | Status: DC
  Administered 2012-07-12 – 2012-07-19 (×8): 325 mg
  Filled 2012-07-11 (×8): qty 1

## 2012-07-11 MED ORDER — AMLODIPINE BESYLATE 5 MG PO TABS
5.0000 mg | ORAL_TABLET | Freq: Every day | ORAL | Status: DC
  Administered 2012-07-12 – 2012-07-17 (×4): 5 mg
  Filled 2012-07-11 (×7): qty 1

## 2012-07-11 MED ORDER — ACETAMINOPHEN 650 MG RE SUPP: 650.0000 mg | Freq: Four times a day (QID) | RECTAL | Status: DC | PRN

## 2012-07-11 MED ORDER — ADULT MULTIVITAMIN LIQUID CH
5.0000 mL | Freq: Every day | ORAL | Status: DC
  Administered 2012-07-11 – 2012-07-18 (×8): 5 mL
  Filled 2012-07-11 (×9): qty 5

## 2012-07-11 NOTE — Progress Notes (Addendum)
Pt passed SBT and is tolerating well at this time with no complications noted. RT will monitor.  

## 2012-07-11 NOTE — Progress Notes (Signed)
PS increased at this time due to no plan to extubate the pt per MD due to poor mental status. RT will monitor.

## 2012-07-11 NOTE — Progress Notes (Signed)
Pt opens eyes slightly when her name is called

## 2012-07-11 NOTE — Progress Notes (Signed)
Subjective: More responsive today  Objective Filed Vitals:   07/11/12 1013 07/11/12 1017 07/11/12 1100 07/11/12 1145  BP: 154/46 163/40 153/40 153/40  Pulse: 70 72 67 79  Temp:  97.5 F (36.4 C)    TempSrc:  Oral    Resp: 22 22 22 22   Height:      Weight:  56.1 kg (123 lb 10.9 oz)    SpO2: 100% 100% 100% 98%    CBC:  Recent Labs Lab 07/05/12 2208  07/08/12 0500 07/09/12 0500 07/10/12 0219 07/11/12 0430  WBC 16.7*  < > 14.5* 11.6* 16.0* 14.2*  NEUTROABS 12.9*  --  11.2*  --   --   --   HGB 12.0  < > 10.5* 10.1* 11.3* 9.9*  HCT 35.0*  < > 30.8* 30.2* 33.1* 29.8*  MCV 94.9  < > 93.1 94.7 94.3 95.8  PLT 264  < > 264 229 257 261  < > = values in this interval not displayed. Basic Metabolic Panel:  Recent Labs Lab 07/08/12 0500 07/09/12 0500 07/10/12 0219 07/11/12 0430  NA 136 134* 134* 135  K 3.7 3.6 3.6 3.4*  CL 99 96 94* 96  CO2 21 23 26 27   GLUCOSE 138* 89 113* 146*  BUN 28* 14 8 37*  CREATININE 4.60* 2.86* 1.56* 3.67*  CALCIUM 7.6* 7.6* 8.4 8.3*  PHOS 4.5 3.2 2.1*  --     Physical Exam:  Blood pressure 153/40, pulse 79, temperature 97.5 F (36.4 C), temperature source Oral, resp. rate 22, height 5' 6.93" (1.7 m), weight 56.1 kg (123 lb 10.9 oz), SpO2 98.00%. Elderly female on vent  Chest: grossly clear ant  Abdomen BS present Soft  Trace pretib edema  No myoclonus  Right upper AVF with bruit/thrill  Ecchymotic blood blister right forearm  Outpatient HD:  [facility]  3.75hrs  EDW 59kg  Bath 2K/2.5CA  F160   Heparin 2000  RUA AVF  Hect 2ug  EPO 2200  Assessment/Plan:  1. AMS/status epilepticus- felt due to cefipime toxicity, on phenytoin and Keppra per neuro. MS some better today. Per neuro  2. VDRF- on vent 3. ESRD, cont HD TTS. HD today in ICU 4. HTN/volume/pulm edema-  Edema resolved, below dry wt now by 3kg. On Norvasc for BP. UF 2kg with HD today 5. Anemia of ESRD- EPO had been at 2200 at center / Aranesp in hosp 25 qthurs  6. Metabolic Bone Disease-  on hectorol, no binders as phos usually in range  Christine Moselle  MD [phone] pgr    [phone] cell 07/11/2012, 12:38 PM

## 2012-07-11 NOTE — Progress Notes (Signed)
[facility] ADULT ICU REPLACEMENT PROTOCOL FOR AM LAB REPLACEMENT ONLY  The patient does not apply for the [facility] Adult ICU Electrolyte Replacment Protocol based on the criteria listed below:    Is GFR >/= 40 ml/min? no  Patient's GFR today is 10 g/hr   Abnormal electrolyte(s):K3.4  If a panic level lab has been reported, has the CCM MD in charge been notified? yes.   Physician:  Dr Bea Laura Deterding  [place] Nakayama 07/11/2012 6:37 AM

## 2012-07-11 NOTE — Progress Notes (Signed)
PULMONARY  / CRITICAL CARE MEDICINE  Name: Christine Graham MRN: [id number] DOB: 1925/12/09    ADMISSION DATE:  07/04/2012 CONSULTATION DATE:  07/06/12  REFERRING MD :  Dr Tat PRIMARY SERVICE: [facility]  CHIEF COMPLAINT:  Altered MS  BRIEF PATIENT DESCRIPTION:  79 yo woman, hx ESRD, HTN, hyperlipidemia. Admitted with apparent viral syndrome, leukocytosis, edema pattern on CXR. Treated w empiric abx for HCAP. Since 5/27 progressive MS change to obtundation by am 5/29. EEG with epileptiform pattern. Brain MRI 5/28 with old CVA's, nothing acute. Received ativan and dilantin, PCCM called to address impending resp failure due to MS.   SIGNIFICANT EVENTS / STUDIES:  5/28 Head CT: Age indeterminate right cerebellar infarction. This could represent an acute infarction. New high density rounded lesion right adjacent to the pons. This could represent the dolichoectasia or aneurysm of the vertebral artery. A meningioma is a secondary consideration. 5/28 Brain MRI 5/28: No acute intracranial abnormality. The right inferior cerebellar infarct is remote. Advanced atrophy and white matter disease. Remote lacunar infarcts of the basal ganglia and left cerebellum. EEG 5/31: consistent with a severe cerebral dysfunction. No definite seizures were recorded during this EEG.    LINES / TUBES: ETT 5/29 >>  R [place] CVC 5/29 >>   CULTURES: Blood 5/27 >> NEG Blood 5/29 >> NEG Sputum 5/27 >> few candida CSF bacterial 5/29 >> NEG CSF HSV 5/29 >> neg  ANTIBIOTICS: Vanco 5/27 >> 5/30 Cefepime 5/27 >> 5/30 Zosyn 5/28 x1 dose Acyclovir 5/29 >> 5/31   SUBJECTIVE/ Interval hx:  Waxing and waning LOC  VITAL SIGNS: Temp:  [97.5 F (36.4 C)-99.4 F (37.4 C)] 97.5 F (36.4 C) (06/03 1017) Pulse Rate:  [61-80] 79 (06/03 1145) Resp:  [13-32] 22 (06/03 1145) BP: (64-190)/(30-57) 153/40 mmHg (06/03 1145) SpO2:  [98 %-100 %] 98 % (06/03 1145) FiO2 (%):  [30 %-31.8 %] 30.2 % (06/03 1145) Weight:  [56.1 kg (123 lb 10.9  oz)-57.8 kg (127 lb 6.8 oz)] 56.1 kg (123 lb 10.9 oz) (06/03 1017) HEMODYNAMICS: CVP:  [0 mmHg-4 mmHg] 4 mmHg VENTILATOR SETTINGS: Vent Mode:  [-] CPAP;PSV FiO2 (%):  [30 %-31.8 %] 30.2 % Set Rate:  [14 bmp] 14 bmp Vt Set:  [550 mL] 550 mL PEEP:  [5 cmH20] 5 cmH20 Pressure Support:  [5 cmH20-10 cmH20] 10 cmH20 Plateau Pressure:  [19 cmH20-21 cmH20] 19 cmH20 INTAKE / OUTPUT: Intake/Output     06/02 0701 - 06/03 0700 06/03 0701 - 06/04 0700   I.V. (mL/kg)     Other     NG/GT 625 140   IV Piggyback 105    Total Intake(mL/kg) 730 (12.6) 140 (2.5)   Urine (mL/kg/hr) 150 (0.1) 35 (0.1)   Other  1883 (5)   Stool 1 (0)    Total Output 151 1918   Net +579 -1778          PHYSICAL EXAMINATION: General: RASS -3 to -4. NAD Neuro:  Withdraws intermittently, tracks intermittently HEENT: WNL Cardiovascular:  RRR s M Lungs: clear anteriorly Abdomen: soft, NT, NABS Ext: warm, no edema   LABS: BMET    Component Value Date/Time   NA 135 07/11/2012 0430   K 3.4* 07/11/2012 0430   CL 96 07/11/2012 0430   CO2 27 07/11/2012 0430   GLUCOSE 146* 07/11/2012 0430   GLUCOSE 98 12/20/2005 1108   BUN 37* 07/11/2012 0430   CREATININE 3.67* 07/11/2012 0430   CALCIUM 8.3* 07/11/2012 0430   CALCIUM 9.0 07/10/2010 1125   GFRNONAA  10* 07/11/2012 0430   GFRAA 12* 07/11/2012 0430    CBC    Component Value Date/Time   WBC 14.2* 07/11/2012 0430   RBC 3.11* 07/11/2012 0430   HGB 9.9* 07/11/2012 0430   HCT 29.8* 07/11/2012 0430   PLT 261 07/11/2012 0430   MCV 95.8 07/11/2012 0430   MCH 31.8 07/11/2012 0430   MCHC 33.2 07/11/2012 0430   RDW 16.3* 07/11/2012 0430   LYMPHSABS 2.0 07/08/2012 0500   MONOABS 1.0 07/08/2012 0500   EOSABS 0.2 07/08/2012 0500   BASOSABS 0.0 07/08/2012 0500       CXR:  Improved B effusions   ASSESSMENT / PLAN:  NEUROLOGIC A:   Severe encephalopathy - probable cefepime induced  Status epilepticus - resolved P:   - Cont mgmt per Neuro  PULMONARY A: Acute Respiratory failure due to altered  MS B pleural effusions - small to mod P:   - Wean in PSV as tolerated -Cognition prohibits extubation   CARDIOVASCULAR A: Shock, resolved HTN  P:  - Cont current rx - Cont hydralazine  RENAL A:  ESRD on HD Hypokalemia P:   - renal managing -Correct electrolytes as indicated   GASTROINTESTINAL A:  SUP P:   - Cont protonix - Cont TF   HEMATOLOGIC A:  Chronic anemia assoc with ESRD P:  - follow CBC intermittently   INFECTIOUS A:  Doubt PNA  Doubt meningitis P:   - monitor off abx    ENDOCRINE A:  No acute issues P:   - Monitor glu on BMET    35 mins CCM time   Billy Fischer, MD ; [facility] service Mobile [phone].  After 5:30 PM or weekends, call [phone]

## 2012-07-11 NOTE — Progress Notes (Signed)
Subjective: No acute events overnight  Exam: Filed Vitals:   07/11/12 1300  BP: 130/37  Pulse: 70  Temp:   Resp: 24   Gen: In bed, NAD MS: Opens eyes to voice. Appears to wiggle toes to command, but inconsistently UX:LKGMW, corneals intact, fixates and tracks me in the room.  Motor: withdraws all extremities to noxious stimuli.   Sensory:responds to noxious stim in all 4 ext, including grimacing.   Impression: 79 yo F with sudden AMS and initial EEG concerning for NCSE. I suspect that this is cefepime related neurotoxicity and hopefully she will continue to improve now that cefepime has been removed. Her exam today shows improvement and I am hopeful that this will continue.   Cefepime related neurotoxicity has been reported with both EEGs showing discharges resembling NCSE and with coma with slow EEG not concerning for NCSE. It has been described with multifocal myoclonus and coma in a very similar pattern to this patient which leads me to this conclusion. There is also no other clear provocative factor in her case.   Recommendations: 1) Continue keppra 500mg  daily, additional dose today after dialysis.  2) Dilantin 100mg  BID.  3) dilantin level tomorrow.   Ritta Slot, MD [facility] [phone]  If 7pm- 7am, please page neurology on call at [phone].

## 2012-07-12 ENCOUNTER — Inpatient Hospital Stay (HOSPITAL_COMMUNITY): Payer: Medicare Other

## 2012-07-12 DIAGNOSIS — R29898 Other symptoms and signs involving the musculoskeletal system: Secondary | ICD-10-CM | POA: Diagnosis present

## 2012-07-12 LAB — CULTURE, BLOOD (ROUTINE X 2): Culture: NO GROWTH

## 2012-07-12 LAB — BASIC METABOLIC PANEL
CO2: 28 mEq/L (ref 19–32)
Calcium: 8.6 mg/dL (ref 8.4–10.5)
Chloride: 102 mEq/L (ref 96–112)
Potassium: 3.7 mEq/L (ref 3.5–5.1)
Sodium: 140 mEq/L (ref 135–145)

## 2012-07-12 MED ORDER — SODIUM CHLORIDE 0.9 % IV SOLN
400.0000 mg | Freq: Once | INTRAVENOUS | Status: AC
  Administered 2012-07-12: 400 mg via INTRAVENOUS
  Filled 2012-07-12: qty 8

## 2012-07-12 NOTE — Progress Notes (Signed)
NEURO HOSPITALIST PROGRESS NOTE   SUBJECTIVE:                                                                                                                        Alert and awake but no following commands. No new neurological developments. On IV keppra and IV dilantin. Dilantin level yesterday before dialysis 5.6 OBJECTIVE:                                                                                                                           Vital signs in last 24 hours: Temp:  [98.8 F (37.1 C)-99.7 F (37.6 C)] 98.9 F (37.2 C) (06/04 0735) Pulse Rate:  [62-81] 62 (06/04 1100) Resp:  [11-26] 18 (06/04 1100) BP: (130-188)/(36-59) 153/39 mmHg (06/04 1100) SpO2:  [97 %-100 %] 99 % (06/04 1100) FiO2 (%):  [30 %-31.7 %] 31.1 % (06/04 1100)  Intake/Output from previous day: 06/03 0701 - 06/04 0700 In: 1010 [NG/GT:770] Out: 1918 [Urine:35] Intake/Output this shift:   Nutritional status:    Past Medical History  Diagnosis Date  . Hyperlipidemia   . Hypertension   . Gout   . Chronic kidney disease   . Anemia       Neurologic Exam:  MS: Opens eyes spontaneously but is not following commands. BJ:YNWGN, corneals intact, fixates and tracks me in the room.  Motor: withdraws all extremities to noxious stimuli.  Sensory:responds to noxious stim in all 4 ext, including grimacing.      Lab Results: Lab Results  Component Value Date/Time   CHOL 104 07/05/2012  5:42 AM   Lipid Panel No results found for this basename: CHOL, TRIG, HDL, CHOLHDL, VLDL, LDLCALC,  in the last 72 hours  Studies/Results: Dg Chest Port 1 View  07/12/2012   *RADIOLOGY REPORT*  Clinical Data: Follow up respiratory failure  PORTABLE CHEST - 1 VIEW  Comparison: 08/07/2012  Findings: The endotracheal tube tip is stable above the carina. There is a right subclavian catheter with tip in the projection of the cavoatrial junction.  Nasogastric tube is coiled in the  stomach.  Lung volumes are decreased.  Small bilateral pleural effusions and pulmonary venous congestion persist.  IMPRESSION:  1.  Stable support apparatus. 2.  No change in small effusions and pulmonary venous congestion.   Original Report Authenticated By: Signa Kell, M.D.   Dg Chest Port 1 View  07/11/2012   *RADIOLOGY REPORT*  Clinical Data: Respiratory failure, follow-up  PORTABLE CHEST - 1 VIEW  Comparison: Portable chest x-ray of 07/10/2012  Findings: The tip of the endotracheal tube is very near the carina only 6 mm above, and withdrawing the endotracheal tube by 2 cm is recommended.  Left basilar opacity persists although aeration has improved slightly.  Also there appears to be a small left pleural effusion present.  The right central venous line tip is unchanged in position and an NG tube courses below the hemidiaphragm.  IMPRESSION:  1.  Slightly better aeration. 2.  Persistent left basilar opacity consistent with atelectasis, effusion, and possibly pneumonia. 3.  Tip of endotracheal tube very near the carina as noted above. Recommend withdrawing by 2 cm.   Original Report Authenticated By: Dwyane Dee, M.D.    MEDICATIONS                                                                                                                       I have reviewed the patient's current medications.  ASSESSMENT/PLAN:                                                                                                           Toxic encephalopathy in the context of Cefepime related neurotoxicity. She is alert and awake. Will continue keppra. Low dilantin level yesterday: will give additional 400 mg IV dilantin today. Check dilantin level in am. Will follow up.  Wyatt Portela, MD [facility] [phone]  07/12/2012, 11:19 AM

## 2012-07-12 NOTE — Progress Notes (Signed)
Subjective: Much more alert today  Objective Filed Vitals:   07/12/12 0600 07/12/12 0735 07/12/12 0740 07/12/12 0939  BP: 153/39  176/38 167/38  Pulse: 66  81 72  Temp:  98.9 F (37.2 C)    TempSrc:  Axillary    Resp: 14  11   Height:      Weight:      SpO2: 99%  100%     CBC:  Recent Labs Lab 07/05/12 2208  07/08/12 0500 07/09/12 0500 07/10/12 0219 07/11/12 0430  WBC 16.7*  < > 14.5* 11.6* 16.0* 14.2*  NEUTROABS 12.9*  --  11.2*  --   --   --   HGB 12.0  < > 10.5* 10.1* 11.3* 9.9*  HCT 35.0*  < > 30.8* 30.2* 33.1* 29.8*  MCV 94.9  < > 93.1 94.7 94.3 95.8  PLT 264  < > 264 229 257 261  < > = values in this interval not displayed. Basic Metabolic Panel:  Recent Labs Lab 07/08/12 0500 07/09/12 0500 07/10/12 0219 07/11/12 0430 07/12/12 0520  NA 136 134* 134* 135 140  K 3.7 3.6 3.6 3.4* 3.7  CL 99 96 94* 96 102  CO2 21 23 26 27 28   GLUCOSE 138* 89 113* 161* 155*  BUN 28* 14 8 37* 38*  CREATININE 4.60* 2.86* 1.56* 3.67* 3.22*  CALCIUM 7.6* 7.6* 8.4 8.3* 8.6  PHOS 4.5 3.2 2.1*  --   --     Physical Exam:  Blood pressure 167/38, pulse 72, temperature 98.9 F (37.2 C), temperature source Axillary, resp. rate 11, height 5' 6.93" (1.7 m), weight 56.1 kg (123 lb 10.9 oz), SpO2 100.00%. Elderly female on vent  Chest: grossly clear ant  Abdomen BS present Soft  Trace pretib edema  No myoclonus  Right upper AVF with bruit/thrill  Ecchymotic blood blister right forearm  Outpatient HD:  [facility]  3.75hrs  EDW 59kg  Bath 2K/2.5CA  F160   Heparin 2000  RUA AVF  Hect 2ug  EPO 2200  Assessment/Plan:  1. AMS/status epilepticus- felt due to cefipime toxicity, on phenytoin and Keppra per neuro. MS better 2. Pulm edema/effusions- resolved 3. VDRF- on vent, per CCM 4. ESRD, cont HD TTS. HD tomorrow 5. HTN/volume- BP drop 60's yest during HD, new dry wt 56-58kg 6. Anemia of ESRD- EPO had been at 2200 at center / Aranesp in hosp 25 qthurs  7. Metabolic Bone Disease- on  hectorol, no binders as phos usually in range  Christine Moselle  MD [phone] pgr    [phone] cell 07/12/2012, 10:15 AM

## 2012-07-12 NOTE — Progress Notes (Addendum)
PULMONARY  / CRITICAL CARE MEDICINE  Name: Christine Graham MRN: [id number] DOB: 1926-02-01    ADMISSION DATE:  07/04/2012 CONSULTATION DATE:  07/06/12  REFERRING MD :  Dr Tat PRIMARY SERVICE: [facility]  CHIEF COMPLAINT:  Altered MS  BRIEF PATIENT DESCRIPTION:  79 yo woman, hx ESRD, HTN, hyperlipidemia. Admitted with apparent viral syndrome, leukocytosis, edema pattern on CXR. Treated w empiric abx for HCAP. Since 5/27 progressive MS change to obtundation by am 5/29. EEG with epileptiform pattern. Brain MRI 5/28 with old CVA's, nothing acute. Received ativan and dilantin, PCCM called to address impending resp failure due to MS.   SIGNIFICANT EVENTS / STUDIES:  5/28 Head CT: Age indeterminate right cerebellar infarction. This could represent an acute infarction. New high density rounded lesion right adjacent to the pons. This could represent the dolichoectasia or aneurysm of the vertebral artery. A meningioma is a secondary consideration. 5/28 Brain MRI 5/28: No acute intracranial abnormality. The right inferior cerebellar infarct is remote. Advanced atrophy and white matter disease. Remote lacunar infarcts of the basal ganglia and left cerebellum. EEG 5/31: consistent with a severe cerebral dysfunction. No definite seizures were recorded during this EEG.    LINES / TUBES: ETT 5/29 >>  R [place] CVC 5/29 >>   CULTURES: Blood 5/27 >> NEG Blood 5/29 >> NEG Sputum 5/27 >> few candida CSF bacterial 5/29 >> NEG CSF HSV 5/29 >> neg  ANTIBIOTICS: Vanco 5/27 >> 5/30 Cefepime 5/27 >> 5/30 Zosyn 5/28 x1 dose Acyclovir 5/29 >> 5/31   SUBJECTIVE/ Interval hx:  More alert. Very weak. Marginal on PSV 10 cm H2O. Does not tolerate PSV 5 cm H2O. Perhaps trying to F/C  VITAL SIGNS: Temp:  [97.5 F (36.4 C)-99.7 F (37.6 C)] 98.9 F (37.2 C) (06/04 0735) Pulse Rate:  [65-81] 72 (06/04 0939) Resp:  [11-26] 11 (06/04 0740) BP: (130-181)/(36-59) 167/38 mmHg (06/04 0939) SpO2:  [98 %-100 %] 100 % (06/04  0740) FiO2 (%):  [30 %-31.7 %] 30 % (06/04 0740) Weight:  [56.1 kg (123 lb 10.9 oz)] 56.1 kg (123 lb 10.9 oz) (06/03 1017) HEMODYNAMICS: CVP:  [5 mmHg] 5 mmHg VENTILATOR SETTINGS: Vent Mode:  [-] CPAP;PSV FiO2 (%):  [30 %-31.7 %] 30 % Set Rate:  [14 bmp] 14 bmp Vt Set:  [550 mL] 550 mL PEEP:  [5 cmH20] 5 cmH20 Pressure Support:  [10 cmH20] 10 cmH20 Plateau Pressure:  [15 cmH20-20 cmH20] 20 cmH20 INTAKE / OUTPUT: Intake/Output     06/03 0701 - 06/04 0700 06/04 0701 - 06/05 0700   Other 240    NG/GT 770    IV Piggyback     Total Intake(mL/kg) 1010 (18)    Urine (mL/kg/hr) 35 (0)    Other 1883 (1.4)    Stool     Total Output 1918     Net -908          Urine Occurrence 1 x      PHYSICAL EXAMINATION: General: RASS 0 to -1. NAD Neuro: diffusely weak HEENT: WNL Cardiovascular:  RRR s M Lungs: clear anteriorly Abdomen: soft, NT, NABS Ext: warm, tr edema   LABS: BMET    Component Value Date/Time   NA 140 07/12/2012 0520   K 3.7 07/12/2012 0520   CL 102 07/12/2012 0520   CO2 28 07/12/2012 0520   GLUCOSE 155* 07/12/2012 0520   GLUCOSE 98 12/20/2005 1108   BUN 38* 07/12/2012 0520   CREATININE 3.22* 07/12/2012 0520   CALCIUM 8.6 07/12/2012 0520  CALCIUM 9.0 07/10/2010 1125   GFRNONAA 12* 07/12/2012 0520   GFRAA 14* 07/12/2012 0520    CBC    Component Value Date/Time   WBC 14.2* 07/11/2012 0430   RBC 3.11* 07/11/2012 0430   HGB 9.9* 07/11/2012 0430   HCT 29.8* 07/11/2012 0430   PLT 261 07/11/2012 0430   MCV 95.8 07/11/2012 0430   MCH 31.8 07/11/2012 0430   MCHC 33.2 07/11/2012 0430   RDW 16.3* 07/11/2012 0430   LYMPHSABS 2.0 07/08/2012 0500   MONOABS 1.0 07/08/2012 0500   EOSABS 0.2 07/08/2012 0500   BASOSABS 0.0 07/08/2012 0500       CXR:  NSC   ASSESSMENT / PLAN:  NEUROLOGIC A:   Severe encephalopathy - probably cefepime induced  Status epilepticus - resolved P:   - Cont mgmt per Neuro  PULMONARY A: Acute Respiratory failure due to altered MS B pleural effusions - resolved P:    - Wean in PSV as tolerated - Profound weakness will make her high risk extubation.  - Plan to discuss re-intubation status with family prior to ext   CARDIOVASCULAR A: Shock, resolved HTN  P:  - Cont current rx  RENAL A:  ESRD on HD Hypokalemia P:   - renal managing -Correct electrolytes as indicated   GASTROINTESTINAL A:  SUP P:   - Cont protonix - Cont TF   HEMATOLOGIC A:  Chronic anemia assoc with ESRD P:  - follow CBC intermittently   INFECTIOUS A:  Doubt PNA  Doubt meningitis P:   - Cont monitor off abx    ENDOCRINE A:  No acute issues P:   - Monitor glu on BMET  - Restart SSI if glu > 180  35 mins CCM time   Billy Fischer, MD ; [facility] service Mobile [phone].  After 5:30 PM or weekends, call [phone]  ADD: I spoke with pt's son in detail re advanced directives. I explained her current status and explained that there might not be much more room for improvement. I asked him to consider whether we would re-intubate his mother if she were to fail extubation and if we felt that we had gotten things as good as they can be. He informs me that she has executed a Living Will. He is to discuss further with his sister and plans to meet with rounding PCCM MD 6/05 AM   Billy Fischer, MD ; [facility] service Mobile [phone].  After 5:30 PM or weekends, call [phone]

## 2012-07-13 LAB — CBC
HCT: 27.8 % — ABNORMAL LOW (ref 36.0–46.0)
MCHC: 33.5 g/dL (ref 30.0–36.0)
MCV: 97.5 fL (ref 78.0–100.0)
RDW: 17 % — ABNORMAL HIGH (ref 11.5–15.5)

## 2012-07-13 LAB — BLOOD GAS, ARTERIAL
Bicarbonate: 29.4 mEq/L — ABNORMAL HIGH (ref 20.0–24.0)
Expiratory PAP: 5
pH, Arterial: 7.453 — ABNORMAL HIGH (ref 7.350–7.450)
pO2, Arterial: 115 mmHg — ABNORMAL HIGH (ref 80.0–100.0)

## 2012-07-13 LAB — RENAL FUNCTION PANEL
Albumin: 2.4 g/dL — ABNORMAL LOW (ref 3.5–5.2)
BUN: 61 mg/dL — ABNORMAL HIGH (ref 6–23)
Creatinine, Ser: 4.57 mg/dL — ABNORMAL HIGH (ref 0.50–1.10)
Phosphorus: 3.1 mg/dL (ref 2.3–4.6)
Potassium: 3.9 mEq/L (ref 3.5–5.1)

## 2012-07-13 LAB — PHENYTOIN LEVEL, TOTAL: Phenytoin Lvl: 6.2 ug/mL — ABNORMAL LOW (ref 10.0–20.0)

## 2012-07-13 MED ORDER — DARBEPOETIN ALFA-POLYSORBATE 25 MCG/0.42ML IJ SOLN
INTRAMUSCULAR | Status: AC
  Administered 2012-07-13: 25 ug via INTRAVENOUS
  Filled 2012-07-13: qty 0.42

## 2012-07-13 MED ORDER — HEPARIN BOLUS VIA INFUSION: 2000.0000 [IU] | Freq: Once | INTRAVENOUS | Status: DC

## 2012-07-13 MED ORDER — PENTAFLUOROPROP-TETRAFLUOROETH EX AERO: 1.0000 "application " | INHALATION_SPRAY | CUTANEOUS | Status: DC | PRN

## 2012-07-13 MED ORDER — HEPARIN SODIUM (PORCINE) 1000 UNIT/ML DIALYSIS
2000.0000 [IU] | Freq: Once | INTRAMUSCULAR | Status: AC
  Administered 2012-07-13: 2000 [IU] via INTRAVENOUS_CENTRAL
  Filled 2012-07-13: qty 2

## 2012-07-13 MED ORDER — HEPARIN SODIUM (PORCINE) 1000 UNIT/ML DIALYSIS
1000.0000 [IU] | INTRAMUSCULAR | Status: DC | PRN
  Administered 2012-07-14: 1000 [IU] via INTRAVENOUS_CENTRAL

## 2012-07-13 MED ORDER — LIDOCAINE HCL (PF) 1 % IJ SOLN: 5.0000 mL | INTRAMUSCULAR | Status: DC | PRN

## 2012-07-13 MED ORDER — LIDOCAINE-PRILOCAINE 2.5-2.5 % EX CREA: 1.0000 "application " | TOPICAL_CREAM | CUTANEOUS | Status: DC | PRN

## 2012-07-13 MED ORDER — SODIUM CHLORIDE 0.9 % IV SOLN: 100.0000 mL | INTRAVENOUS | Status: DC | PRN

## 2012-07-13 MED ORDER — DOXERCALCIFEROL 4 MCG/2ML IV SOLN
INTRAVENOUS | Status: AC
  Administered 2012-07-13: 2 ug via INTRAVENOUS
  Filled 2012-07-13: qty 2

## 2012-07-13 MED ORDER — NEPRO/CARBSTEADY PO LIQD
237.0000 mL | ORAL | Status: DC | PRN
  Filled 2012-07-13: qty 237

## 2012-07-13 MED ORDER — ALTEPLASE 2 MG IJ SOLR: 2.0000 mg | Freq: Once | INTRAMUSCULAR | Status: AC | PRN

## 2012-07-13 NOTE — Progress Notes (Signed)
PULMONARY  / CRITICAL CARE MEDICINE  Name: Christine Graham MRN: [id number] DOB: 1925/03/10    ADMISSION DATE:  07/04/2012 CONSULTATION DATE:  07/06/12  REFERRING MD :  Dr Tat PRIMARY SERVICE: [facility]  CHIEF COMPLAINT:  Altered MS  BRIEF PATIENT DESCRIPTION:  79 yo woman, hx ESRD, HTN, hyperlipidemia. Admitted with apparent viral syndrome, leukocytosis, edema pattern on CXR. Treated w empiric abx for HCAP. Since 5/27 progressive MS change to obtundation by am 5/29. EEG with epileptiform pattern. Brain MRI 5/28 with old CVA's, nothing acute. Received ativan and dilantin, PCCM called to address impending resp failure due to MS.   SIGNIFICANT EVENTS / STUDIES:  5/28 Head CT: Age indeterminate right cerebellar infarction. This could represent an acute infarction. New high density rounded lesion right adjacent to the pons. This could represent the dolichoectasia or aneurysm of the vertebral artery. A meningioma is a secondary consideration. 5/28 Brain MRI 5/28: No acute intracranial abnormality. The right inferior cerebellar infarct is remote. Advanced atrophy and white matter disease. Remote lacunar infarcts of the basal ganglia and left cerebellum. EEG 5/31: consistent with a severe cerebral dysfunction. No definite seizures were recorded during this EEG.    LINES / TUBES: ETT 5/29 >>  R  CVC 5/29 >>  Rt graft>>>  CULTURES: Blood 5/27 >> NEG Blood 5/29 >> NEG Sputum 5/27 >> few candida CSF bacterial 5/29 >> NEG CSF HSV 5/29 >> neg  ANTIBIOTICS: Vanco 5/27 >> 5/30 Cefepime 5/27 >> 5/30 Zosyn 5/28 x1 dose Acyclovir 5/29 >> 5/31   SUBJECTIVE/ Interval hx:  Failing weaning, on hd now  VITAL SIGNS: Temp:  [98.3 F (36.8 C)-99.7 F (37.6 C)] 98.5 F (36.9 C) (06/05 0732) Pulse Rate:  [58-77] 71 (06/05 0845) Resp:  [11-25] 25 (06/05 0845) BP: (126-186)/(34-105) 169/48 mmHg (06/05 0845) SpO2:  [94 %-100 %] 100 % (06/05 0845) FiO2 (%):  [30 %-40 %] 31.2 % (06/05  0845) HEMODYNAMICS:   VENTILATOR SETTINGS: Vent Mode:  [-] CPAP;PSV FiO2 (%):  [30 %-40 %] 31.2 % Set Rate:  [14 bmp] 14 bmp Vt Set:  [550 mL] 550 mL PEEP:  [5 cmH20] 5 cmH20 Pressure Support:  [10 cmH20-14 cmH20] 10 cmH20 Plateau Pressure:  [14 cmH20-21 cmH20] 14 cmH20 INTAKE / OUTPUT: Intake/Output     06/04 0701 - 06/05 0700 06/05 0701 - 06/06 0700   Other     NG/GT 665    Total Intake(mL/kg) 665 (11.9)    Urine (mL/kg/hr)     Other     Total Output       Net +665            PHYSICAL EXAMINATION: General: RASS 0 Neuro: diffusely weak, int agitation HEENT: WNL Cardiovascular:  RRR s M Lungs: clear anteriorly Abdomen: soft, NT, NABS Ext: warm, tr edema   LABS: BMET    Component Value Date/Time   NA 139 07/13/2012 0730   K 3.9 07/13/2012 0730   CL 99 07/13/2012 0730   CO2 27 07/13/2012 0730   GLUCOSE 146* 07/13/2012 0730   GLUCOSE 98 12/20/2005 1108   BUN 61* 07/13/2012 0730   CREATININE 4.57* 07/13/2012 0730   CALCIUM 8.6 07/13/2012 0730   CALCIUM 9.0 07/10/2010 1125   GFRNONAA 8* 07/13/2012 0730   GFRAA 9* 07/13/2012 0730    CBC    Component Value Date/Time   WBC 17.7* 07/13/2012 0730   RBC 2.85* 07/13/2012 0730   HGB 9.3* 07/13/2012 0730   HCT 27.8* 07/13/2012 0730   PLT  266 07/13/2012 0730   MCV 97.5 07/13/2012 0730   MCH 32.6 07/13/2012 0730   MCHC 33.5 07/13/2012 0730   RDW 17.0* 07/13/2012 0730   LYMPHSABS 2.0 07/08/2012 0500   MONOABS 1.0 07/08/2012 0500   EOSABS 0.2 07/08/2012 0500   BASOSABS 0.0 07/08/2012 0500       CXR:  Improved edema rt , ett wnl   ASSESSMENT / PLAN:  NEUROLOGIC A:   Severe encephalopathy - probably cefepime induced  Status epilepticus - resolved P:   - Cont mgmt per Neuro -dilantin being administered -frequent neurochecks  PULMONARY A: Acute Respiratory failure due to altered MS B pleural effusions - resolved P:   - Wean in PSV 10, goal to reduce if able 5 - Profound weakness will make her high risk extubation.  - Plan to discuss  re-intubation status with family prior to ext now -neg balance as goal -ensure alkalosis resolved, abg on rest -assess airway protection skills  CARDIOVASCULAR A: Shock, resolved HTN  P:  - Cont current rx -follow BP post HD  RENAL A:  ESRD on HD Hypokalemia P:   - renal managing -HD now -chem in am  GASTROINTESTINAL A:  SUP P:   - Cont protonix - Cont TF, may hold if meets mark for extubation  HEMATOLOGIC A:  Chronic anemia assoc with ESRD P:  - follow CBC intermittently, wbc in am   INFECTIOUS A:  Doubt PNA  Doubt meningitis P:   - Cont monitor off abx  -pcxr improved  ENDOCRINE A:  No acute issues P:   - Monitor glu on BMET  - Restart SSI if glu > 180  35 mins CCM time, excluding meeting time with family  Mcarthur Rossetti. Tyson Alias, MD, FACP Pgr: [phone] [facility]

## 2012-07-13 NOTE — Progress Notes (Signed)
Extensive d/w Family. We discussed her prior fxnal status and her hospital course. Concerns arouse from family about floor management with seizure noted. I offered risk to speak, they will wait on that. We discussed her progress and illness. We discussed concerns extubation success in future, consideration DNR/DNI and no trach. They want to continue current aggressive care and full code for now.  30 min ccm time  Mcarthur Rossetti. Tyson Alias, MD, FACP Pgr: [phone] [facility]

## 2012-07-13 NOTE — Progress Notes (Signed)
Subjective: Still weak, poor MS  Objective Filed Vitals:   07/13/12 0845 07/13/12 0900 07/13/12 0915 07/13/12 0930  BP: 169/48 132/37 151/41 131/43  Pulse: 71 74 77 70  Temp:      TempSrc:      Resp: 25 21 23 23   Height:      Weight:      SpO2: 100% 100% 100% 100%    CBC:  Recent Labs Lab 07/08/12 0500  07/10/12 0219 07/11/12 0430 07/13/12 0730  WBC 14.5*  < > 16.0* 14.2* 17.7*  NEUTROABS 11.2*  --   --   --   --   HGB 10.5*  < > 11.3* 9.9* 9.3*  HCT 30.8*  < > 33.1* 29.8* 27.8*  MCV 93.1  < > 94.3 95.8 97.5  PLT 264  < > 257 261 266  < > = values in this interval not displayed. Basic Metabolic Panel:  Recent Labs Lab 07/09/12 0500 07/10/12 0219 07/11/12 0430 07/12/12 0520 07/13/12 0730  NA 134* 134* 135 140 139  K 3.6 3.6 3.4* 3.7 3.9  CL 96 94* 96 102 99  CO2 23 26 27 28 27   GLUCOSE 89 113* 146* 155* 146*  BUN 14 8 37* 38* 61*  CREATININE 2.86* 1.56* 3.67* 3.22* 4.57*  CALCIUM 7.6* 8.4 8.3* 8.6 8.6  PHOS 3.2 2.1*  --   --  3.1    Physical Exam:  Blood pressure 131/43, pulse 70, temperature 98.5 F (36.9 C), temperature source Axillary, resp. rate 23, height 5' 6.93" (1.7 m), weight 56.1 kg (123 lb 10.9 oz), SpO2 100.00%. Elderly female on vent  Chest: grossly clear ant  Abdomen BS present Soft  Trace pretib edema  No myoclonus  Right upper AVF with bruit/thrill  Ecchymotic blood blister right forearm  Outpatient HD:  [facility]  3.75hrs > inc'd 4hr in hospital  EDW 59kg  Bath 2K/2.5CA  F160   Heparin 2000  RUA AVF  Hect 2ug  EPO 2200  Assessment/Plan:  1. AMS/status epilepticus- felt due to cefipime toxicity, on phenytoin and Keppra per neuro. MS still marginal. Discussed with CCM, will do extra HD tomorrow and extend Rx times to 4 hr to remove uremia as a possible cause for pt's AMS.   2. VDRF- on vent 3. ESRD, cont HD TTS. HD today, extra HD tomorrow 4. Pulm edema/volume- 3kg under dry weight, UF again today as tolerated. CXR 6/4 looked showed  improved edema 5. Anemia of ESRD- EPO had been at 2200 at center / Aranesp in hosp 25 qthurs  6. Metabolic Bone Disease- on hectorol, resume binders when taking po  Christine Moselle  MD [phone] pgr    [phone] cell 07/13/2012, 9:48 AM

## 2012-07-13 NOTE — Progress Notes (Signed)
NUTRITION FOLLOW UP  Intervention:   Continue Jevity 1.2 @ 35 ml/hr with 30 ml Prostat BID.   At goal rate, tube feeding regimen will provide 1208 kcal (100% of needs), 77 grams of protein (> 100% of needs), and 680 ml of H2O.   Nutrition Dx:   Inadequate oral intake related to inability to eat as evidenced by NPO status; ongoing.   Goal:   Pt to meet >/= 90% of their estimated nutrition needs; not met.   Monitor:   Vent status, TF tolerance, weight trend, labs  Assessment:   Pt discussed during ICU rounds and with RN.  Per neurology pt likely with cefepime related neurotoxicity. Nephrology following, pt continues to receive HD. Per nephrology pt to receive additional time on HD to help with uremia as a potential cause of altered mental status. Potassium has been low to normal and Phosphorus has been low to normal.  Patient is currently intubated on ventilator support.  MV: 6.8 Temp:Temp (24hrs), Avg:98.9 F (37.2 C), Min:98.3 F (36.8 C), Max:99.7 F (37.6 C)  Pt remains off of sedation.   Height: Ht Readings from Last 1 Encounters:  07/06/12 5' 6.93" (1.7 m)    Weight Status:   Wt Readings from Last 1 Encounters:  07/11/12 123 lb 10.9 oz (56.1 kg)  Post HD weight 123 lb 5/27  Re-estimated needs:  Kcal: 1212 Protein: 75-85 grams Fluid: > 1.2 L/day  Skin: no issues noted  Diet Order:   NPO    Intake/Output Summary (Last 24 hours) at 07/13/12 1143 Last data filed at 07/13/12 1108  Gross per 24 hour  Intake    665 ml  Output   1966 ml  Net  -1301 ml    Last BM: 6/3   Labs:   Recent Labs Lab 07/06/12 1444  07/09/12 0500 07/10/12 0219 07/11/12 0430 07/12/12 0520 07/13/12 0730  NA  --   < > 134* 134* 135 140 139  K  --   < > 3.6 3.6 3.4* 3.7 3.9  CL  --   < > 96 94* 96 102 99  CO2  --   < > 23 26 27 28 27   BUN  --   < > 14 8 37* 38* 61*  CREATININE  --   < > 2.86* 1.56* 3.67* 3.22* 4.57*  CALCIUM  --   < > 7.6* 8.4 8.3* 8.6 8.6  MG 2.1  --   --    --   --   --   --   PHOS  --   < > 3.2 2.1*  --   --  3.1  GLUCOSE  --   < > 89 113* 146* 155* 146*  < > = values in this interval not displayed.  CBG (last 3)  No results found for this basename: GLUCAP,  in the last 72 hours No results found for this basename: HGBA1C   Scheduled Meds: . amLODipine  5 mg Per Tube Daily  . antiseptic oral rinse  15 mL Mouth Rinse QID  . aspirin  325 mg Per Tube Daily  . chlorhexidine  15 mL Mouth Rinse BID  . darbepoetin (ARANESP) injection - DIALYSIS  25 mcg Intravenous Q Thu-HD  . doxercalciferol  2 mcg Intravenous Q T,Th,Sa-HD  . feeding supplement (JEVITY 1.2 CAL)  1,000 mL Per Tube Q24H  . feeding supplement  30 mL Per Tube BID  . hydrALAZINE  10 mg Oral Q6H  . levETIRAcetam  500 mg  Per Tube Daily  . metoprolol tartrate  25 mg Per Tube BID  . multivitamin  5 mL Per Tube Daily  . pantoprazole sodium  40 mg Per Tube Q1200  . phenytoin (DILANTIN) IV  100 mg Intravenous BID  . sodium chloride  3 mL Intravenous Q12H  . sodium chloride  3 mL Intravenous Q12H    Continuous Infusions:   Kendell Bane RD, LDN, CNSC [phone] Pager [phone] After Hours Pager

## 2012-07-14 ENCOUNTER — Inpatient Hospital Stay (HOSPITAL_COMMUNITY): Payer: Medicare Other

## 2012-07-14 LAB — COMPREHENSIVE METABOLIC PANEL
AST: 95 U/L — ABNORMAL HIGH (ref 0–37)
Albumin: 2.4 g/dL — ABNORMAL LOW (ref 3.5–5.2)
Alkaline Phosphatase: 101 U/L (ref 39–117)
Chloride: 94 mEq/L — ABNORMAL LOW (ref 96–112)
Potassium: 4.1 mEq/L (ref 3.5–5.1)
Total Bilirubin: 0.2 mg/dL — ABNORMAL LOW (ref 0.3–1.2)

## 2012-07-14 LAB — CBC WITH DIFFERENTIAL/PLATELET
Eosinophils Absolute: 0.3 10*3/uL (ref 0.0–0.7)
Eosinophils Relative: 2 % (ref 0–5)
Lymphs Abs: 2.1 10*3/uL (ref 0.7–4.0)
MCH: 31.6 pg (ref 26.0–34.0)
MCV: 98.4 fL (ref 78.0–100.0)
Platelets: 279 10*3/uL (ref 150–400)
RBC: 3.04 MIL/uL — ABNORMAL LOW (ref 3.87–5.11)
RDW: 16.7 % — ABNORMAL HIGH (ref 11.5–15.5)

## 2012-07-14 MED ORDER — LIDOCAINE HCL (PF) 1 % IJ SOLN: 5.0000 mL | INTRAMUSCULAR | Status: DC | PRN

## 2012-07-14 MED ORDER — LIDOCAINE-PRILOCAINE 2.5-2.5 % EX CREA: 1.0000 "application " | TOPICAL_CREAM | CUTANEOUS | Status: DC | PRN

## 2012-07-14 MED ORDER — PENTAFLUOROPROP-TETRAFLUOROETH EX AERO: 1.0000 "application " | INHALATION_SPRAY | CUTANEOUS | Status: DC | PRN

## 2012-07-14 MED ORDER — SODIUM CHLORIDE 0.9 % IV SOLN: 100.0000 mL | INTRAVENOUS | Status: DC | PRN

## 2012-07-14 MED ORDER — NEPRO/CARBSTEADY PO LIQD: 237.0000 mL | ORAL | Status: DC | PRN

## 2012-07-14 MED ORDER — HEPARIN SODIUM (PORCINE) 1000 UNIT/ML DIALYSIS: 1000.0000 [IU] | INTRAMUSCULAR | Status: DC | PRN

## 2012-07-14 MED ORDER — ALTEPLASE 2 MG IJ SOLR
2.0000 mg | Freq: Once | INTRAMUSCULAR | Status: AC | PRN
  Filled 2012-07-14: qty 2

## 2012-07-14 MED ORDER — HEPARIN SODIUM (PORCINE) 1000 UNIT/ML DIALYSIS: 2000.0000 [IU] | Freq: Once | INTRAMUSCULAR | Status: DC

## 2012-07-14 NOTE — Progress Notes (Signed)
Subjective: Still very lethargic  Objective Filed Vitals:   07/14/12 1030 07/14/12 1045 07/14/12 1100 07/14/12 1121  BP: 104/38 109/32 94/25 111/24  Pulse: 77 68 70 65  Temp:      TempSrc:      Resp: 28 24 26 25   Height:      Weight:      SpO2: 100% 100% 100% 99%    CBC:  Recent Labs Lab 07/08/12 0500  07/11/12 0430 07/13/12 0730 07/14/12 0350  WBC 14.5*  < > 14.2* 17.7* 17.7*  NEUTROABS 11.2*  --   --   --  13.8*  HGB 10.5*  < > 9.9* 9.3* 9.6*  HCT 30.8*  < > 29.8* 27.8* 29.9*  MCV 93.1  < > 95.8 97.5 98.4  PLT 264  < > 261 266 279  < > = values in this interval not displayed. Basic Metabolic Panel:  Recent Labs Lab 07/09/12 0500 07/10/12 0219  07/12/12 0520 07/13/12 0730 07/14/12 0350  NA 134* 134*  < > 140 139 135  K 3.6 3.6  < > 3.7 3.9 4.1  CL 96 94*  < > 102 99 94*  CO2 23 26  < > 28 27 30   GLUCOSE 89 113*  < > 155* 146* 132*  BUN 14 8  < > 38* 61* 45*  CREATININE 2.86* 1.56*  < > 3.22* 4.57* 3.02*  CALCIUM 7.6* 8.4  < > 8.6 8.6 8.9  PHOS 3.2 2.1*  --   --  3.1  --   < > = values in this interval not displayed.  Physical Exam:  Blood pressure 111/24, pulse 65, temperature 98.6 F (37 C), temperature source Oral, resp. rate 25, height 5' 6.93" (1.7 m), weight 56.1 kg (123 lb 10.9 oz), SpO2 99.00%. Elderly female on vent  Chest: grossly clear ant  Abdomen BS present Soft  Trace pretib edema  No myoclonus  Right upper AVF with bruit/thrill  Ecchymotic blood blister right forearm Opens eyes to voice and nods head to simple questions  Outpatient HD:  [facility]  3.75hrs EDW 59kg  Bath 2K/2.5CA  F160   Heparin 2000  RUA AVF  Hect 2ug  EPO 2200  Assessment/Plan:  1. AMS/status epilepticus- felt due to cefipime toxicity, on phenytoin and Keppra per neuro. Still extremely lethargic and somnolent.  She is on appropriately reduced doses of Keppra and Dilantin for CKD, however, I still wonder if seizure medications are contributing to her extreme lethargy- will  d/w neuro, would like to get seizure meds off if possible. Extra HD today, don't think it's going to help much though 2. VDRF- on vent due to severe debility 3. ESRD, cont HD TTS 4. Pulm edema- resolved, below dry weight, cxr improved on 6.4.  Keep even w HD 6/7 5. Anemia of ESRD- EPO had been at 2200 at center / Aranesp in hosp 25 qthurs  6. Metabolic Bone Disease- on hectorol, resume binders when taking po  Christine Moselle  MD [phone] pgr    [phone] cell 07/14/2012, 12:36 PM

## 2012-07-14 NOTE — Progress Notes (Signed)
PULMONARY  / CRITICAL CARE MEDICINE  Name: Christine Graham MRN: [id number] DOB: 04/16/1925    ADMISSION DATE:  07/04/2012 CONSULTATION DATE:  07/06/12  REFERRING MD :  Dr Tat PRIMARY SERVICE: [facility]  CHIEF COMPLAINT:  Altered MS  BRIEF PATIENT DESCRIPTION:  79 yo woman, hx ESRD, HTN, hyperlipidemia. Admitted with apparent viral syndrome, leukocytosis, edema pattern on CXR. Treated w empiric abx for HCAP. Since 5/27 progressive MS change to obtundation by am 5/29. EEG with epileptiform pattern. Brain MRI 5/28 with old CVA's, nothing acute. Received ativan and dilantin, PCCM called to address impending resp failure due to MS.   SIGNIFICANT EVENTS / STUDIES:  5/28 Head CT: Age indeterminate right cerebellar infarction. This could represent an acute infarction. New high density rounded lesion right adjacent to the pons. This could represent the dolichoectasia or aneurysm of the vertebral artery. A meningioma is a secondary consideration. 5/28 Brain MRI 5/28: No acute intracranial abnormality. The right inferior cerebellar infarct is remote. Advanced atrophy and white matter disease. Remote lacunar infarcts of the basal ganglia and left cerebellum. EEG 5/31: consistent with a severe cerebral dysfunction. No definite seizures were recorded during this EEG.   LINES / TUBES: ETT 5/29 >>  R [place] CVC 5/29 >>  Rt graft>>>  CULTURES: Blood 5/27 >> NEG Blood 5/29 >> NEG Sputum 5/27 >> few candida CSF bacterial 5/29 >> NEG CSF HSV 5/29 >> neg  ANTIBIOTICS: Vanco 5/27 >> 5/30 Cefepime 5/27 >> 5/30 Zosyn 5/28 x1 dose Acyclovir 5/29 >> 5/31  SUBJECTIVE/ Interval hx:  On HD again, had 2 liters removed 6/5  VITAL SIGNS: Temp:  [98.3 F (36.8 C)-99.7 F (37.6 C)] 98.5 F (36.9 C) (06/05 0732) Pulse Rate:  [58-77] 71 (06/05 0845) Resp:  [11-25] 25 (06/05 0845) BP: (126-186)/(34-105) 169/48 mmHg (06/05 0845) SpO2:  [94 %-100 %] 100 % (06/05 0845) FiO2 (%):  [30 %-40 %] 31.2 % (06/05  0845) HEMODYNAMICS:   VENTILATOR SETTINGS: Vent Mode:  [-] CPAP;PSV FiO2 (%):  [30 %-40 %] 31.2 % Set Rate:  [14 bmp] 14 bmp Vt Set:  [550 mL] 550 mL PEEP:  [5 cmH20] 5 cmH20 Pressure Support:  [10 cmH20-14 cmH20] 10 cmH20 Plateau Pressure:  [14 cmH20-21 cmH20] 14 cmH20 INTAKE / OUTPUT: Intake/Output     06/04 0701 - 06/05 0700 06/05 0701 - 06/06 0700   Other     NG/GT 665    Total Intake(mL/kg) 665 (11.9)    Urine (mL/kg/hr)     Other     Total Output       Net +665            PHYSICAL EXAMINATION: General: RASS 0, not consistent following commands Neuro: diffusely weak, less aggiatted HEENT: WNL Cardiovascular:  RRR s M Lungs: reduced Abdomen: soft, NT, NABS Ext: warm, tr edema to none   LABS: BMET    Component Value Date/Time   NA 139 07/13/2012 0730   K 3.9 07/13/2012 0730   CL 99 07/13/2012 0730   CO2 27 07/13/2012 0730   GLUCOSE 146* 07/13/2012 0730   GLUCOSE 98 12/20/2005 1108   BUN 61* 07/13/2012 0730   CREATININE 4.57* 07/13/2012 0730   CALCIUM 8.6 07/13/2012 0730   CALCIUM 9.0 07/10/2010 1125   GFRNONAA 8* 07/13/2012 0730   GFRAA 9* 07/13/2012 0730    CBC    Component Value Date/Time   WBC 17.7* 07/13/2012 0730   RBC 2.85* 07/13/2012 0730   HGB 9.3* 07/13/2012 0730   HCT  27.8* 07/13/2012 0730   PLT 266 07/13/2012 0730   MCV 97.5 07/13/2012 0730   MCH 32.6 07/13/2012 0730   MCHC 33.5 07/13/2012 0730   RDW 17.0* 07/13/2012 0730   LYMPHSABS 2.0 07/08/2012 0500   MONOABS 1.0 07/08/2012 0500   EOSABS 0.2 07/08/2012 0500   BASOSABS 0.0 07/08/2012 0500       CXR: rt base atx / infiltrate, ett wnl, line wnl   ASSESSMENT / PLAN:  NEUROLOGIC A:   Severe encephalopathy - probably cefepime induced  Status epilepticus - resolved P:   - Cont mgmt per Neuro -dilantin being administered -for repeat hd, appreciated, may help enceph  PULMONARY A: Acute Respiratory failure due to altered MS B pleural effusions - resolved P:   - Wean to PS 5 today, allow rate 30 if volumes 5  cc/kg, may need escalation PS - Profound weakness and neurostatus concerns remain for extubation - family thinking about wishes retube in future when planned extubatioh -neg balance as goal -lacks airway protection today  CARDIOVASCULAR A: Shock, resolved HTN  P:  - Cont current rx -follow BP post HD, she seems to drop frequent  RENAL A:  ESRD on HD Hypokalemia P:   - oon HD daily for now, likley back to a normal schedule after this dose Chem in am pe renal  GASTROINTESTINAL A:  SUP P:   - Cont protonix - Cont TF  HEMATOLOGIC A:  Chronic anemia assoc with ESRD P:  - follow CBC intermittently likely will hemoconcetrate  INFECTIOUS A:  Doubt PNA  Doubt meningitis P:   - Cont monitor off abx -limit use beta lactam with seizures from possible cefipime  ENDOCRINE A:  Glu controlled P:   - Monitor glu on BMET in am  - Restart SSI if glu > 180  30 mins CCM time  Mcarthur Rossetti. Tyson Alias, MD, FACP Pgr: [phone] [facility]

## 2012-07-14 NOTE — Progress Notes (Signed)
Chaplain stopped in to see pt while rounding in 3100. Per RN pt is intubated and thus cannot speak but can hear fine. When I spoke to pt she held out her gloved hand and I held her hand briefly. I shared words of encouragement with pt and prayed with her.

## 2012-07-14 NOTE — Progress Notes (Signed)
NEURO HOSPITALIST PROGRESS NOTE   SUBJECTIVE:                                                                                                                        She open eyes to verbal commands. Husband at bedside said " she is better, gave me high five today". No clinical seizures noted, but reported to be lethargic. Had dialysis today. On keppra 500 mg per tube daily and dilantin 100 mg BID.     OBJECTIVE:                                                                                                                           Vital signs in last 24 hours: Temp:  [98.3 F (36.8 C)-99.6 F (37.6 C)] 98.5 F (36.9 C) (06/06 1210) Pulse Rate:  [61-77] 67 (06/06 1300) Resp:  [13-32] 16 (06/06 1300) BP: (94-187)/(24-72) 139/34 mmHg (06/06 1300) SpO2:  [98 %-100 %] 100 % (06/06 1300) FiO2 (%):  [30 %-37.2 %] 31.3 % (06/06 1300)  Intake/Output from previous day: 06/05 0701 - 06/06 0700 In: 1090 [P.O.:240; NG/GT:840; IV Piggyback:10] Out: 1966  Intake/Output this shift: Total I/O In: 210 [NG/GT:210] Out: 1500 [Other:1500] Nutritional status:    Past Medical History  Diagnosis Date  . Hyperlipidemia   . Hypertension   . Gout   . Chronic kidney disease   . Anemia       Neurologic Exam:  MS: Opens eyes to verbal commands but is not following commands.  EX:BMWUX, corneals intact, fixates and tracks me in the room.  Motor: withdraws all extremities to noxious stimuli.  Sensory:responds to noxious stim in all 4 ext, including grimacing.  Lab Results: Lab Results  Component Value Date/Time   CHOL 104 07/05/2012  5:42 AM   Lipid Panel No results found for this basename: CHOL, TRIG, HDL, CHOLHDL, VLDL, LDLCALC,  in the last 72 hours  Studies/Results: Dg Chest Port 1 View  07/14/2012   *RADIOLOGY REPORT*  Clinical Data: Evaluate endotracheal tube position  PORTABLE CHEST - 1 VIEW  Comparison: Portable chest x-ray of 07/12/2012   Findings:  The tip of the endotracheal tube appears to be somewhat near the carina, only 1.6 cm above the expected carina.  There is little change in haziness at the lung bases consistent with atelectasis and effusions.  Mild cardiomegaly is stable with minimal pulmonary vascular congestion.  The right central venous line is unchanged in position with the tip overlying the lower SVC.  IMPRESSION:  1.  Tip of endotracheal tube approximately 1.6 cm above the carina. 2.  Little change in basilar atelectasis and effusions.   Original Report Authenticated By: Dwyane Dee, M.D.    MEDICATIONS                                                                                                                       I have reviewed the patient's current medications.  ASSESSMENT/PLAN:                                                                                                           Encephalopathy, probably toxic-metabolic. Agree that current anticonvulsants could be contributing to patient's lethargy. Will get EEG to ensure patient is not having ongoing electrical seizures and then stop keppra and rapidly wean off dilantin if no evidence of electrographic seizures. Will continue to follow.  Wyatt Portela, MD  [facility] [phone]  07/14/2012, 1:52 PM

## 2012-07-14 NOTE — Procedures (Signed)
I was present at this dialysis session. I have reviewed the session itself and made appropriate changes.   Vinson Moselle, MD [employer] 07/14/2012, 12:45 PM

## 2012-07-15 ENCOUNTER — Inpatient Hospital Stay (HOSPITAL_COMMUNITY): Payer: Medicare Other

## 2012-07-15 ENCOUNTER — Other Ambulatory Visit (HOSPITAL_COMMUNITY): Payer: Medicare Other

## 2012-07-15 LAB — GLUCOSE, CAPILLARY
Glucose-Capillary: 129 mg/dL — ABNORMAL HIGH (ref 70–99)
Glucose-Capillary: 191 mg/dL — ABNORMAL HIGH (ref 70–99)

## 2012-07-15 LAB — BASIC METABOLIC PANEL
Chloride: 95 mEq/L — ABNORMAL LOW (ref 96–112)
GFR calc Af Amer: 19 mL/min — ABNORMAL LOW (ref >90)
GFR calc non Af Amer: 16 mL/min — ABNORMAL LOW (ref >90)
Potassium: 3.7 mEq/L (ref 3.5–5.1)

## 2012-07-15 LAB — POCT I-STAT 3, ART BLOOD GAS (G3+)
Acid-Base Excess: 7 mmol/L — ABNORMAL HIGH (ref 0.0–2.0)
Bicarbonate: 31.3 mEq/L — ABNORMAL HIGH (ref 20.0–24.0)
O2 Saturation: 97 %
pCO2 arterial: 43 mmHg (ref 35.0–45.0)
pO2, Arterial: 81 mmHg (ref 80.0–100.0)

## 2012-07-15 MED ORDER — LORAZEPAM 2 MG/ML IJ SOLN
2.0000 mg | Freq: Once | INTRAMUSCULAR | Status: AC
  Administered 2012-07-15: 2 mg via INTRAVENOUS

## 2012-07-15 MED ORDER — LEVETIRACETAM 100 MG/ML PO SOLN
500.0000 mg | Freq: Two times a day (BID) | ORAL | Status: DC
  Administered 2012-07-15 (×2): 500 mg
  Filled 2012-07-15 (×4): qty 5

## 2012-07-15 MED ORDER — FENTANYL CITRATE 0.05 MG/ML IJ SOLN
25.0000 ug | INTRAMUSCULAR | Status: DC | PRN
  Administered 2012-07-17: 50 ug via INTRAVENOUS
  Filled 2012-07-15: qty 2

## 2012-07-15 MED ORDER — HEPARIN SODIUM (PORCINE) 1000 UNIT/ML DIALYSIS
1000.0000 [IU] | INTRAMUSCULAR | Status: DC | PRN
  Filled 2012-07-15: qty 1

## 2012-07-15 MED ORDER — VITAL AF 1.2 CAL PO LIQD: 1000.0000 mL | ORAL | Status: DC

## 2012-07-15 MED ORDER — BIOTENE DRY MOUTH MT LIQD: 15.0000 mL | Freq: Four times a day (QID) | OROMUCOSAL | Status: DC

## 2012-07-15 MED ORDER — HYDRALAZINE HCL 20 MG/ML IJ SOLN
10.0000 mg | INTRAMUSCULAR | Status: DC | PRN
  Administered 2012-07-16 – 2012-07-20 (×8): 10 mg via INTRAVENOUS
  Filled 2012-07-15 (×8): qty 1

## 2012-07-15 MED ORDER — DEXAMETHASONE SODIUM PHOSPHATE 4 MG/ML IJ SOLN
4.0000 mg | Freq: Four times a day (QID) | INTRAMUSCULAR | Status: DC
  Administered 2012-07-15 – 2012-07-17 (×8): 4 mg via INTRAVENOUS
  Filled 2012-07-15 (×12): qty 1

## 2012-07-15 MED ORDER — HEPARIN SODIUM (PORCINE) 1000 UNIT/ML DIALYSIS
2000.0000 [IU] | Freq: Once | INTRAMUSCULAR | Status: AC
  Administered 2012-07-15: 2000 [IU] via INTRAVENOUS_CENTRAL
  Filled 2012-07-15: qty 2

## 2012-07-15 MED ORDER — LORAZEPAM 2 MG/ML IJ SOLN
INTRAMUSCULAR | Status: AC
  Administered 2012-07-15: 10:00:00
  Filled 2012-07-15: qty 1

## 2012-07-15 MED ORDER — DOXERCALCIFEROL 4 MCG/2ML IV SOLN
INTRAVENOUS | Status: AC
  Administered 2012-07-15: 2 ug via INTRAVENOUS
  Filled 2012-07-15: qty 2

## 2012-07-15 MED ORDER — MIDAZOLAM HCL 2 MG/2ML IJ SOLN: 2.0000 mg | Freq: Once | INTRAMUSCULAR | Status: DC

## 2012-07-15 MED ORDER — METOPROLOL TARTRATE 1 MG/ML IV SOLN
2.5000 mg | Freq: Four times a day (QID) | INTRAVENOUS | Status: DC
  Administered 2012-07-15: 2.5 mg via INTRAVENOUS
  Filled 2012-07-15: qty 5

## 2012-07-15 MED ORDER — PRO-STAT SUGAR FREE PO LIQD
30.0000 mL | Freq: Two times a day (BID) | ORAL | Status: DC
  Administered 2012-07-15 – 2012-07-18 (×8): 30 mL
  Filled 2012-07-15 (×10): qty 30

## 2012-07-15 MED ORDER — SODIUM CHLORIDE 0.9 % IV SOLN
500.0000 mg | Freq: Two times a day (BID) | INTRAVENOUS | Status: DC
  Filled 2012-07-15 (×2): qty 5

## 2012-07-15 MED ORDER — ALTEPLASE 2 MG IJ SOLR
2.0000 mg | Freq: Once | INTRAMUSCULAR | Status: AC | PRN
  Filled 2012-07-15: qty 2

## 2012-07-15 MED ORDER — PENTAFLUOROPROP-TETRAFLUOROETH EX AERO: 1.0000 "application " | INHALATION_SPRAY | CUTANEOUS | Status: DC | PRN

## 2012-07-15 MED ORDER — LIDOCAINE HCL (PF) 1 % IJ SOLN: 5.0000 mL | INTRAMUSCULAR | Status: DC | PRN

## 2012-07-15 MED ORDER — ETOMIDATE 2 MG/ML IV SOLN
10.0000 mg | Freq: Once | INTRAVENOUS | Status: AC
  Administered 2012-07-15: 10 mg via INTRAVENOUS

## 2012-07-15 MED ORDER — JEVITY 1.2 CAL PO LIQD
1000.0000 mL | ORAL | Status: DC
  Administered 2012-07-15: 35 mL
  Administered 2012-07-16: 35 mL/h
  Administered 2012-07-17 – 2012-07-18 (×2): 1000 mL
  Filled 2012-07-15 (×7): qty 1000

## 2012-07-15 MED ORDER — FENTANYL CITRATE 0.05 MG/ML IJ SOLN
50.0000 ug | Freq: Once | INTRAMUSCULAR | Status: AC
  Administered 2012-07-15: 10:00:00 via INTRAVENOUS

## 2012-07-15 MED ORDER — CHLORHEXIDINE GLUCONATE 0.12 % MT SOLN: 15.0000 mL | Freq: Two times a day (BID) | OROMUCOSAL | Status: DC

## 2012-07-15 MED ORDER — INSULIN ASPART 100 UNIT/ML ~~LOC~~ SOLN
0.0000 [IU] | SUBCUTANEOUS | Status: DC
  Administered 2012-07-15: 2 [IU] via SUBCUTANEOUS
  Administered 2012-07-16 – 2012-07-17 (×9): 1 [IU] via SUBCUTANEOUS
  Administered 2012-07-18: 2 [IU] via SUBCUTANEOUS
  Administered 2012-07-18 (×4): 1 [IU] via SUBCUTANEOUS

## 2012-07-15 MED ORDER — NEPRO/CARBSTEADY PO LIQD
237.0000 mL | ORAL | Status: DC | PRN
  Filled 2012-07-15: qty 237

## 2012-07-15 MED ORDER — SODIUM CHLORIDE 0.9 % IV SOLN: 100.0000 mL | INTRAVENOUS | Status: DC | PRN

## 2012-07-15 MED ORDER — MIDAZOLAM HCL 2 MG/2ML IJ SOLN: 1.0000 mg | INTRAMUSCULAR | Status: DC | PRN

## 2012-07-15 MED ORDER — METOPROLOL TARTRATE 25 MG/10 ML ORAL SUSPENSION
50.0000 mg | Freq: Two times a day (BID) | ORAL | Status: DC
  Administered 2012-07-15 – 2012-07-17 (×4): 50 mg
  Filled 2012-07-15 (×8): qty 20

## 2012-07-15 MED ORDER — LIDOCAINE-PRILOCAINE 2.5-2.5 % EX CREA
1.0000 "application " | TOPICAL_CREAM | CUTANEOUS | Status: DC | PRN
  Filled 2012-07-15: qty 5

## 2012-07-15 NOTE — Significant Event (Signed)
Pt developed worsening respiratory status and decreased mentation.  Proceeded with re-intubation.  Updated family at bedside.  [name], MD [facility] 07/15/2012, 9:59 AM Pager:  [phone] After 3pm call: [phone]

## 2012-07-15 NOTE — Procedures (Signed)
I was present at this dialysis session. I have reviewed the session itself and made appropriate changes.   Vinson Moselle, MD [employer] 07/15/2012, 4:17 PM

## 2012-07-15 NOTE — Progress Notes (Signed)
PULMONARY  / CRITICAL CARE MEDICINE  Name: JAYLNN ULLERY MRN: [id number] DOB: 22-Dec-1925    ADMISSION DATE:  07/04/2012 CONSULTATION DATE:  07/06/12  REFERRING MD :  Dr Tat PRIMARY SERVICE: [facility]  CHIEF COMPLAINT:  Altered MS  BRIEF PATIENT DESCRIPTION:  79 yo woman, hx ESRD, HTN, hyperlipidemia. Admitted with apparent viral syndrome, leukocytosis, edema pattern on CXR. Treated w empiric abx for HCAP. Since 5/27 progressive MS change to obtundation by am 5/29. EEG with epileptiform pattern. Brain MRI 5/28 with old CVA's, nothing acute. Received ativan and dilantin, PCCM called to address impending resp failure due to MS.   SIGNIFICANT EVENTS / STUDIES:  5/28 Head CT: Age indeterminate right cerebellar infarction. This could represent an acute infarction. New high density rounded lesion right adjacent to the pons. This could represent the dolichoectasia or aneurysm of the vertebral artery. A meningioma is a secondary consideration. 5/28 Brain MRI 5/28: No acute intracranial abnormality. The right inferior cerebellar infarct is remote. Advanced atrophy and white matter disease. Remote lacunar infarcts of the basal ganglia and left cerebellum. EEG 5/31: consistent with a severe cerebral dysfunction. No definite seizures were recorded during this EEG.   LINES / TUBES: ETT 5/29 >>  R [place] CVC 5/29 >>  Rt graft>>>  CULTURES: Blood 5/27 >> NEG Blood 5/29 >> NEG Sputum 5/27 >> few candida CSF bacterial 5/29 >> NEG CSF HSV 5/29 >> neg  ANTIBIOTICS: Vanco 5/27 >> 5/30 Cefepime 5/27 >> 5/30 Zosyn 5/28 x1 dose Acyclovir 5/29 >> 5/31  SUBJECTIVE/ Interval hx:  For HD today.  Tolerating SBT.  VITAL SIGNS: Temp:  [98.1 F (36.7 C)-99.2 F (37.3 C)] 98.1 F (36.7 C) (06/07 0735) Pulse Rate:  [59-77] 61 (06/07 0714) Resp:  [14-32] 14 (06/07 0714) BP: (94-182)/(24-74) 168/39 mmHg (06/07 0600) SpO2:  [99 %-100 %] 100 % (06/07 0714) FiO2 (%):  [30 %-36.7 %] 30 % (06/07 0714) VENTILATOR  SETTINGS: Vent Mode:  [-] PRVC FiO2 (%):  [30 %-36.7 %] 30 % Set Rate:  [14 bmp] 14 bmp Vt Set:  [550 mL] 550 mL PEEP:  [5 cmH20] 5 cmH20 Pressure Support:  [5 cmH20] 5 cmH20 Plateau Pressure:  [14 cmH20-21 cmH20] 21 cmH20 INTAKE / OUTPUT:  Intake/Output Summary (Last 24 hours) at 07/15/12 0759 Last data filed at 07/15/12 0600  Gross per 24 hour  Intake    750 ml  Output   1500 ml  Net   -750 ml     PHYSICAL EXAMINATION: General: No distress Neuro: RASS 0, follows commands, moves all extremities HEENT: ETT, OG in place Cardiovascular: regular Lungs: no wheeze Abdomen: soft, non tender Ext: no edema   LABS: BMET Lab Results  Component Value Date   CREATININE 2.56* 07/15/2012   BUN 45* 07/15/2012   NA 137 07/15/2012   K 3.7 07/15/2012   CL 95* 07/15/2012   CO2 31 07/15/2012     CBC Lab Results  Component Value Date   WBC 17.7* 07/14/2012   HGB 9.6* 07/14/2012   HCT 29.9* 07/14/2012   MCV 98.4 07/14/2012   PLT 279 07/14/2012   Imaging: Dg Chest Port 1 View  07/15/2012   *RADIOLOGY REPORT*  Clinical Data: Assess right base atelectasis  PORTABLE CHEST - 1 VIEW  Comparison: 07/14/2012  Findings: Endotracheal tube terminates 2.5 cm above the carina.  Small bilateral pleural effusions.  Associated basilar opacities, left greater than right, likely atelectasis.  No pneumothorax.  The heart is normal in size.  Stable right subclavian  venous catheter and enteric tube.  IMPRESSION: Endotracheal tube terminates 2.5 cm above the carina.  Small bilateral pleural effusions.  Associated bibasilar opacities, likely atelectasis, left greater than right.   Original Report Authenticated By: Charline Bills, M.D.   Dg Chest Port 1 View  07/14/2012   *RADIOLOGY REPORT*  Clinical Data: Evaluate endotracheal tube position  PORTABLE CHEST - 1 VIEW  Comparison: Portable chest x-ray of 07/12/2012  Findings:  The tip of the endotracheal tube appears to be somewhat near the carina, only 1.6 cm above the expected  carina. There is little change in haziness at the lung bases consistent with atelectasis and effusions.  Mild cardiomegaly is stable with minimal pulmonary vascular congestion.  The right central venous line is unchanged in position with the tip overlying the lower SVC.  IMPRESSION:  1.  Tip of endotracheal tube approximately 1.6 cm above the carina. 2.  Little change in basilar atelectasis and effusions.   Original Report Authenticated By: Dwyane Dee, M.D.      ASSESSMENT / PLAN:  NEUROLOGIC A:   Severe encephalopathy - probably cefepime induced  >> much improved. Status epilepticus - resolved. P:   -continue keppra, dilantin per neuro  PULMONARY A: Acute Respiratory failure due to altered MS B pleural effusions - resolved P:   -pressure support wean as tolerated >> may be able to extubate soon -f/u CXR   CARDIOVASCULAR A: Shock >> resolved HTN P:  -continue ASA, hydralazine, metoprolol  RENAL A:  ESRD on HD P:   -HD per renal >> plan for HD 6/07  GASTROINTESTINAL A: Nutrition. Dysphagia. P:   -will need swallow eval after extubation -protonix for SUP  HEMATOLOGIC A: anemia of chronic disease. P:  -f/u CBC intermittently  INFECTIOUS A:  No evidence for infectious process.  Concern that cefepime contributed to seizures. P:   -Monitor off Abx  ENDOCRINE A: No issues. P:   -Monitor blood sugar on BMET  CC time 35 minutes.  [name], MD [facility] Pulmonary/Critical Care 07/15/2012, 8:07 AM Pager:  [phone] After 3pm call: [phone]

## 2012-07-15 NOTE — Procedures (Signed)
Extubation Procedure Note  Patient Details:   Name: Christine Graham DOB: 02-Feb-1926 MRN: [id number]   Airway Documentation:     Evaluation  O2 sats: stable throughout Complications: No apparent complications Patient did tolerate procedure well. Bilateral Breath Sounds: Clear Suctioning: Airway No. Pt extubated per dr. Jarrett Ables.  Placed on 3L [place].  Pt did not talk but vital signs were stable  Christine Graham V 07/15/2012, 8:31 AM

## 2012-07-15 NOTE — Procedures (Signed)
Intubation Procedure Note Christine Graham [id number] April 19, 1925  Procedure: Intubation Indications: Respiratory insufficiency  Procedure Details Consent: Risks of procedure as well as the alternatives and risks of each were explained to the (patient/caregiver).  Consent for procedure obtained. Time Out: Verified patient identification, verified procedure, site/side was marked, verified correct patient position, special equipment/implants available, medications/allergies/relevent history reviewed, required imaging and test results available.  Performed  Maximum sterile technique was used including gloves and hand hygiene.  MAC and 3  2 mg versed, 50 mcg fentanyl, 10 mg etomidate given.  #7 ETT to 22 cm at lip.  Evaluation Hemodynamic Status: BP stable throughout; O2 sats: transiently fell during during procedure Patient's Current Condition: stable Complications: No apparent complications Patient did tolerate procedure well. Chest X-ray ordered to verify placement.  CXR: pending.   Christine Helling, MD [facility] Pulmonary/Critical Care 07/15/2012, 9:50 AM Pager:  [phone] After 3pm call: [phone]

## 2012-07-15 NOTE — Significant Event (Signed)
Pt extubated earlier this AM.  Did well initially, but then developed stridor.  Will start BiPAP and decadron.  Consider addition of precedex if she develops agitation with BiPAP.  Updated family about plan.  Explained that she may need re-intubation if these interventions do not improve respiratory status.  [name], MD [facility] Pulmonary/Critical Care 07/15/2012, 9:10 AM Pager:  [phone] After 3pm call: [phone]

## 2012-07-15 NOTE — Procedures (Signed)
Intubation Procedure Note TALEYAH HILLMAN [id number] [birth year]  Procedure: Intubation Indications: Airway protection and maintenance 2 Procedure Details Consent: Risks of procedure as well as the alternatives and risks of each were explained to the (patient/caregiver).  Consent for procedure obtained. Time Out: Verified patient identification, verified procedure, site/side was marked, verified correct patient position, special equipment/implants available, medications/allergies/relevent history reviewed, required imaging and test results available.  Performed  Maximum sterile technique was used including antiseptics and hand hygiene.  MAC and 3    Evaluation Hemodynamic Status: BP stable throughout; O2 sats: transiently fell during during procedure Patient's Current Condition: stable Complications: No apparent complications Patient did tolerate procedure well. Chest X-ray ordered to verify placement.  CXR: pending. Pt reintubated and placed back on Vent of 30% 5 peep, 14rr,and 470 Vt.  Dawnmarie Breon, Rudy Jew 07/15/2012

## 2012-07-15 NOTE — Progress Notes (Signed)
Subjective: Still very lethargic  Objective Filed Vitals:   07/15/12 1515 07/15/12 1530 07/15/12 1545 07/15/12 1600  BP: 115/40 106/42 104/39 119/36  Pulse: 71 72 75 76  Temp:    96.7 F (35.9 C)  TempSrc:    Axillary  Resp: 14 13 14 13   Height:      Weight:      SpO2: 100% 100% 100% 100%    CBC:  Recent Labs Lab 07/11/12 0430 07/13/12 0730 07/14/12 0350  WBC 14.2* 17.7* 17.7*  NEUTROABS  --   --  13.8*  HGB 9.9* 9.3* 9.6*  HCT 29.8* 27.8* 29.9*  MCV 95.8 97.5 98.4  PLT 261 266 279   Basic Metabolic Panel:  Recent Labs Lab 07/09/12 0500 07/10/12 0219  07/13/12 0730 07/14/12 0350 07/15/12 0500  NA 134* 134*  < > 139 135 137  K 3.6 3.6  < > 3.9 4.1 3.7  CL 96 94*  < > 99 94* 95*  CO2 23 26  < > 27 30 31   GLUCOSE 89 113*  < > 146* 132* 131*  BUN 14 8  < > 61* 45* 45*  CREATININE 2.86* 1.56*  < > 4.57* 3.02* 2.56*  CALCIUM 7.6* 8.4  < > 8.6 8.9 9.0  PHOS 3.2 2.1*  --  3.1  --   --   < > = values in this interval not displayed.  Physical Exam:  Blood pressure 119/36, pulse 76, temperature 96.7 F (35.9 C), temperature source Axillary, resp. rate 13, height 5' 6.93" (1.7 m), weight 56.1 kg (123 lb 10.9 oz), SpO2 100.00%. Elderly female on vent  Chest: grossly clear ant  Abdomen BS present Soft  Trace pretib edema  No myoclonus  Right upper AVF with bruit/thrill  Ecchymotic blood blister right forearm Opens eyes to voice and nods head to simple questions  Outpatient HD:  [facility]  3.75hrs EDW 59kg  Bath 2K/2.5CA  F160   Heparin 2000  RUA AVF  Hect 2ug  EPO 2200  Assessment/Plan:  1. AMS/status epilepticus- felt due to cefipime toxicity, on phenytoin and Keppra per neuro. Still extremely lethargic and somnolent >> would like to get seizure meds off if possible. Neuro ordered EEG in this regard then planning quick taper of meds if EEG negative 2. VDRF- on vent due to severe debility. Failed attempt to extubate today 3. ESRD, cont HD TTS. HD today 4. Pulm edema-  resolved 5. HTN/volume- euvolemic. Was on norvasc and atenolol at home, getting norvasc and metoprolol here.  6. Anemia of ESRD- EPO had been at 2200 at center / Aranesp in hosp 25 qthurs  7. Metabolic Bone Disease- on hectorol, resume binders when taking po  Christine Moselle  MD [phone] pgr    [phone] cell 07/15/2012, 4:19 PM

## 2012-07-15 NOTE — Progress Notes (Signed)
eLink Physician-Brief Progress Note Patient Name: Christine Graham DOB: [birth year] MRN: [id number]  Date of Service  07/15/2012   HPI/Events of Note   hyperglycemia  eICU Interventions  Add sensitive scale ssi      MCQUAID, DOUGLAS 07/15/2012, 11:49 PM

## 2012-07-16 ENCOUNTER — Other Ambulatory Visit (HOSPITAL_COMMUNITY): Payer: Medicare Other

## 2012-07-16 ENCOUNTER — Inpatient Hospital Stay (HOSPITAL_COMMUNITY): Payer: Medicare Other

## 2012-07-16 DIAGNOSIS — R4182 Altered mental status, unspecified: Secondary | ICD-10-CM

## 2012-07-16 LAB — GLUCOSE, CAPILLARY
Glucose-Capillary: 130 mg/dL — ABNORMAL HIGH (ref 70–99)
Glucose-Capillary: 127 mg/dL — ABNORMAL HIGH (ref 70–99)

## 2012-07-16 LAB — RENAL FUNCTION PANEL
Albumin: 2.4 g/dL — ABNORMAL LOW (ref 3.5–5.2)
Albumin: 2.4 g/dL — ABNORMAL LOW (ref 3.5–5.2)
BUN: 41 mg/dL — ABNORMAL HIGH (ref 6–23)
BUN: 49 mg/dL — ABNORMAL HIGH (ref 6–23)
CO2: 30 mEq/L (ref 19–32)
Chloride: 96 mEq/L (ref 96–112)
Chloride: 96 mEq/L (ref 96–112)
Creatinine, Ser: 2.28 mg/dL — ABNORMAL HIGH (ref 0.50–1.10)
Creatinine, Ser: 2.71 mg/dL — ABNORMAL HIGH (ref 0.50–1.10)
GFR calc non Af Amer: 18 mL/min — ABNORMAL LOW (ref >90)
Phosphorus: 3.2 mg/dL (ref 2.3–4.6)
Potassium: 4.5 mEq/L (ref 3.5–5.1)

## 2012-07-16 LAB — CBC
Hemoglobin: 9.4 g/dL — ABNORMAL LOW (ref 12.0–15.0)
MCH: 32 pg (ref 26.0–34.0)
Platelets: 349 10*3/uL (ref 150–400)
RBC: 2.94 MIL/uL — ABNORMAL LOW (ref 3.87–5.11)
WBC: 16.6 10*3/uL — ABNORMAL HIGH (ref 4.0–10.5)

## 2012-07-16 MED ORDER — PHENYTOIN 125 MG/5ML PO SUSP
100.0000 mg | Freq: Two times a day (BID) | ORAL | Status: DC
  Administered 2012-07-16 – 2012-07-17 (×3): 100 mg
  Filled 2012-07-16 (×4): qty 4

## 2012-07-16 NOTE — Progress Notes (Signed)
Subjective: Looks more alert today  Objective Filed Vitals:   07/16/12 0600 07/16/12 0700 07/16/12 0713 07/16/12 0800  BP: 141/43 144/42 144/42 174/44  Pulse: 64 61 62 64  Temp:    99 F (37.2 C)  TempSrc:    Oral  Resp: 12 12 12 12   Height:      Weight:      SpO2: 100% 99% 99% 99%    CBC:  Recent Labs Lab 07/13/12 0730 07/14/12 0350 07/16/12 0500  WBC 17.7* 17.7* 16.6*  NEUTROABS  --  13.8*  --   HGB 9.3* 9.6* 9.4*  HCT 27.8* 29.9* 29.4*  MCV 97.5 98.4 100.0  PLT 266 279 349   Basic Metabolic Panel:  Recent Labs Lab 07/10/12 0219  07/13/12 0730 07/14/12 0350 07/15/12 0500 07/16/12 0440  NA 134*  < > 139 135 137 137  K 3.6  < > 3.9 4.1 3.7 4.5  CL 94*  < > 99 94* 95* 96  CO2 26  < > 27 30 31 30   GLUCOSE 113*  < > 146* 132* 131* 155*  BUN 8  < > 61* 45* 45* 41*  CREATININE 1.56*  < > 4.57* 3.02* 2.56* 2.28*  CALCIUM 8.4  < > 8.6 8.9 9.0 8.5  PHOS 2.1*  --  3.1  --   --  2.9  < > = values in this interval not displayed.  Physical Exam:  Blood pressure 174/44, pulse 64, temperature 99 F (37.2 C), temperature source Oral, resp. rate 12, height 5' 6.93" (1.7 m), weight 55.2 kg (121 lb 11.1 oz), SpO2 99.00%. Elderly female on vent, much more alert today, wide awake Chest: grossly clear ant  Abdomen BS present Soft  Trace pretib edema  No myoclonus  Right upper AVF with bruit/thrill  Ecchymotic blood blister right forearm  Outpatient HD:  [facility]  3.75hrs EDW 59kg  Bath 2K/2.5CA  F160   Heparin 2000  RUA AVF  Hect 2ug  EPO 2200  Assessment/Plan:  1. AMS/status epilepticus- felt due to cefipime toxicity. Looks much more alert today, not sure if this is due to the extra HD for uremia, or extra dialysis removing the Keppra which is very sedating if in excess. Have d/w neuro, will d/c Keppra today as no gross evidence of active seizures now. Hopefully dilantin can be stopped soon if she remains stable. Ordered free dilantin level. 2. VDRF- on vent due to severe  debility. Failed attempt to extubate 6/7 3. ESRD, cont HD TTS 4. Pulm edema- resolved 5. HTN/volume- euvolemic, 3-4kg below dry wt. Was on norvasc and atenolol at home, getting norvasc and metoprolol here.  6. Anemia of ESRD- EPO had been at 2200 at center / Aranesp in hosp 25 qthurs. Hb 9-10 range 7. Metabolic Bone Disease- on hectorol, resume binders when taking po  Vinson Moselle  MD [phone] pgr    [phone] cell 07/16/2012, 9:20 AM

## 2012-07-16 NOTE — Progress Notes (Signed)
NEURO HOSPITALIST PROGRESS NOTE   SUBJECTIVE:                                                                                                                        More alert and trying to follow commands. No new neurological developments. On low dose keppra and dilantin.   OBJECTIVE:                                                                                                                           Vital signs in last 24 hours: Temp:  [96.5 F (35.8 C)-99 F (37.2 C)] 99 F (37.2 C) (06/08 0800) Pulse Rate:  [59-83] 66 (06/08 1007) Resp:  [12-18] 12 (06/08 0800) BP: (94-174)/(29-111) 171/48 mmHg (06/08 1010) SpO2:  [99 %-100 %] 99 % (06/08 0800) FiO2 (%):  [30 %-41.9 %] 30 % (06/08 0800) Weight:  [55.2 kg (121 lb 11.1 oz)] 55.2 kg (121 lb 11.1 oz) (06/08 0400)  Intake/Output from previous day: 06/07 0701 - 06/08 0700 In: 765 [I.V.:25; NG/GT:740] Out: 0  Intake/Output this shift:   Nutritional status:    Past Medical History  Diagnosis Date  . Hyperlipidemia   . Hypertension   . Gout   . Chronic kidney disease   . Anemia       Neurologic Exam:  MS: alert and awake, follows some simple commands.  ZH:YQMVH, corneals intact, fixates and tracks me in the room.   Motor: withdraws all extremities to noxious stimuli.  Sensory: no tested. DTR's: 1+ all over Coordination and gait: no tested. Plantars: no tested.  Lab Results: Lab Results  Component Value Date/Time   CHOL 104 07/05/2012  5:42 AM   Lipid Panel No results found for this basename: CHOL, TRIG, HDL, CHOLHDL, VLDL, LDLCALC,  in the last 72 hours  Studies/Results: Dg Chest Port 1 View  07/16/2012   *RADIOLOGY REPORT*  Clinical Data: Follow up atelectasis  PORTABLE CHEST - 1 VIEW  Comparison: Prior chest x-ray 07/15/2012  Findings: The tip of the endotracheal tube is 1 cm above the carina. A nasogastric tube is in place.  The tip lies below the diaphragm off the  field of view, presumably within the stomach. Right subclavian approach central venous catheter  in stable position with the tip in the mid SVC.  Stable appearance of the chest with persistent low inspiratory volumes and left greater than right retrocardiac opacities which could reflect atelectasis versus consolidation.  A prominent skin fold in the right upper lung gives a pseudo pneumothorax appearance.  No acute osseous abnormality.  IMPRESSION:  1.  A nasogastric tube is in place.  The tip lies below the diaphragm off the field of view, presumably within the stomach.  2. The tip of the endotracheal tube is low at 1 cm above the carina.    Consider retracting 2 cm for more optimal placement.  3.  Stable appearance of the lungs with left greater than right bibasilar retrocardiac opacities which could reflect atelectasis or infiltrate.   Original Report Authenticated By: Malachy Moan, M.D.   Dg Chest Port 1 View  07/15/2012   *RADIOLOGY REPORT*  Clinical Data: Evaluate endotracheal tube placement.  PORTABLE CHEST - 1 VIEW  Comparison: Chest x-ray 07/15/2012.  Findings: An endotracheal tube is in place with tip 3.1 cm above the carina. There is a right-sided subclavian central venous catheter with tip terminating in the mid superior vena cava. Lung volumes are low.  Opacity in the medial aspect of the left bases similar to the prior study and may reflect some atelectasis and/or consolidation.  No pleural effusions.  No evidence of pulmonary edema.  Heart size is normal.  Mediastinal contours are unremarkable.  Atherosclerosis of the thoracic aorta.  IMPRESSION: 1.  Support apparatus, as above. 2.  Low lung volumes with atelectasis and/or consolidation in the medial aspect of left lower lobe. 3.  Atherosclerosis.   Original Report Authenticated By: Trudie Reed, M.D.   Dg Chest Port 1 View  07/15/2012   *RADIOLOGY REPORT*  Clinical Data: Assess right base atelectasis  PORTABLE CHEST - 1 VIEW  Comparison:  07/14/2012  Findings: Endotracheal tube terminates 2.5 cm above the carina.  Small bilateral pleural effusions.  Associated basilar opacities, left greater than right, likely atelectasis.  No pneumothorax.  The heart is normal in size.  Stable right subclavian venous catheter and enteric tube.  IMPRESSION: Endotracheal tube terminates 2.5 cm above the carina.  Small bilateral pleural effusions.  Associated bibasilar opacities, likely atelectasis, left greater than right.   Original Report Authenticated By: Charline Bills, M.D.   Dg Abd Portable 1v  07/15/2012   *RADIOLOGY REPORT*  Clinical Data: NG tube placement.  PORTABLE ABDOMEN - 1 VIEW  Comparison: Chest x-ray 07/15/2012  Findings: Orogastric tube is in place, tip overlying the level of the duodenum, likely within the region of the bulb.  Bowel gas pattern is nonobstructive.  There are significant degenerative changes in the spine.  The patient has had previous right hip arthroplasty.  IMPRESSION: Orogastric tube tip overlying the level of the proximal duodenum.   Original Report Authenticated By: Norva Pavlov, M.D.    MEDICATIONS  I have reviewed the patient's current medications.  ASSESSMENT/PLAN:                                                                                                           Toxic-metabolic encephalopathy, significantly improved. NCSE resolved. Agree with stopping keppra today. Start weaning off dilantin 6/9/ if no new neurological issues develop.  Will follow up.  Wyatt Portela ,MD [facility] [phone]  07/16/2012, 10:29 AM

## 2012-07-16 NOTE — Progress Notes (Signed)
PULMONARY  / CRITICAL CARE MEDICINE  Name: Christine Graham MRN: [id number] DOB: [birth year]    ADMISSION DATE:  07/04/2012 CONSULTATION DATE:  07/06/12  REFERRING MD :  Dr Tat PRIMARY SERVICE: [facility]  CHIEF COMPLAINT:  Altered MS  BRIEF PATIENT DESCRIPTION:  79 yo woman, hx ESRD, HTN, hyperlipidemia. Admitted with apparent viral syndrome, leukocytosis, edema pattern on CXR. Treated w empiric abx for HCAP. Since 5/27 progressive MS change to obtundation by am 5/29. EEG with epileptiform pattern. Brain MRI 5/28 with old CVA's, nothing acute. Received ativan and dilantin, PCCM called to address impending resp failure due to MS.   SIGNIFICANT EVENTS: 6/07 Extubated >> re-intubated for stridor, started decadron  STUDIES:  5/28 Head CT: Age indeterminate right cerebellar infarction. This could represent an acute infarction. New high density rounded lesion right adjacent to the pons. This could represent the dolichoectasia or aneurysm of the vertebral artery. A meningioma is a secondary consideration. 5/28 Brain MRI 5/28: No acute intracranial abnormality. The right inferior cerebellar infarct is remote. Advanced atrophy and white matter disease. Remote lacunar infarcts of the basal ganglia and left cerebellum. EEG 5/31: consistent with a severe cerebral dysfunction. No definite seizures were recorded during this EEG.   LINES / TUBES: ETT 5/29 >> 6/07 R [place] CVC 5/29 >>  Rt graft>>> ETT 6/07 >>  CULTURES: Blood 5/27 >> NEG Blood 5/29 >> NEG Sputum 5/27 >> few candida CSF bacterial 5/29 >> NEG CSF HSV 5/29 >> neg  ANTIBIOTICS: Vanco 5/27 >> 5/30 Cefepime 5/27 >> 5/30 Zosyn 5/28 x1 dose Acyclovir 5/29 >> 5/31  SUBJECTIVE: Tolerating pressure support.  VITAL SIGNS: Temp:  [96.5 F (35.8 C)-99 F (37.2 C)] 99 F (37.2 C) (06/08 0800) Pulse Rate:  [59-83] 64 (06/08 0800) Resp:  [12-18] 12 (06/08 0800) BP: (68-174)/(29-111) 174/44 mmHg (06/08 0800) SpO2:  [95 %-100 %] 99 % (06/08  0800) FiO2 (%):  [30 %-41.9 %] 30 % (06/08 0800) Weight:  [121 lb 11.1 oz (55.2 kg)] 121 lb 11.1 oz (55.2 kg) (06/08 0400) VENTILATOR SETTINGS: Vent Mode:  [-] PRVC FiO2 (%):  [30 %-41.9 %] 30 % Set Rate:  [12 bmp-14 bmp] 12 bmp Vt Set:  [470 mL] 470 mL PEEP:  [4.9 cmH20-5 cmH20] 5 cmH20 Plateau Pressure:  [13 cmH20-18 cmH20] 17 cmH20 INTAKE / OUTPUT:  Intake/Output Summary (Last 24 hours) at 07/16/12 4098 Last data filed at 07/16/12 0700  Gross per 24 hour  Intake    765 ml  Output      0 ml  Net    765 ml     PHYSICAL EXAMINATION: General: No distress Neuro: RASS 0, moves all extremities HEENT: ETT, OG in place Cardiovascular: regular Lungs: no wheeze Abdomen: soft, non tender Ext: no edema   LABS: BMET Lab Results  Component Value Date   CREATININE 2.28* 07/16/2012   BUN 41* 07/16/2012   NA 137 07/16/2012   K 4.5 07/16/2012   CL 96 07/16/2012   CO2 30 07/16/2012     CBC Lab Results  Component Value Date   WBC 16.6* 07/16/2012   HGB 9.4* 07/16/2012   HCT 29.4* 07/16/2012   MCV 100.0 07/16/2012   PLT 349 07/16/2012   Imaging: Dg Chest Port 1 View  07/16/2012   *RADIOLOGY REPORT*  Clinical Data: Follow up atelectasis  PORTABLE CHEST - 1 VIEW  Comparison: Prior chest x-ray 07/15/2012  Findings: The tip of the endotracheal tube is 1 cm above the carina. A nasogastric tube is in  place.  The tip lies below the diaphragm off the field of view, presumably within the stomach. Right subclavian approach central venous catheter in stable position with the tip in the mid SVC.  Stable appearance of the chest with persistent low inspiratory volumes and left greater than right retrocardiac opacities which could reflect atelectasis versus consolidation.  A prominent skin fold in the right upper lung gives a pseudo pneumothorax appearance.  No acute osseous abnormality.  IMPRESSION:  1.  A nasogastric tube is in place.  The tip lies below the diaphragm off the field of view, presumably within the  stomach.  2. The tip of the endotracheal tube is low at 1 cm above the carina.    Consider retracting 2 cm for more optimal placement.  3.  Stable appearance of the lungs with left greater than right bibasilar retrocardiac opacities which could reflect atelectasis or infiltrate.   Original Report Authenticated By: Malachy Moan, M.D.   Dg Chest Port 1 View  07/15/2012   *RADIOLOGY REPORT*  Clinical Data: Evaluate endotracheal tube placement.  PORTABLE CHEST - 1 VIEW  Comparison: Chest x-ray 07/15/2012.  Findings: An endotracheal tube is in place with tip 3.1 cm above the carina. There is a right-sided subclavian central venous catheter with tip terminating in the mid superior vena cava. Lung volumes are low.  Opacity in the medial aspect of the left bases similar to the prior study and may reflect some atelectasis and/or consolidation.  No pleural effusions.  No evidence of pulmonary edema.  Heart size is normal.  Mediastinal contours are unremarkable.  Atherosclerosis of the thoracic aorta.  IMPRESSION: 1.  Support apparatus, as above. 2.  Low lung volumes with atelectasis and/or consolidation in the medial aspect of left lower lobe. 3.  Atherosclerosis.   Original Report Authenticated By: Trudie Reed, M.D.   Dg Chest Port 1 View  07/15/2012   *RADIOLOGY REPORT*  Clinical Data: Assess right base atelectasis  PORTABLE CHEST - 1 VIEW  Comparison: 07/14/2012  Findings: Endotracheal tube terminates 2.5 cm above the carina.  Small bilateral pleural effusions.  Associated basilar opacities, left greater than right, likely atelectasis.  No pneumothorax.  The heart is normal in size.  Stable right subclavian venous catheter and enteric tube.  IMPRESSION: Endotracheal tube terminates 2.5 cm above the carina.  Small bilateral pleural effusions.  Associated bibasilar opacities, likely atelectasis, left greater than right.   Original Report Authenticated By: Charline Bills, M.D.   Dg Abd Portable 1v  07/15/2012    *RADIOLOGY REPORT*  Clinical Data: NG tube placement.  PORTABLE ABDOMEN - 1 VIEW  Comparison: Chest x-ray 07/15/2012  Findings: Orogastric tube is in place, tip overlying the level of the duodenum, likely within the region of the bulb.  Bowel gas pattern is nonobstructive.  There are significant degenerative changes in the spine.  The patient has had previous right hip arthroplasty.  IMPRESSION: Orogastric tube tip overlying the level of the proximal duodenum.   Original Report Authenticated By: Norva Pavlov, M.D.      ASSESSMENT / PLAN:  NEUROLOGIC A:   Severe encephalopathy - probably cefepime induced  >> much improved. Status epilepticus - resolved. P:   -continue keppra, dilantin per neuro  PULMONARY A: Acute Respiratory failure due to altered MS B pleural effusions - resolved Post-extubation stridor developed 6/07 P:   -pressure support wean as tolerated >> defer extubation for now -continue decadron -f/u CXR   CARDIOVASCULAR A: Shock >> resolved HTN P:  -continue  ASA, hydralazine, metoprolol  RENAL A:  ESRD on HD P:   -HD per renal   GASTROINTESTINAL A: Nutrition. Dysphagia. P:   -tube feeds while on vent -protonix for SUP  HEMATOLOGIC A: anemia of chronic disease. P:  -f/u CBC intermittently  INFECTIOUS A:  No evidence for infectious process.  Concern that cefepime contributed to seizures. P:   -Monitor off Abx  ENDOCRINE A: No issues. P:   -Monitor blood sugar on BMET  Updated family about plan.  CC time 35 minutes.  [name], MD [facility] Pulmonary/Critical Care 07/16/2012, 9:09 AM Pager:  [phone] After 3pm call: [phone]

## 2012-07-17 ENCOUNTER — Inpatient Hospital Stay (HOSPITAL_COMMUNITY): Payer: Medicare Other

## 2012-07-17 ENCOUNTER — Other Ambulatory Visit (HOSPITAL_COMMUNITY): Payer: Medicare Other

## 2012-07-17 DIAGNOSIS — J9589 Other postprocedural complications and disorders of respiratory system, not elsewhere classified: Secondary | ICD-10-CM

## 2012-07-17 LAB — CBC
Hemoglobin: 9.5 g/dL — ABNORMAL LOW (ref 12.0–15.0)
MCH: 31.7 pg (ref 26.0–34.0)
MCHC: 32.1 g/dL (ref 30.0–36.0)
MCV: 98.7 fL (ref 78.0–100.0)
Platelets: 382 10*3/uL (ref 150–400)
RBC: 3 MIL/uL — ABNORMAL LOW (ref 3.87–5.11)

## 2012-07-17 LAB — IRON AND TIBC
Iron: 74 ug/dL (ref 42–135)
TIBC: 136 ug/dL — ABNORMAL LOW (ref 250–470)

## 2012-07-17 LAB — GLUCOSE, CAPILLARY
Glucose-Capillary: 124 mg/dL — ABNORMAL HIGH (ref 70–99)
Glucose-Capillary: 120 mg/dL — ABNORMAL HIGH (ref 70–99)
Glucose-Capillary: 138 mg/dL — ABNORMAL HIGH (ref 70–99)

## 2012-07-17 LAB — RENAL FUNCTION PANEL
Albumin: 2.5 g/dL — ABNORMAL LOW (ref 3.5–5.2)
BUN: 91 mg/dL — ABNORMAL HIGH (ref 6–23)
Chloride: 92 mEq/L — ABNORMAL LOW (ref 96–112)
GFR calc non Af Amer: 9 mL/min — ABNORMAL LOW (ref >90)
Phosphorus: 4.4 mg/dL (ref 2.3–4.6)
Potassium: 4.8 mEq/L (ref 3.5–5.1)

## 2012-07-17 LAB — FERRITIN: Ferritin: 2485 ng/mL — ABNORMAL HIGH (ref 10–291)

## 2012-07-17 MED ORDER — PHENYTOIN 125 MG/5ML PO SUSP
100.0000 mg | Freq: Two times a day (BID) | ORAL | Status: AC
  Administered 2012-07-17 – 2012-07-18 (×3): 100 mg
  Filled 2012-07-17 (×3): qty 4

## 2012-07-17 MED ORDER — DEXAMETHASONE SODIUM PHOSPHATE 4 MG/ML IJ SOLN
4.0000 mg | Freq: Three times a day (TID) | INTRAMUSCULAR | Status: DC
  Administered 2012-07-17 – 2012-07-20 (×9): 4 mg via INTRAVENOUS
  Filled 2012-07-17 (×11): qty 1

## 2012-07-17 NOTE — Progress Notes (Signed)
NUTRITION FOLLOW UP  Intervention:   Continue Jevity 1.2 @ 35 ml/hr with 30 ml Prostat BID.   At goal rate, tube feeding regimen will provide 1208 kcal (100% of needs), 77 grams of protein (> 100% of needs), and 680 ml of H2O.   Nutrition Dx:   Inadequate oral intake related to inability to eat as evidenced by NPO status; ongoing.   Goal:   Pt to meet >/= 90% of their estimated nutrition needs; not met.   Monitor:   Vent status, TF tolerance, weight trend, labs  Assessment:   Pt discussed during ICU rounds and with RN.  Per neurology pt likely with cefepime related neurotoxicity. Nephrology following, pt continues to receive HD.  Per RN pt extubated and re-intubated on Saturday. Pt awake and alert on the vent, significant other at bedside.  Potassium has been low to normal and Phosphorus has been low to normal.  Patient is currently intubated on ventilator support.  MV: 7.3 Temp:Temp (24hrs), Avg:98.2 F (36.8 C), Min:97.3 F (36.3 C), Max:98.9 F (37.2 C)  Pt remains off of sedation.   Height: Ht Readings from Last 1 Encounters:  07/06/12 5' 6.93" (1.7 m)    Weight Status:   Wt Readings from Last 1 Encounters:  07/17/12 121 lb 11.1 oz (55.2 kg)  Post HD weight 123 lb 5/27  Re-estimated needs:  Kcal: 1212 Protein: 75-85 grams Fluid: > 1.2 L/day  Skin: no issues noted  Diet Order:   NPO    Intake/Output Summary (Last 24 hours) at 07/17/12 1230 Last data filed at 07/17/12 1200  Gross per 24 hour  Intake   1073 ml  Output      0 ml  Net   1073 ml    Last BM: 6/3   Labs:   Recent Labs Lab 07/16/12 0440 07/16/12 1005 07/17/12 1015  NA 137 136 135  K 4.5 4.4 4.8  CL 96 96 92*  CO2 30 30 27   BUN 41* 49* 91*  CREATININE 2.28* 2.71* 4.27*  CALCIUM 8.5 8.8 8.6  PHOS 2.9 3.2 4.4  GLUCOSE 155* 138* 148*    CBG (last 3)   Recent Labs  07/17/12 0414 07/17/12 0752 07/17/12 1129  GLUCAP 133* 124* 123*   No results found for this basename:  HGBA1C   Scheduled Meds: . amLODipine  5 mg Per Tube Daily  . antiseptic oral rinse  15 mL Mouth Rinse QID  . aspirin  325 mg Per Tube Daily  . chlorhexidine  15 mL Mouth Rinse BID  . darbepoetin (ARANESP) injection - DIALYSIS  25 mcg Intravenous Q Thu-HD  . dexamethasone  4 mg Intravenous Q8H  . doxercalciferol  2 mcg Intravenous Q T,Th,Sa-HD  . feeding supplement (JEVITY 1.2 CAL)  1,000 mL Per Tube Q24H  . feeding supplement  30 mL Per Tube BID  . insulin aspart  0-9 Units Subcutaneous Q4H  . metoprolol tartrate  50 mg Per Tube BID  . multivitamin  5 mL Per Tube Daily  . pantoprazole sodium  40 mg Per Tube Q1200  . phenytoin  100 mg Per Tube BID  . sodium chloride  3 mL Intravenous Q12H  . sodium chloride  3 mL Intravenous Q12H    Continuous Infusions:   Kendell Bane RD, LDN, CNSC [phone] Pager [phone] After Hours Pager

## 2012-07-17 NOTE — Progress Notes (Signed)
EEG completed.

## 2012-07-17 NOTE — Procedures (Signed)
EEG report.  Brief clinical history: 79  years old female with altered mental status. Prior EEG showed findings suggestive of NCSE. Technique: this is a 17 channel routine scalp EEG performed at the bedside in the ICU environment, with bipolar and monopolar montages arranged in accordance to the international 10/20 system of electrode placement. One channel was dedicated to EKG recording.  The study was performed during wakefulness, drowsiness, and stage 2 sleep. No activating procedures performed.  Description: The study demonstrates diffuse, continuous 6-8 Hz theta activity with intermixed periods that seem to be compatible with stage 2 sleep. There is not evidence of electrographic seizures. No focal or generalized epileptiform discharges noted.  EKG showed sinus rhythm.  Impression: this is an abnormal EEG with findings suggestive of a mild encephalopathy. No evidence of electrographic seizures.  Clinical correlation is advised.  Wyatt Portela, MD

## 2012-07-17 NOTE — Progress Notes (Signed)
UR completed 

## 2012-07-17 NOTE — Progress Notes (Signed)
NEURO HOSPITALIST PROGRESS NOTE   SUBJECTIVE:                                                                                                                        Remarkably improved. Follows commands. EEG showed evidence of mild encephalopathy but no electrographic seizures. Off keppra and still dilantin.   OBJECTIVE:                                                                                                                           Vital signs in last 24 hours: Temp:  [97.3 F (36.3 C)-98.9 F (37.2 C)] 97.3 F (36.3 C) (06/09 1130) Pulse Rate:  [53-75] 59 (06/09 1430) Resp:  [12-24] 20 (06/09 1430) BP: (130-190)/(30-67) 160/36 mmHg (06/09 1430) SpO2:  [99 %-100 %] 100 % (06/09 1430) FiO2 (%):  [30 %-31.3 %] 30.8 % (06/09 1430) Weight:  [55.2 kg (121 lb 11.1 oz)] 55.2 kg (121 lb 11.1 oz) (06/09 0500)  Intake/Output from previous day: 06/08 0701 - 06/09 0700 In: 930 [NG/GT:930] Out: -  Intake/Output this shift: Total I/O In: 513 [P.O.:230; I.V.:3; NG/GT:280] Out: -  Nutritional status:    Past Medical History  Diagnosis Date  . Hyperlipidemia   . Hypertension   . Gout   . Chronic kidney disease   . Anemia       Neurologic Exam:  MS: alert and awake, follows simple commands.  CN 2-12: seem to be intact. Motor: moves all limbs symmetrically. Sensory: no tested.  DTR's: 1+ all over  Coordination and gait: no tested.  Plantars: no tested.   Lab Results: Lab Results  Component Value Date/Time   CHOL 104 07/05/2012  5:42 AM   Lipid Panel No results found for this basename: CHOL, TRIG, HDL, CHOLHDL, VLDL, LDLCALC,  in the last 72 hours  Studies/Results: Dg Chest Port 1 View  07/17/2012   *RADIOLOGY REPORT*  Clinical Data: Evaluate atelectasis  PORTABLE CHEST - 1 VIEW  Comparison: 07/16/2012; 07/15/2012  Findings: Grossly unchanged cardiac silhouette and mediastinal contours given patient rotation.  Stable position  of support apparatus.  No pneumothorax.  Grossly unchanged bibasilar heterogeneous opacity.  No new focal airspace opacity.  Mild pulmonary congestion without frank evidence of edema.  Trace right- sided pleural effusion is not excluded.  Unchanged bones.  IMPRESSION: 1.  Stable positioning of support apparatus.  No pneumothorax. 2.  Grossly unchanged findings of mild pulmonary venous congestion and bibasilar opacities, atelectasis versus infiltrate.   Original Report Authenticated By: Tacey Ruiz, MD   Dg Chest Port 1 View  07/16/2012   *RADIOLOGY REPORT*  Clinical Data: Follow up atelectasis  PORTABLE CHEST - 1 VIEW  Comparison: Prior chest x-ray 07/15/2012  Findings: The tip of the endotracheal tube is 1 cm above the carina. A nasogastric tube is in place.  The tip lies below the diaphragm off the field of view, presumably within the stomach. Right subclavian approach central venous catheter in stable position with the tip in the mid SVC.  Stable appearance of the chest with persistent low inspiratory volumes and left greater than right retrocardiac opacities which could reflect atelectasis versus consolidation.  A prominent skin fold in the right upper lung gives a pseudo pneumothorax appearance.  No acute osseous abnormality.  IMPRESSION:  1.  A nasogastric tube is in place.  The tip lies below the diaphragm off the field of view, presumably within the stomach.  2. The tip of the endotracheal tube is low at 1 cm above the carina.    Consider retracting 2 cm for more optimal placement.  3.  Stable appearance of the lungs with left greater than right bibasilar retrocardiac opacities which could reflect atelectasis or infiltrate.   Original Report Authenticated By: Malachy Moan, M.D.    MEDICATIONS                                                                                                                       I have reviewed the patient's current medications.  ASSESSMENT/PLAN:                                                                                                            Encephalopathy, improved. Will give dilantin 100 mg tomorrow x 1 dose and stop dilantin 6/11.  Wyatt Portela, MD [facility] [phone]  07/17/2012, 3:28 PM

## 2012-07-17 NOTE — Progress Notes (Signed)
PULMONARY  / CRITICAL CARE MEDICINE  Name: Christine Graham MRN: [id number] DOB: 1925-10-20    ADMISSION DATE:  07/04/2012 CONSULTATION DATE:  07/06/12  REFERRING MD :  Dr Tat PRIMARY SERVICE: [facility]  CHIEF COMPLAINT:  Altered MS  BRIEF PATIENT DESCRIPTION:  79 yo woman, hx ESRD, HTN, hyperlipidemia. Admitted with apparent viral syndrome, leukocytosis, edema pattern on CXR. Treated w empiric abx for HCAP. Since 5/27 progressive MS change to obtundation by am 5/29. EEG with epileptiform pattern. Brain MRI 5/28 with old CVA's, nothing acute. Received ativan and dilantin, PCCM called to address impending resp failure due to MS.   SIGNIFICANT EVENTS: 6/07 Extubated >> re-intubated for stridor, started decadron  STUDIES:  5/28 Head CT: Age indeterminate right cerebellar infarction. This could represent an acute infarction. New high density rounded lesion right adjacent to the pons. This could represent the dolichoectasia or aneurysm of the vertebral artery. A meningioma is a secondary consideration. 5/28 Brain MRI 5/28: No acute intracranial abnormality. The right inferior cerebellar infarct is remote. Advanced atrophy and white matter disease. Remote lacunar infarcts of the basal ganglia and left cerebellum. EEG 5/31: consistent with a severe cerebral dysfunction. No definite seizures were recorded during this EEG.   LINES / TUBES: ETT 5/29 >> 6/07 R Reading CVC 5/29 >>  Rt graft>>> ETT 6/07 >>  CULTURES: Blood 5/27 >> NEG Blood 5/29 >> NEG Sputum 5/27 >> few candida CSF bacterial 5/29 >> NEG CSF HSV 5/29 >> neg  ANTIBIOTICS: Vanco 5/27 >> 5/30 Cefepime 5/27 >> 5/30 Zosyn 5/28 x1 dose Acyclovir 5/29 >> 5/31  SUBJECTIVE: Tolerating pressure support.  For EEG this AM.  VITAL SIGNS: Temp:  [97.9 F (36.6 C)-98.9 F (37.2 C)] 97.9 F (36.6 C) (06/09 0400) Pulse Rate:  [54-75] 59 (06/09 0800) Resp:  [12-24] 16 (06/09 0800) BP: (133-190)/(32-67) 179/35 mmHg (06/09 0800) SpO2:  [99  %-100 %] 100 % (06/09 0800) FiO2 (%):  [30 %-31.3 %] 30.8 % (06/09 0800) Weight:  [121 lb 11.1 oz (55.2 kg)] 121 lb 11.1 oz (55.2 kg) (06/09 0500) VENTILATOR SETTINGS: Vent Mode:  [-] CPAP FiO2 (%):  [30 %-31.3 %] 30.8 % Set Rate:  [12 bmp] 12 bmp Vt Set:  [470 mL] 470 mL PEEP:  [5 cmH20] 5 cmH20 Pressure Support:  [10 cmH20] 10 cmH20 Plateau Pressure:  [13 cmH20-23 cmH20] 13 cmH20 INTAKE / OUTPUT:  Intake/Output Summary (Last 24 hours) at 07/17/12 0843 Last data filed at 07/17/12 0800  Gross per 24 hour  Intake    933 ml  Output      0 ml  Net    933 ml     PHYSICAL EXAMINATION: General: No distress Neuro: RASS 0, moves all extremities, follows commands HEENT: ETT, OG in place Cardiovascular: regular Lungs: no wheeze Abdomen: soft, non tender Ext: no edema   LABS: BMET Lab Results  Component Value Date   CREATININE 2.71* 07/16/2012   BUN 49* 07/16/2012   NA 136 07/16/2012   K 4.4 07/16/2012   CL 96 07/16/2012   CO2 30 07/16/2012     CBC Lab Results  Component Value Date   WBC 18.3* 07/17/2012   HGB 9.5* 07/17/2012   HCT 29.6* 07/17/2012   MCV 98.7 07/17/2012   PLT 382 07/17/2012   Imaging: Dg Chest Port 1 View  07/17/2012   *RADIOLOGY REPORT*  Clinical Data: Evaluate atelectasis  PORTABLE CHEST - 1 VIEW  Comparison: 07/16/2012; 07/15/2012  Findings: Grossly unchanged cardiac silhouette and mediastinal contours given  patient rotation.  Stable position of support apparatus.  No pneumothorax.  Grossly unchanged bibasilar heterogeneous opacity.  No new focal airspace opacity.  Mild pulmonary congestion without frank evidence of edema.  Trace right- sided pleural effusion is not excluded.  Unchanged bones.  IMPRESSION: 1.  Stable positioning of support apparatus.  No pneumothorax. 2.  Grossly unchanged findings of mild pulmonary venous congestion and bibasilar opacities, atelectasis versus infiltrate.   Original Report Authenticated By: Tacey Ruiz, MD   Dg Chest Port 1  View  07/16/2012   *RADIOLOGY REPORT*  Clinical Data: Follow up atelectasis  PORTABLE CHEST - 1 VIEW  Comparison: Prior chest x-ray 07/15/2012  Findings: The tip of the endotracheal tube is 1 cm above the carina. A nasogastric tube is in place.  The tip lies below the diaphragm off the field of view, presumably within the stomach. Right subclavian approach central venous catheter in stable position with the tip in the mid SVC.  Stable appearance of the chest with persistent low inspiratory volumes and left greater than right retrocardiac opacities which could reflect atelectasis versus consolidation.  A prominent skin fold in the right upper lung gives a pseudo pneumothorax appearance.  No acute osseous abnormality.  IMPRESSION:  1.  A nasogastric tube is in place.  The tip lies below the diaphragm off the field of view, presumably within the stomach.  2. The tip of the endotracheal tube is low at 1 cm above the carina.    Consider retracting 2 cm for more optimal placement.  3.  Stable appearance of the lungs with left greater than right bibasilar retrocardiac opacities which could reflect atelectasis or infiltrate.   Original Report Authenticated By: Malachy Moan, M.D.   Dg Chest Port 1 View  07/15/2012   *RADIOLOGY REPORT*  Clinical Data: Evaluate endotracheal tube placement.  PORTABLE CHEST - 1 VIEW  Comparison: Chest x-ray 07/15/2012.  Findings: An endotracheal tube is in place with tip 3.1 cm above the carina. There is a right-sided subclavian central venous catheter with tip terminating in the mid superior vena cava. Lung volumes are low.  Opacity in the medial aspect of the left bases similar to the prior study and may reflect some atelectasis and/or consolidation.  No pleural effusions.  No evidence of pulmonary edema.  Heart size is normal.  Mediastinal contours are unremarkable.  Atherosclerosis of the thoracic aorta.  IMPRESSION: 1.  Support apparatus, as above. 2.  Low lung volumes with  atelectasis and/or consolidation in the medial aspect of left lower lobe. 3.  Atherosclerosis.   Original Report Authenticated By: Trudie Reed, M.D.   Dg Abd Portable 1v  07/15/2012   *RADIOLOGY REPORT*  Clinical Data: NG tube placement.  PORTABLE ABDOMEN - 1 VIEW  Comparison: Chest x-ray 07/15/2012  Findings: Orogastric tube is in place, tip overlying the level of the duodenum, likely within the region of the bulb.  Bowel gas pattern is nonobstructive.  There are significant degenerative changes in the spine.  The patient has had previous right hip arthroplasty.  IMPRESSION: Orogastric tube tip overlying the level of the proximal duodenum.   Original Report Authenticated By: Norva Pavlov, M.D.    CBG (last 3)   Recent Labs  07/17/12 0006 07/17/12 0414 07/17/12 0752  GLUCAP 131* 133* 124*      ASSESSMENT / PLAN:  NEUROLOGIC A:   Severe encephalopathy - probably cefepime induced  >> much improved. Status epilepticus - resolved. P:   -continue dilantin per neuro >>  if EEG 6/09 unremarkable, then may be able to wean off AED completely  PULMONARY A: Acute Respiratory failure due to altered MS. B pleural effusions - resolved. Post-extubation stridor developed 6/07. P:   -pressure support wean as tolerated >> defer extubation for now -continue decadron -f/u CXR   CARDIOVASCULAR A: Shock >> resolved. Hx of HTN. P:  -continue ASA, hydralazine, metoprolol  RENAL A:  ESRD on HD P:   -HD T, Th, Sat per renal   GASTROINTESTINAL A: Nutrition. Dysphagia. P:   -tube feeds while on vent -protonix for SUP  HEMATOLOGIC A: anemia of chronic disease. Leukocytosis >> likely from decadron. P:  -f/u CBC intermittently  INFECTIOUS A:  No evidence for infectious process.  Concern that cefepime contributed to seizures. P:   -Monitor off Abx  ENDOCRINE A: No issues. P:   -Monitor blood sugar on BMET  CC time 35 minutes.  [name], MD [facility] Pulmonary/Critical  Care 07/17/2012, 8:43 AM Pager:  [phone] After 3pm call: [phone]

## 2012-07-17 NOTE — Progress Notes (Signed)
.   [facility]  Subjective:  Awake, opens eyes, intubated, boyfriend at bedside Dialyzed on 7 Jun   Objective: Vital signs in last 24 hours: Blood pressure 167/36, pulse 59, temperature 97.9 F (36.6 C), temperature source Oral, resp. rate 16, height 5' 6.93" (1.7 m), weight 55.2 kg (121 lb 11.1 oz), SpO2 100.00%.    PHYSICAL EXAM General--as above  Chest--3 lumen cath in R IJ Heart--no rub Abd--nontender Extr--no edema, AVF patent in R UA  Lab Results:   Recent Labs Lab 07/13/12 0730  07/15/12 0500 07/16/12 0440 07/16/12 1005  NA 139  < > 137 137 136  K 3.9  < > 3.7 4.5 4.4  CL 99  < > 95* 96 96  CO2 27  < > 31 30 30   BUN 61*  < > 45* 41* 49*  CREATININE 4.57*  < > 2.56* 2.28* 2.71*  GLUCOSE 146*  < > 131* 155* 138*  CALCIUM 8.6  < > 9.0 8.5 8.8  PHOS 3.1  --   --  2.9 3.2  < > = values in this interval not displayed.   Recent Labs  07/16/12 0500 07/17/12 0210  WBC 16.6* 18.3*  HGB 9.4* 9.5*  HCT 29.4* 29.6*  PLT 349 382     I have reviewed the patient's current medications. Scheduled: . amLODipine  5 mg Per Tube Daily  . antiseptic oral rinse  15 mL Mouth Rinse QID  . aspirin  325 mg Per Tube Daily  . chlorhexidine  15 mL Mouth Rinse BID  . darbepoetin (ARANESP) injection - DIALYSIS  25 mcg Intravenous Q Thu-HD  . dexamethasone  4 mg Intravenous Q8H  . doxercalciferol  2 mcg Intravenous Q T,Th,Sa-HD  . feeding supplement (JEVITY 1.2 CAL)  1,000 mL Per Tube Q24H  . feeding supplement  30 mL Per Tube BID  . insulin aspart  0-9 Units Subcutaneous Q4H  . metoprolol tartrate  50 mg Per Tube BID  . multivitamin  5 mL Per Tube Daily  . pantoprazole sodium  40 mg Per Tube Q1200  . phenytoin  100 mg Per Tube BID  . sodium chloride  3 mL Intravenous Q12H  . sodium chloride  3 mL Intravenous Q12H    Assessment/Plan: 1. AMS/status epilepticus- felt due to cefipime toxicity. Looks much more alert today.   Off Keppra now. I'm j\hopeful  dilantin can be stopped soon if she remains stable. Ordered free dilantin level--not back.  Total dilantin level of 6 should be therapeutic as total dilantin levels in dialysis patients re usually 3-6 when levels are 1-2 2. VDRF- on vent due to severe debility. Failed attempt to extubate 6/7.  On decadron per pulmonary because of stridor after 1st attempt at extubation 3. ESRD, cont HD TTS--orders written for tomorrow 4. Pulm edema- resolved 5. HTN/volume- euvolemic, 3-4kg below dry wt. Was on norvasc and atenolol at home, getting norvasc and metoprolol here.  6. Anemia of ESRD- EPO had been at 2200 at center / Aranesp in hosp 25 qthurs. Hb 9-10 range.  Check Fe studies 7. Metabolic Bone Disease- on hectorol, resume binders when taking po        LOS: 13 days   Christine Graham F 07/17/2012,10:12 AM   .labalb

## 2012-07-18 ENCOUNTER — Inpatient Hospital Stay (HOSPITAL_COMMUNITY): Payer: Medicare Other

## 2012-07-18 LAB — GLUCOSE, CAPILLARY
Glucose-Capillary: 106 mg/dL — ABNORMAL HIGH (ref 70–99)
Glucose-Capillary: 115 mg/dL — ABNORMAL HIGH (ref 70–99)
Glucose-Capillary: 127 mg/dL — ABNORMAL HIGH (ref 70–99)
Glucose-Capillary: 158 mg/dL — ABNORMAL HIGH (ref 70–99)

## 2012-07-18 LAB — CBC
MCHC: 32.7 g/dL (ref 30.0–36.0)
Platelets: 394 10*3/uL (ref 150–400)
RDW: 16 % — ABNORMAL HIGH (ref 11.5–15.5)
WBC: 18.8 10*3/uL — ABNORMAL HIGH (ref 4.0–10.5)

## 2012-07-18 LAB — RENAL FUNCTION PANEL
CO2: 26 mEq/L (ref 19–32)
Chloride: 91 mEq/L — ABNORMAL LOW (ref 96–112)
GFR calc Af Amer: 8 mL/min — ABNORMAL LOW (ref >90)
GFR calc non Af Amer: 7 mL/min — ABNORMAL LOW (ref >90)
Glucose, Bld: 113 mg/dL — ABNORMAL HIGH (ref 70–99)
Sodium: 134 mEq/L — ABNORMAL LOW (ref 135–145)

## 2012-07-18 MED ORDER — DOXERCALCIFEROL 4 MCG/2ML IV SOLN
INTRAVENOUS | Status: AC
  Administered 2012-07-18: 2 ug via INTRAVENOUS
  Filled 2012-07-18: qty 2

## 2012-07-18 MED ORDER — HEPARIN SODIUM (PORCINE) 1000 UNIT/ML DIALYSIS: 1000.0000 [IU] | INTRAMUSCULAR | Status: DC | PRN

## 2012-07-18 MED ORDER — NEPRO/CARBSTEADY PO LIQD: 237.0000 mL | ORAL | Status: DC | PRN

## 2012-07-18 MED ORDER — AMLODIPINE BESYLATE 10 MG PO TABS
10.0000 mg | ORAL_TABLET | Freq: Every day | ORAL | Status: DC
  Filled 2012-07-18: qty 1

## 2012-07-18 MED ORDER — SODIUM CHLORIDE 0.9 % IV SOLN: 100.0000 mL | INTRAVENOUS | Status: DC | PRN

## 2012-07-18 MED ORDER — METOPROLOL TARTRATE 25 MG/10 ML ORAL SUSPENSION
25.0000 mg | Freq: Two times a day (BID) | ORAL | Status: DC
  Administered 2012-07-18 (×2): 25 mg
  Filled 2012-07-18 (×3): qty 10

## 2012-07-18 MED ORDER — HEPARIN SODIUM (PORCINE) 1000 UNIT/ML DIALYSIS
20.0000 [IU]/kg | INTRAMUSCULAR | Status: DC | PRN
  Administered 2012-07-18: 1100 [IU] via INTRAVENOUS_CENTRAL

## 2012-07-18 MED ORDER — DARBEPOETIN ALFA-POLYSORBATE 100 MCG/0.5ML IJ SOLN
100.0000 ug | INTRAMUSCULAR | Status: DC
  Administered 2012-07-18: 100 ug via INTRAVENOUS
  Filled 2012-07-18 (×2): qty 0.5

## 2012-07-18 MED ORDER — LIDOCAINE HCL (PF) 1 % IJ SOLN: 5.0000 mL | INTRAMUSCULAR | Status: DC | PRN

## 2012-07-18 MED ORDER — ALTEPLASE 2 MG IJ SOLR: 2.0000 mg | Freq: Once | INTRAMUSCULAR | Status: AC | PRN

## 2012-07-18 MED ORDER — LIDOCAINE-PRILOCAINE 2.5-2.5 % EX CREA: 1.0000 "application " | TOPICAL_CREAM | CUTANEOUS | Status: DC | PRN

## 2012-07-18 MED ORDER — PENTAFLUOROPROP-TETRAFLUOROETH EX AERO: 1.0000 "application " | INHALATION_SPRAY | CUTANEOUS | Status: DC | PRN

## 2012-07-18 MED ORDER — AMLODIPINE BESYLATE 5 MG PO TABS
5.0000 mg | ORAL_TABLET | Freq: Every day | ORAL | Status: DC
  Administered 2012-07-18 – 2012-07-19 (×2): 5 mg
  Filled 2012-07-18: qty 1

## 2012-07-18 NOTE — Progress Notes (Signed)
PULMONARY  / CRITICAL CARE MEDICINE  Name: ANALYSIA DUNGEE MRN: [id number] DOB: 1925-04-11    ADMISSION DATE:  07/04/2012 CONSULTATION DATE:  07/06/12  REFERRING MD :  Dr Tat PRIMARY SERVICE: [facility]  CHIEF COMPLAINT:  Altered MS  BRIEF PATIENT DESCRIPTION:  79 yo woman, hx ESRD, HTN, hyperlipidemia. Admitted with apparent viral syndrome, leukocytosis, edema pattern on CXR. Treated w empiric abx for HCAP. Since 5/27 progressive MS change to obtundation by am 5/29. EEG with epileptiform pattern. Brain MRI 5/28 with old CVA's, nothing acute. Received ativan and dilantin, PCCM called to address impending resp failure due to MS.   SIGNIFICANT EVENTS: 6/07 Extubated >> re-intubated for stridor, started decadron  STUDIES:  5/28 Head CT: Age indeterminate right cerebellar infarction. This could represent an acute infarction. New high density rounded lesion right adjacent to the pons. This could represent the dolichoectasia or aneurysm of the vertebral artery. A meningioma is a secondary consideration. 5/28 Brain MRI 5/28: No acute intracranial abnormality. The right inferior cerebellar infarct is remote. Advanced atrophy and white matter disease. Remote lacunar infarcts of the basal ganglia and left cerebellum. EEG 5/31: consistent with a severe cerebral dysfunction. No definite seizures were recorded during this EEG.  EEG 6/09: mild encephalopathy, no seizure activty.  LINES / TUBES: ETT 5/29 >> 6/07 R  CVC 5/29 >>  Rt graft>>> ETT 6/07 >>  CULTURES: Blood 5/27 >> NEG Blood 5/29 >> NEG Sputum 5/27 >> few candida CSF bacterial 5/29 >> NEG CSF HSV 5/29 >> neg  ANTIBIOTICS: Vanco 5/27 >> 5/30 Cefepime 5/27 >> 5/30 Zosyn 5/28 x1 dose Acyclovir 5/29 >> 5/31  SUBJECTIVE: Tolerating pressure support.  Getting HD.  VITAL SIGNS: Temp:  [97.3 F (36.3 C)-97.6 F (36.4 C)] 97.3 F (36.3 C) (06/10 0400) Pulse Rate:  [47-90] 57 (06/10 0733) Resp:  [12-22] 22 (06/10 0733) BP:  (130-195)/(30-130) 186/42 mmHg (06/10 0733) SpO2:  [100 %] 100 % (06/10 0733) FiO2 (%):  [30 %-31.4 %] 30 % (06/10 0729) Weight:  [128 lb 4.9 oz (58.2 kg)] 128 lb 4.9 oz (58.2 kg) (06/10 0500) VENTILATOR SETTINGS: Vent Mode:  [-] CPAP FiO2 (%):  [30 %-31.4 %] 30 % Set Rate:  [12 bmp] 12 bmp Vt Set:  [470 mL] 470 mL PEEP:  [5 cmH20] 5 cmH20 Pressure Support:  [5 cmH20-10 cmH20] 5 cmH20 Plateau Pressure:  [15 cmH20-16 cmH20] 16 cmH20 INTAKE / OUTPUT:  Intake/Output Summary (Last 24 hours) at 07/18/12 0736 Last data filed at 07/18/12 0700  Gross per 24 hour  Intake   1073 ml  Output      0 ml  Net   1073 ml     PHYSICAL EXAMINATION: General: No distress Neuro: RASS 0, moves all extremities, follows commands HEENT: ETT, OG in place Cardiovascular: regular Lungs: no wheeze Abdomen: soft, non tender Ext: no edema   LABS: BMET Lab Results  Component Value Date   CREATININE 4.27* 07/17/2012   BUN 91* 07/17/2012   NA 135 07/17/2012   K 4.8 07/17/2012   CL 92* 07/17/2012   CO2 27 07/17/2012     CBC Lab Results  Component Value Date   WBC 18.3* 07/17/2012   HGB 9.5* 07/17/2012   HCT 29.6* 07/17/2012   MCV 98.7 07/17/2012   PLT 382 07/17/2012   Imaging: Dg Chest Port 1 View  07/17/2012   *RADIOLOGY REPORT*  Clinical Data: Evaluate atelectasis  PORTABLE CHEST - 1 VIEW  Comparison: 07/16/2012; 07/15/2012  Findings: Grossly unchanged cardiac silhouette and  mediastinal contours given patient rotation.  Stable position of support apparatus.  No pneumothorax.  Grossly unchanged bibasilar heterogeneous opacity.  No new focal airspace opacity.  Mild pulmonary congestion without frank evidence of edema.  Trace right- sided pleural effusion is not excluded.  Unchanged bones.  IMPRESSION: 1.  Stable positioning of support apparatus.  No pneumothorax. 2.  Grossly unchanged findings of mild pulmonary venous congestion and bibasilar opacities, atelectasis versus infiltrate.   Original Report Authenticated By:  Tacey Ruiz, MD    CBG (last 3)   Recent Labs  07/17/12 1948 07/18/12 0010 07/18/12 0427  GLUCAP 138* 158* 138*      ASSESSMENT / PLAN:  NEUROLOGIC A:   Severe encephalopathy - probably cefepime induced  >> much improved. Status epilepticus - resolved. P:   -continue dilantin per neuro >> plan to d/c after dose on 6/10  PULMONARY A: Acute Respiratory failure due to altered MS. B pleural effusions - resolved. Post-extubation stridor developed 6/07. P:   -pressure support wean as tolerated >> plan for extubation 6/11 if she has good cuff leak -continue decadron 4 mg q8h for now -f/u CXR   CARDIOVASCULAR A: Shock >> resolved. Hx of HTN. P:  -continue ASA, hydralazine, metoprolol -increase amlodipine to 10 mg qdaily  RENAL A:  ESRD on HD P:   -HD T, Th, Sat per renal   GASTROINTESTINAL A: Nutrition. Dysphagia. P:   -tube feeds while on vent -protonix for SUP -will need swallow eval after extubation  HEMATOLOGIC A: anemia of chronic disease. Leukocytosis >> likely from decadron. P:  -f/u CBC intermittently -continue aranesp per renal  INFECTIOUS A:  No evidence for infectious process.  Concern that cefepime contributed to seizures. P:   -Monitor off Abx  ENDOCRINE A: Steroid induced hyperglycemia. P:   -SSI while on decadron  CC time 35 minutes.  [name], MD [facility] Pulmonary/Critical Care 07/18/2012, 7:36 AM Pager:  [phone] After 3pm call: [phone]

## 2012-07-18 NOTE — Procedures (Signed)
[facility]  On HD via R UA AVF BP--120/37 Goal--2 L Hgb--8.8   K+--4.6 With low BP, amlodipine and metoprolol have been decreased (+ hold for systolic < 120)

## 2012-07-18 NOTE — Progress Notes (Signed)
NEURO HOSPITALIST PROGRESS NOTE   SUBJECTIVE:                                                                                                                        Called earlier because Mrs. Shuttleworth was noted to be less responsive, groggier than yesterday. Now she is alert and awake and following commands. Boyfriend at bedside said that she is much better now. Getting HD. Off keppra and weaning off dilantin.  OBJECTIVE:                                                                                                                           Vital signs in last 24 hours: Temp:  [97.3 F (36.3 C)-97.7 F (36.5 C)] 97.7 F (36.5 C) (06/10 0802) Pulse Rate:  [47-90] 53 (06/10 1015) Resp:  [12-23] 19 (06/10 1015) BP: (94-195)/(30-130) 132/36 mmHg (06/10 1015) SpO2:  [99 %-100 %] 100 % (06/10 1015) FiO2 (%):  [30 %-31.4 %] 31 % (06/10 1000) Weight:  [55.9 kg (123 lb 3.8 oz)-58.2 kg (128 lb 4.9 oz)] 55.9 kg (123 lb 3.8 oz) (06/10 0703)  Intake/Output from previous day: 06/09 0701 - 06/10 0700 In: 1073 [P.O.:230; I.V.:3; NG/GT:840] Out: -  Intake/Output this shift: Total I/O In: 105 [NG/GT:105] Out: -  Nutritional status:    Past Medical History  Diagnosis Date  . Hyperlipidemia   . Hypertension   . Gout   . Chronic kidney disease   . Anemia     Neurologic Exam:  MS: alert and awake, follows simple commands.  CN 2-12: seem to be intact.  Motor: moves all limbs symmetrically.  Sensory: no tested.  DTR's: 1+ all over  Coordination and gait: no tested.  Plantars: no tested.   Lab Results: Lab Results  Component Value Date/Time   CHOL 104 07/05/2012  5:42 AM   Lipid Panel No results found for this basename: CHOL, TRIG, HDL, CHOLHDL, VLDL, LDLCALC,  in the last 72 hours  Studies/Results: Dg Chest Port 1 View  07/18/2012   *RADIOLOGY REPORT*  Clinical Data: Follow up atelectasis  PORTABLE CHEST - 1 VIEW  Comparison: Prior chest  x-ray 07/17/2012  Findings: The tip of the endotracheal tube is less than 1 cm  above the carina and is directed at the right mainstem bronchus.  The right subclavian approach central venous catheter is in unchanged position with the tip in the mid SVC.  Nasogastric tube is incompletely imaged, the tip lies below the field of view likely within the stomach.  Stable cardiomegaly.  No pneumothorax identified.  Small left pleural effusion and associated atelectasis.  Pulmonary vascular congestion is similar to slightly improved.  IMPRESSION:  1. The tip of the endotracheal tube is just above the carina and directed toward the right mainstem bronchus.  Recommend withdrawing 2 - 3 cm for more optimal placement. 2.  Similar to slightly improved pulmonary vascular congestion 3.  Small left pleural effusion and associated atelectasis.  These results were called by telephone on 07/18/2012 at 07:45 a.m. to the patient's nurse, Leotis Shames,, who verbally acknowledged these results.   Original Report Authenticated By: Malachy Moan, M.D.   Dg Chest Port 1 View  07/17/2012   *RADIOLOGY REPORT*  Clinical Data: Evaluate atelectasis  PORTABLE CHEST - 1 VIEW  Comparison: 07/16/2012; 07/15/2012  Findings: Grossly unchanged cardiac silhouette and mediastinal contours given patient rotation.  Stable position of support apparatus.  No pneumothorax.  Grossly unchanged bibasilar heterogeneous opacity.  No new focal airspace opacity.  Mild pulmonary congestion without frank evidence of edema.  Trace right- sided pleural effusion is not excluded.  Unchanged bones.  IMPRESSION: 1.  Stable positioning of support apparatus.  No pneumothorax. 2.  Grossly unchanged findings of mild pulmonary venous congestion and bibasilar opacities, atelectasis versus infiltrate.   Original Report Authenticated By: Tacey Ruiz, MD    MEDICATIONS                                                                                                                        I have reviewed the patient's current medications.  ASSESSMENT/PLAN:                                                                                                            Encephalopathy, much improved. Will stop dilantin tomorrow. No other issues at this moment.  Wyatt Portela, MD [facility] [phone]  07/18/2012, 10:33 AM

## 2012-07-18 NOTE — Progress Notes (Signed)
0915 During dialysis the pt. BP was 100/35. Pt became less responsive and was not following commands. Dr. Craige Cotta notified. 1000 BP 129/37 with pt alert and following commands.

## 2012-07-18 NOTE — Progress Notes (Signed)
 [facility]  Subjective:  Awake, follows commands, boyfriend at bedside   Objective: Vital signs in last 24 hours: Blood pressure 129/37, pulse 58, temperature 97.7 Graham (36.5 C), temperature source Axillary, resp. rate 22, height 5' 6.93" (1.7 m), weight 55.9 kg (123 lb 3.8 oz), SpO2 100.00%.    PHYSICAL EXAM General--As above Chest--rhonchi Heart--no rub Abd--nontender Extr--no edema, AVF patent R UA  Lab Results:   Recent Labs Lab 07/16/12 1005 07/17/12 1015 07/18/12 0730  NA 136 135 134*  K 4.4 4.8 4.6  CL 96 92* 91*  CO2 30 27 26   BUN 49* 91* 125*  CREATININE 2.71* 4.27* 5.33*  GLUCOSE 138* 148* 113*  CALCIUM 8.8 8.6 8.2*  PHOS 3.2 4.4 5.2*     Recent Labs  07/17/12 0210 07/18/12 0730  WBC 18.3* 18.8*  HGB 9.5* 8.8*  HCT 29.6* 26.9*  PLT 382 394     I have reviewed the patient's current medications. Scheduled: . [START ON 07/19/2012] amLODipine  5 mg Per Tube Daily  . antiseptic oral rinse  15 mL Mouth Rinse QID  . aspirin  325 mg Per Tube Daily  . chlorhexidine  15 mL Mouth Rinse BID  . darbepoetin (ARANESP) injection - DIALYSIS  25 mcg Intravenous Q Thu-HD  . dexamethasone  4 mg Intravenous Q8H  . doxercalciferol  2 mcg Intravenous Q T,Th,Sa-HD  . feeding supplement (JEVITY 1.2 CAL)  1,000 mL Per Tube Q24H  . feeding supplement  30 mL Per Tube BID  . insulin aspart  0-9 Units Subcutaneous Q4H  . metoprolol tartrate  25 mg Per Tube BID  . multivitamin  5 mL Per Tube Daily  . pantoprazole sodium  40 mg Per Tube Q1200  . phenytoin  100 mg Per Tube BID  . sodium chloride  3 mL Intravenous Q12H    Assessment/Plan: 1. AMS/status epilepticus- felt due to cefipime toxicity. Looks much more alert today. Off Keppra now--dilantin last dose tomorrow. 2. VDRF- on vent due to severe debility. Failed attempt to extubate 6/7. On decadron per pulmonary because of stridor after 1st attempt at extubation.  Dr. Craige Cotta is hopeful she can be   extubated tomorrow 3. ESRD, cont HD TTS--currently on HD 4. Pulm edema- resolved 5. HTN/volume- euvolemic, 3-4kg below dry wt. Was on norvasc and atenolol at home, getting norvasc and metoprolol here. Willl decrease norvasc to 5/d and metoprolol to 25 BID and hold for systolic< 120 6. Anemia of ESRD- EPO had been at 2200 at center / Aranesp in hosp 25 qthurs--will increase to 100/week. Hb 8.8 today range.  Fe/TIBC 54 %, ferritin 2485--no IV Fe 7. Metabolic Bone Disease- on hectorol, resume binders when taking po        LOS: 14 days   Christine Graham 07/18/2012,10:07 AM   .labalb

## 2012-07-19 ENCOUNTER — Inpatient Hospital Stay (HOSPITAL_COMMUNITY): Payer: Medicare Other

## 2012-07-19 DIAGNOSIS — Z5189 Encounter for other specified aftercare: Secondary | ICD-10-CM

## 2012-07-19 LAB — RENAL FUNCTION PANEL
GFR calc Af Amer: 16 mL/min — ABNORMAL LOW (ref >90)
GFR calc non Af Amer: 14 mL/min — ABNORMAL LOW (ref >90)
Glucose, Bld: 128 mg/dL — ABNORMAL HIGH (ref 70–99)
Phosphorus: 3.9 mg/dL (ref 2.3–4.6)
Potassium: 4.5 mEq/L (ref 3.5–5.1)
Sodium: 135 mEq/L (ref 135–145)

## 2012-07-19 LAB — CBC
HCT: 27.8 % — ABNORMAL LOW (ref 36.0–46.0)
Hemoglobin: 9.2 g/dL — ABNORMAL LOW (ref 12.0–15.0)
Hemoglobin: 9.3 g/dL — ABNORMAL LOW (ref 12.0–15.0)
MCHC: 32.1 g/dL (ref 30.0–36.0)
MCV: 96.9 fL (ref 78.0–100.0)
Platelets: 384 10*3/uL (ref 150–400)
RBC: 2.87 MIL/uL — ABNORMAL LOW (ref 3.87–5.11)
RDW: 16.3 % — ABNORMAL HIGH (ref 11.5–15.5)
RDW: 16.4 % — ABNORMAL HIGH (ref 11.5–15.5)
WBC: 16.9 10*3/uL — ABNORMAL HIGH (ref 4.0–10.5)

## 2012-07-19 LAB — GLUCOSE, CAPILLARY
Glucose-Capillary: 108 mg/dL — ABNORMAL HIGH (ref 70–99)
Glucose-Capillary: 138 mg/dL — ABNORMAL HIGH (ref 70–99)
Glucose-Capillary: 107 mg/dL — ABNORMAL HIGH (ref 70–99)
Glucose-Capillary: 107 mg/dL — ABNORMAL HIGH (ref 70–99)
Glucose-Capillary: 107 mg/dL — ABNORMAL HIGH (ref 70–99)
Glucose-Capillary: 110 mg/dL — ABNORMAL HIGH (ref 70–99)

## 2012-07-19 LAB — PHENYTOIN LEVEL, FREE AND TOTAL: Phenytoin, Free: <0.5 mg/L (ref 1.0–2.0)

## 2012-07-19 MED ORDER — METOPROLOL TARTRATE 25 MG PO TABS
25.0000 mg | ORAL_TABLET | Freq: Two times a day (BID) | ORAL | Status: DC
  Administered 2012-07-19 – 2012-07-31 (×22): 25 mg via ORAL
  Filled 2012-07-19 (×25): qty 1

## 2012-07-19 MED ORDER — AMLODIPINE BESYLATE 5 MG PO TABS
5.0000 mg | ORAL_TABLET | Freq: Every day | ORAL | Status: DC
  Filled 2012-07-19: qty 1

## 2012-07-19 MED ORDER — LABETALOL HCL 5 MG/ML IV SOLN
INTRAVENOUS | Status: AC
  Administered 2012-07-19: 10 mg via INTRAVENOUS
  Filled 2012-07-19: qty 4

## 2012-07-19 MED ORDER — OXYCODONE HCL 5 MG PO TABS
ORAL_TABLET | ORAL | Status: AC
  Filled 2012-07-19: qty 2

## 2012-07-19 MED ORDER — ASPIRIN EC 325 MG PO TBEC
325.0000 mg | DELAYED_RELEASE_TABLET | Freq: Every day | ORAL | Status: DC
  Administered 2012-07-20 – 2012-07-25 (×6): 325 mg via ORAL
  Filled 2012-07-19 (×6): qty 1

## 2012-07-19 MED ORDER — RENA-VITE PO TABS
1.0000 | ORAL_TABLET | Freq: Every day | ORAL | Status: DC
  Administered 2012-07-20 – 2012-07-25 (×6): 1 via ORAL
  Filled 2012-07-19 (×6): qty 1

## 2012-07-19 MED ORDER — PANTOPRAZOLE SODIUM 40 MG PO TBEC
40.0000 mg | DELAYED_RELEASE_TABLET | Freq: Every day | ORAL | Status: DC
  Administered 2012-07-20 – 2012-07-21 (×2): 40 mg via ORAL
  Filled 2012-07-19 (×2): qty 1

## 2012-07-19 MED ORDER — LABETALOL HCL 5 MG/ML IV SOLN
10.0000 mg | INTRAVENOUS | Status: DC | PRN
  Administered 2012-07-19: 10 mg via INTRAVENOUS
  Filled 2012-07-19 (×2): qty 4

## 2012-07-19 NOTE — Progress Notes (Signed)
PULMONARY  / CRITICAL CARE MEDICINE  Name: Christine Graham MRN: [id number] DOB: [birth year]    ADMISSION DATE:  07/04/2012 CONSULTATION DATE:  07/06/12  REFERRING MD :  Dr Tat PRIMARY SERVICE: [facility]  CHIEF COMPLAINT:  Altered MS  BRIEF PATIENT DESCRIPTION:  79 yo woman, hx ESRD, HTN, hyperlipidemia. Admitted with apparent viral syndrome, leukocytosis, edema pattern on CXR. Treated w empiric abx for HCAP. Since 5/27 progressive MS change to obtundation by am 5/29. EEG with epileptiform pattern. Brain MRI 5/28 with old CVA's, nothing acute. Received ativan and dilantin, PCCM called to address impending resp failure due to MS.   SIGNIFICANT EVENTS: 6/07 Extubated >> re-intubated for stridor, started decadron 6/10 Dilantin d/c'ed  STUDIES:  5/28 Head CT: Age indeterminate right cerebellar infarction. This could represent an acute infarction. New high density rounded lesion right adjacent to the pons. This could represent the dolichoectasia or aneurysm of the vertebral artery. A meningioma is a secondary consideration. 5/28 Brain MRI 5/28: No acute intracranial abnormality. The right inferior cerebellar infarct is remote. Advanced atrophy and white matter disease. Remote lacunar infarcts of the basal ganglia and left cerebellum. EEG 5/31: consistent with a severe cerebral dysfunction. No definite seizures were recorded during this EEG.  EEG 6/09: mild encephalopathy, no seizure activty.  LINES / TUBES: ETT 5/29 >> 6/07 R [place] CVC 5/29 >>  Rt graft>>> ETT 6/07 >> 6/11  CULTURES: Blood 5/27 >> NEG Blood 5/29 >> NEG Sputum 5/27 >> few candida CSF bacterial 5/29 >> NEG CSF HSV 5/29 >> neg  ANTIBIOTICS: Vanco 5/27 >> 5/30 Cefepime 5/27 >> 5/30 Zosyn 5/28 x1 dose Acyclovir 5/29 >> 5/31  SUBJECTIVE: Tolerating pressure support.  Positive cuff leak.  VITAL SIGNS: Temp:  [97.8 F (36.6 C)-98.5 F (36.9 C)] 98.3 F (36.8 C) (06/11 0729) Pulse Rate:  [53-77] 77 (06/11 0729) Resp:   [12-24] 14 (06/11 0729) BP: (94-176)/(31-66) 135/33 mmHg (06/11 0729) SpO2:  [98 %-100 %] 100 % (06/11 0729) FiO2 (%):  [30 %-31.3 %] 30 % (06/11 0729) Weight:  [121 lb 4.1 oz (55 kg)-123 lb 14.4 oz (56.2 kg)] 121 lb 4.1 oz (55 kg) (06/11 0500) VENTILATOR SETTINGS: Vent Mode:  [-] CPAP FiO2 (%):  [30 %-31.3 %] 30 % Set Rate:  [12 bmp] 12 bmp Vt Set:  [470 mL] 470 mL PEEP:  [5 cmH20] 5 cmH20 Pressure Support:  [5 cmH20-10 cmH20] 5 cmH20 Plateau Pressure:  [16 cmH20-17 cmH20] 17 cmH20 INTAKE / OUTPUT:  Intake/Output Summary (Last 24 hours) at 07/19/12 0843 Last data filed at 07/19/12 0700  Gross per 24 hour  Intake    793 ml  Output    456 ml  Net    337 ml     PHYSICAL EXAMINATION: General: No distress Neuro: RASS 0, moves all extremities, follows commands HEENT: ETT, OG in place Cardiovascular: regular Lungs: no wheeze Abdomen: soft, non tender Ext: no edema   LABS: BMET Lab Results  Component Value Date   CREATININE 2.93* 07/19/2012   BUN 61* 07/19/2012   NA 135 07/19/2012   K 4.5 07/19/2012   CL 94* 07/19/2012   CO2 29 07/19/2012     CBC Lab Results  Component Value Date   WBC 19.1* 07/19/2012   HGB 9.3* 07/19/2012   HCT 29.0* 07/19/2012   MCV 98.6 07/19/2012   PLT 384 07/19/2012   Imaging: Dg Chest Port 1 View  07/19/2012   *RADIOLOGY REPORT*  Clinical Data: Atelectasis.  PORTABLE CHEST - 1 VIEW  Comparison: 07/18/2012.  Findings: Endotracheal tube is in satisfactory position. Nasogastric tube is followed into the stomach.  Right subclavian central line tip projects over the SVC.  Heart size normal. Thoracic aorta is calcified.  Mild bibasilar air space disease with a small left pleural effusion.  There may be a tiny right pleural effusion as well.  IMPRESSION: Mild bibasilar air space disease and small left pleural effusion. Possible tiny right pleural effusion as well.   Original Report Authenticated By: Leanna Battles, M.D.   Dg Chest Port 1 View  07/18/2012    *RADIOLOGY REPORT*  Clinical Data: Follow up atelectasis  PORTABLE CHEST - 1 VIEW  Comparison: Prior chest x-ray 07/17/2012  Findings: The tip of the endotracheal tube is less than 1 cm above the carina and is directed at the right mainstem bronchus.  The right subclavian approach central venous catheter is in unchanged position with the tip in the mid SVC.  Nasogastric tube is incompletely imaged, the tip lies below the field of view likely within the stomach.  Stable cardiomegaly.  No pneumothorax identified.  Small left pleural effusion and associated atelectasis.  Pulmonary vascular congestion is similar to slightly improved.  IMPRESSION:  1. The tip of the endotracheal tube is just above the carina and directed toward the right mainstem bronchus.  Recommend withdrawing 2 - 3 cm for more optimal placement. 2.  Similar to slightly improved pulmonary vascular congestion 3.  Small left pleural effusion and associated atelectasis.  These results were called by telephone on 07/18/2012 at 07:45 a.m. to the patient's nurse, Leotis Shames,, who verbally acknowledged these results.   Original Report Authenticated By: Malachy Moan, M.D.    CBG (last 3)   Recent Labs  07/18/12 2353 07/19/12 0436 07/19/12 0810  GLUCAP 138* 108* 116*      ASSESSMENT / PLAN:  NEUROLOGIC A:   Severe encephalopathy - probably cefepime induced  >> much improved. Status epilepticus - resolved. P:   -monitor off dilantin  PULMONARY A: Acute Respiratory failure due to altered MS. B pleural effusions - resolved. Post-extubation stridor developed 6/07. P:   -proceed with extubation >> pt/family agreeable to re-intubation/trach if needed -continue decadron 4 mg q8h for now -f/u CXR as needed  CARDIOVASCULAR A: Shock >> resolved. Hx of HTN. P:  -continue ASA, hydralazine, metoprolol, amlodipine  RENAL A:  ESRD on HD P:   -HD T, Th, Sat per renal   GASTROINTESTINAL A: Nutrition. Dysphagia. P:   -protonix for  SUP -will need swallow eval after extubation  HEMATOLOGIC A: anemia of chronic disease. Leukocytosis >> likely from decadron. P:  -f/u CBC intermittently -continue aranesp per renal  INFECTIOUS A:  No evidence for infectious process.  Concern that cefepime contributed to seizures. P:   -Monitor off Abx  ENDOCRINE A: Steroid induced hyperglycemia. P:   -SSI while on decadron  Updated pt's son over the phone.  CC time 35 minutes.  [name], MD [facility] Pulmonary/Critical Care 07/19/2012, 8:43 AM Pager:  [phone] After 3pm call: [phone]

## 2012-07-19 NOTE — Progress Notes (Signed)
[facility]  Subjective:  Asleep, extubated 9 AM today.  Boyfriend at bedside.  He says she lives by herself in [place], but he spends lots of time with her and may be able to help out when she goes home.     Objective: Vital signs in last 24 hours: Blood pressure 140/35, pulse 64, temperature 98.3 F (36.8 C), temperature source Axillary, resp. rate 19, height 5' 6.93" (1.7 m), weight 55 kg (121 lb 4.1 oz), SpO2 100.00%.    PHYSICAL EXAM General--as above Chest--rhonchi Heart--no rub Abd--nontender Extr--no edema, patent RUA AVF  Lab Results:   Recent Labs Lab 07/17/12 1015 07/18/12 0730 07/19/12 0500  NA 135 134* 135  K 4.8 4.6 4.5  CL 92* 91* 94*  CO2 27 26 29   BUN 91* 125* 61*  CREATININE 4.27* 5.33* 2.93*  GLUCOSE 148* 113* 128*  CALCIUM 8.6 8.2* 8.3*  PHOS 4.4 5.2* 3.9     Recent Labs  07/18/12 0730 07/19/12 0500  WBC 18.8* 19.1*  HGB 8.8* 9.3*  HCT 26.9* 29.0*  PLT 394 384     I have reviewed the patient's current medications. Scheduled: . amLODipine  5 mg Per Tube Daily  . antiseptic oral rinse  15 mL Mouth Rinse QID  . aspirin  325 mg Per Tube Daily  . chlorhexidine  15 mL Mouth Rinse BID  . darbepoetin (ARANESP) injection - DIALYSIS  100 mcg Intravenous Q Tue-HD  . dexamethasone  4 mg Intravenous Q8H  . doxercalciferol  2 mcg Intravenous Q T,Th,Sa-HD  . insulin aspart  0-9 Units Subcutaneous Q4H  . metoprolol tartrate  25 mg Per Tube BID  . multivitamin  5 mL Per Tube Daily  . pantoprazole sodium  40 mg Per Tube Q1200  . sodium chloride  3 mL Intravenous Q12H     Assessment/Plan: 1. AMS/status epilepticus- felt due to cefipime toxicity.  today. Off Keppra  Off dilantin 2. VDRF- extubated earlier today.  Weight was 62 kg on 31 May--weight yesterday 55 kg--probably new EDW   3. ESRD, cont HD TTS--currently on HD.  Orders written for tomorrow 4. Pulm edema- resolved 5. HTN/volume- euvolemic, 7 kg below dry wt. Was on  norvasc and atenolol at home, getting norvasc and metoprolol here. Willl decrease norvasc to 5/d and metoprolol to 25 BID and hold for systolic< 120 6. Anemia of ESRD- EPO had been at 2200 at center / Aranesp in hosp 25 qthurs--will increase to 100/week. Hb 8.8 today range. Fe/TIBC 54 %, ferritin 2485--no IV Fe 7. Metabolic Bone Disease- on hectorol, resume binders when taking po if phos elevated (phos 3.9 today)        LOS: 15 days   Christine Graham F 07/19/2012,11:35 AM   .labalb

## 2012-07-19 NOTE — Evaluation (Signed)
Clinical/Bedside Swallow Evaluation Patient Details  Name: Christine Graham MRN: [id number] Date of Birth: Dec 22, 1925  Today's Date: 07/19/2012 Time: 1320-1335l; 4696-2952 SLP Time Calculation (min): 15 min; 6 min  Past Medical History:  Past Medical History  Diagnosis Date  . Hyperlipidemia   . Hypertension   . Gout   . Chronic kidney disease   . Anemia    Past Surgical History:  Past Surgical History  Procedure Laterality Date  . Appendectomy    . Cholecystectomy    . Total hip arthroplasty    . Breast surgery      benign breast biopsy  . Av fistula placement  10/16/2010    (R) UA AVF  . Hip closed reduction  07/21/2011    Procedure: CLOSED REDUCTION HIP;  Surgeon: Toni Arthurs, MD;  Location: [facility] OR;  Service: Orthopedics;  Laterality: Right;   HPI:  79 yo woman, hx ESRD, HTN, hyperlipidemia. Admitted with apparent viral syndrome, leukocytosis, edema pattern on CXR, severe encephalopathy, improving. Treated w empiric abx for HCAP. Since 5/27 progressive MS change to obtundation by am 5/29. EEG with epileptiform pattern. Brain MRI 5/28 with old CVA's, nothing acute.  ETT 5/29 >> 6/07; reintubated 6/7, successfully extubated this am.  Pt requesting coffee   Assessment / Plan / Recommendation Clinical Impression  Pt presents with an acute, likely reversible, dysphagia with vocal cord dysfunction as probable etiology (aphonic, weak cough.)  Pt with multiple signs of pharyngeal dysphagia with risk of aspiration.  Recommend continue NPO for now excluding meds crushed in puree.  SLP will follow next date for PO readiness and/or necessity of instrumental swallow study.  Pt with poor recall of results/rationale.      Aspiration Risk  Severe    Diet Recommendation NPO except meds   Medication Administration: Crushed with puree Postural Changes and/or Swallow Maneuvers: Seated upright 90 degrees    Other  Recommendations Oral Care Recommendations: Oral care QID   Follow Up  Recommendations   (tba)    Frequency and Duration min 2x/week  2 weeks       SLP Swallow Goals Patient will utilize recommended strategies during swallow to increase swallowing safety with: Minimal assistance   Swallow Study Prior Functional Status       General Date of Onset: 07/04/12 HPI: 79 yo woman, hx ESRD, HTN, hyperlipidemia. Admitted with apparent viral syndrome, leukocytosis, edema pattern on CXR. Treated w empiric abx for HCAP. Since 5/27 progressive MS change to obtundation by am 5/29. EEG with epileptiform pattern. Brain MRI 5/28 with old CVA's, nothing acute.  ETT 5/29 >> 6/07; reintubated 6/7, successfully extubated this am.  Pt requesting coffee Type of Study: Bedside swallow evaluation Diet Prior to this Study: NPO Temperature Spikes Noted: No Respiratory Status: Supplemental O2 delivered via (comment) History of Recent Intubation: Yes Date extubated: 07/19/12 Behavior/Cognition: Alert;Confused;Cooperative Oral Cavity - Dentition: Adequate natural dentition Self-Feeding Abilities: Needs assist Patient Positioning: Upright in bed Baseline Vocal Quality: Aphonic Volitional Cough: Weak Volitional Swallow: Able to elicit    Oral/Motor/Sensory Function Overall Oral Motor/Sensory Function: Appears within functional limits for tasks assessed   Ice Chips Ice chips: Impaired Presentation: Spoon Pharyngeal Phase Impairments: Suspected delayed Swallow;Decreased hyoid-laryngeal movement   Thin Liquid Thin Liquid: Impaired Presentation: Spoon Pharyngeal  Phase Impairments: Suspected delayed Swallow;Decreased hyoid-laryngeal movement;Multiple swallows;Change in Vital Signs    Nectar Thick Nectar Thick Liquid: Not tested   Honey Thick Honey Thick Liquid: Not tested   Puree Puree: Impaired Pharyngeal Phase Impairments: Decreased hyoid-laryngeal  movement;Suspected delayed Swallow;Multiple swallows   Solid   GO    Solid: Not tested      Marchelle Folks L. Samson Fred[place], [place]  CCC/SLP Pager [phone]  [name] 07/19/2012,2:12 PM

## 2012-07-19 NOTE — Procedures (Signed)
Extubation Procedure Note  Patient Details:   Name: Christine Graham DOB: 24-Mar-1925 MRN: [id number]   Airway Documentation:     Evaluation  O2 sats: stable throughout Complications: No apparent complications Patient did tolerate procedure well. Bilateral Breath Sounds: Rhonchi Suctioning: Airway Yes  Ok Anis, MA 07/19/2012, 9:20 AM

## 2012-07-20 DIAGNOSIS — M6250 Muscle wasting and atrophy, not elsewhere classified, unspecified site: Secondary | ICD-10-CM

## 2012-07-20 LAB — RENAL FUNCTION PANEL
BUN: 79 mg/dL — ABNORMAL HIGH (ref 6–23)
CO2: 27 mEq/L (ref 19–32)
Chloride: 94 mEq/L — ABNORMAL LOW (ref 96–112)
Creatinine, Ser: 3.97 mg/dL — ABNORMAL HIGH (ref 0.50–1.10)
Glucose, Bld: 119 mg/dL — ABNORMAL HIGH (ref 70–99)
Potassium: 4.5 mEq/L (ref 3.5–5.1)

## 2012-07-20 LAB — GLUCOSE, CAPILLARY: Glucose-Capillary: 90 mg/dL (ref 70–99)

## 2012-07-20 MED ORDER — HEPARIN SODIUM (PORCINE) 1000 UNIT/ML DIALYSIS
20.0000 [IU]/kg | INTRAMUSCULAR | Status: DC | PRN
  Filled 2012-07-20: qty 2

## 2012-07-20 MED ORDER — ALTEPLASE 2 MG IJ SOLR
2.0000 mg | Freq: Once | INTRAMUSCULAR | Status: AC | PRN
  Filled 2012-07-20: qty 2

## 2012-07-20 MED ORDER — LIDOCAINE HCL (PF) 1 % IJ SOLN: 5.0000 mL | INTRAMUSCULAR | Status: DC | PRN

## 2012-07-20 MED ORDER — LIDOCAINE-PRILOCAINE 2.5-2.5 % EX CREA
1.0000 "application " | TOPICAL_CREAM | CUTANEOUS | Status: DC | PRN
  Filled 2012-07-20: qty 5

## 2012-07-20 MED ORDER — SODIUM CHLORIDE 0.9 % IV SOLN: 100.0000 mL | INTRAVENOUS | Status: DC | PRN

## 2012-07-20 MED ORDER — PENTAFLUOROPROP-TETRAFLUOROETH EX AERO: 1.0000 "application " | INHALATION_SPRAY | CUTANEOUS | Status: DC | PRN

## 2012-07-20 MED ORDER — AMLODIPINE BESYLATE 5 MG PO TABS
5.0000 mg | ORAL_TABLET | Freq: Every day | ORAL | Status: DC
  Administered 2012-07-21 (×2): 5 mg via ORAL
  Filled 2012-07-20 (×3): qty 1

## 2012-07-20 MED ORDER — HEPARIN SODIUM (PORCINE) 1000 UNIT/ML DIALYSIS
1000.0000 [IU] | INTRAMUSCULAR | Status: DC | PRN
  Filled 2012-07-20: qty 1

## 2012-07-20 MED ORDER — NEPRO/CARBSTEADY PO LIQD
237.0000 mL | ORAL | Status: DC | PRN
  Filled 2012-07-20: qty 237

## 2012-07-20 MED ORDER — DEXAMETHASONE SODIUM PHOSPHATE 4 MG/ML IJ SOLN
2.0000 mg | Freq: Three times a day (TID) | INTRAMUSCULAR | Status: DC
  Administered 2012-07-20 – 2012-07-21 (×3): 2 mg via INTRAVENOUS
  Filled 2012-07-20 (×6): qty 0.5

## 2012-07-20 MED ORDER — DOXERCALCIFEROL 4 MCG/2ML IV SOLN
INTRAVENOUS | Status: AC
  Filled 2012-07-20: qty 2

## 2012-07-20 NOTE — Progress Notes (Signed)
Speech Language Pathology Dysphagia Treatment Patient Details Name: Christine Graham MRN: [id number] DOB: 09-Jan-1926 Today's Date: 07/20/2012 Time: 1320-1350 SLP Time Calculation (min): 30 min  Assessment / Plan / Recommendation Clinical Impression  Pt has increased strength and endurance today, however, continues to exhibit aphonic voice quality, very weak volitional cough, and minimal laryngeal elevation on palpation.  Continued confusion, and poor recall also evident.  Multiple swallows observed with puree consistency, and Immediate cough response noted with thin liquids. Recommend continuing with NPO status except for meds crushed in puree.  ST to recheck next date for appropriateness of objective study and/or po intake.    Diet Recommendation  Continue with Current Diet: NPO    SLP Plan Continue with current plan of care   Pertinent Vitals/Pain None reported.   Swallowing Goals  SLP Swallowing Goals Patient will utilize recommended strategies during swallow to increase swallowing safety with: Minimal assistance Swallow Study Goal #2 - Progress: Progressing toward goal  General Temperature Spikes Noted: No Respiratory Status: Supplemental O2 delivered via (comment) (2L via [place]) Behavior/Cognition: Alert;Cooperative;Confused Oral Cavity - Dentition: Adequate natural dentition Patient Positioning: Upright in bed  Oral Cavity - Oral Hygiene Does patient have any of the following "at risk" factors?: Oxygen therapy - cannula, mask, simple oxygen devices;Nutritional status - inadequate Brush patient's teeth BID with toothbrush (using toothpaste with fluoride): Yes Patient is HIGH RISK - Oral Care Protocol followed (see row info): Yes Patient is AT RISK - Oral Care Protocol followed (see row info): Yes Patient is mechanically ventilated, follow VAP prevention protocol for oral care: Oral care provided every 4 hours   Dysphagia Treatment Treatment focused on: Upgraded PO texture  trials;Patient/family/caregiver education Treatment Methods/Modalities: Skilled observation Patient observed directly with PO's: Yes Type of PO's observed: Dysphagia 1 (puree);Thin liquids Feeding: Needs assist Liquids provided via: Cup;Straw Pharyngeal Phase Signs & Symptoms: Suspected delayed swallow initiation;Immediate cough;Other (comment);Multiple swallows (poor laryngeal elevation on palpation) Type of cueing: Verbal;Tactile Amount of cueing: Moderate       Christine Graham B. Christine Graham [facility], CCC-SLP [phone]  Christine Graham 07/20/2012, 1:58 PM

## 2012-07-20 NOTE — Progress Notes (Signed)
Report called to charge nurse on 6700.  Patient transferred to 6735 with patient belongings.  No acute changes.  Patient remains stable.  Jacqulynn Cadet

## 2012-07-20 NOTE — Progress Notes (Signed)
Patient transferred to dialysis with dialysis nurse. No complaints. Patient stable.  Will pass on report to oncoming day nurse. Jacqulynn Cadet

## 2012-07-20 NOTE — Progress Notes (Addendum)
PULMONARY  / CRITICAL CARE MEDICINE  Name: Christine Graham MRN: [id number] DOB: Mar 08, 1925    ADMISSION DATE:  07/04/2012 CONSULTATION DATE:  07/06/12  REFERRING MD :  Dr Tat PRIMARY SERVICE: [facility]  CHIEF COMPLAINT:  Altered MS  BRIEF PATIENT DESCRIPTION:  79 yo woman, hx ESRD, HTN, hyperlipidemia. Admitted with apparent viral syndrome, leukocytosis, edema pattern on CXR. Treated w empiric abx for HCAP. Since 5/27 progressive MS change to obtundation by am 5/29. EEG with epileptiform pattern. Brain MRI 5/28 with old CVA's, nothing acute. Received ativan and dilantin, PCCM called to address impending resp failure due to MS.   SIGNIFICANT EVENTS: 6/07 Extubated >> re-intubated for stridor, started decadron 6/10 Dilantin d/c'ed  STUDIES:  5/28 Head CT: Age indeterminate right cerebellar infarction. This could represent an acute infarction. New high density rounded lesion right adjacent to the pons. This could represent the dolichoectasia or aneurysm of the vertebral artery. A meningioma is a secondary consideration. 5/28 Brain MRI 5/28: No acute intracranial abnormality. The right inferior cerebellar infarct is remote. Advanced atrophy and white matter disease. Remote lacunar infarcts of the basal ganglia and left cerebellum. EEG 5/31: consistent with a severe cerebral dysfunction. No definite seizures were recorded during this EEG.  EEG 6/09: mild encephalopathy, no seizure activty.  LINES / TUBES: ETT 5/29 >> 6/07 R [place] CVC 5/29 >>  Rt graft>>> ETT 6/07 >> 6/11  CULTURES: Blood 5/27 >> NEG Blood 5/29 >> NEG Sputum 5/27 >> few candida CSF bacterial 5/29 >> NEG CSF HSV 5/29 >> neg  ANTIBIOTICS: Vanco 5/27 >> 5/30 Cefepime 5/27 >> 5/30 Zosyn 5/28 x1 dose Acyclovir 5/29 >> 5/31  SUBJECTIVE: Seen in dialysis.  Denies chest pain/congestion, or dyspnea.  Feels voice quality is improving.  VITAL SIGNS: Temp:  [97.4 F (36.3 C)-98.4 F (36.9 C)] 97.8 F (36.6 C) (06/12  0655) Pulse Rate:  [55-84] 60 (06/12 0900) Resp:  [16-31] 18 (06/12 0900) BP: (103-190)/(29-66) 103/42 mmHg (06/12 0900) SpO2:  [99 %-100 %] 100 % (06/12 0655) Weight:  [124 lb 9.6 oz (56.518 kg)-126 lb 5.2 oz (57.3 kg)] 126 lb 5.2 oz (57.3 kg) (06/12 0656) 2 liters [place]  INTAKE / OUTPUT:  Intake/Output Summary (Last 24 hours) at 07/20/12 0911 Last data filed at 07/19/12 2000  Gross per 24 hour  Intake     10 ml  Output      0 ml  Net     10 ml     PHYSICAL EXAMINATION: General: No distress Neuro: Alert, follows commands, normal strength HEENT: no sinus tenderness, voice quality improved Cardiovascular: regular Lungs: no wheeze Abdomen: soft, non tender Ext: no edema   LABS: BMET Lab Results  Component Value Date   CREATININE 3.97* 07/19/2012   BUN 79* 07/19/2012   NA 134* 07/19/2012   K 4.5 07/19/2012   CL 94* 07/19/2012   CO2 27 07/19/2012     CBC Lab Results  Component Value Date   WBC 16.9* 07/19/2012   HGB 9.2* 07/19/2012   HCT 27.8* 07/19/2012   MCV 96.9 07/19/2012   PLT 380 07/19/2012   Imaging: Dg Chest Port 1 View  07/19/2012   *RADIOLOGY REPORT*  Clinical Data: Atelectasis.  PORTABLE CHEST - 1 VIEW  Comparison: 07/18/2012.  Findings: Endotracheal tube is in satisfactory position. Nasogastric tube is followed into the stomach.  Right subclavian central line tip projects over the SVC.  Heart size normal. Thoracic aorta is calcified.  Mild bibasilar air space disease with a small left  pleural effusion.  There may be a tiny right pleural effusion as well.  IMPRESSION: Mild bibasilar air space disease and small left pleural effusion. Possible tiny right pleural effusion as well.   Original Report Authenticated By: Leanna Battles, M.D.    CBG (last 3)   Recent Labs  07/19/12 2021 07/19/12 2334 07/20/12 0407  GLUCAP 110* 113* 106*      ASSESSMENT / PLAN:  NEUROLOGIC A:   Severe encephalopathy - probably cefepime induced  >> much improved. Status  epilepticus - resolved >> AED's d/c'ed 6/10. Deconditioning. P:   -monitor clinically -PT/OT  PULMONARY A: Acute Respiratory failure due to altered MS >> resolved. B pleural effusions >> resolved. Post-extubation stridor developed 6/07 >> successfully extubated 6/11. P:   -wean off decadron as tolerated over next few days -f/u CXR as needed  CARDIOVASCULAR A: Shock >> resolved. Hx of HTN. P:  -continue ASA, hydralazine, metoprolol, amlodipine  RENAL A:  ESRD on HD P:   -HD T, Th, Sat per renal   GASTROINTESTINAL A: Nutrition. Dysphagia. P:   -protonix for SUP -NPO except meds >> speech to follow up  HEMATOLOGIC A: anemia of chronic disease. Leukocytosis >> likely from decadron. P:  -f/u CBC intermittently -continue aranesp per renal  INFECTIOUS A:  No evidence for infectious process.  Concern that cefepime contributed to seizures. P:   -Monitor off Abx  ENDOCRINE A: Steroid induced hyperglycemia. P:   -SSI while on decadron  Will have peripheral IV placed, and then d/c central line.  Transfer to telemetry.  Will ask [facility] to assume care from 6/13.  [name], MD [facility] Pulmonary/Critical Care 07/20/2012, 9:11 AM Pager:  [phone] After 3pm call: [phone]    Spoke with pt's son over phone about pt's current status.  Explained that she is improving.  Main issues are swallow evaluation, weaning from decadron, and working with PT/OT.  He expressed concerns about events that transpired earlier in her hospital stay, and concern for information hand off once she is transferred to telemetry.  I explained that [facility] will assume primary care from 6/13, but that PCCM will f/u on 6/13 to ensure adequate transition of care.  [name], MD [facility] Pulmonary/Critical Care 07/20/2012, 9:39 AM Pager:  [phone] After 3pm call: [phone]

## 2012-07-20 NOTE — Progress Notes (Signed)
I have seen and examined this patient and agree with plan as outlined above.  S/p extubation and significantly decreased EDW. Patient was seen on dialysis and the procedure was supervised. BFR 300 Via RUE AVF BP is 108/53.  Patient appears to be tolerating treatment well  Christine Graham A,MD 07/20/2012 8:54 AM

## 2012-07-20 NOTE — Progress Notes (Signed)
[facility] Progress Note  Subjective:   Denies pain or SOB  Objective Filed Vitals:   07/20/12 0655 07/20/12 0656 07/20/12 0700 07/20/12 0730  BP: 159/59 163/52 148/66 170/61  Pulse: 57 60 62 55  Temp: 97.8 F (36.6 C)     TempSrc:      Resp: 18 16 18 16   Height:      Weight:  57.3 kg (126 lb 5.2 oz)    SpO2: 100%      Physical Exam on HD goal 2.8 BP 126/54 P 63 General: comfortable on HD, weak Heart: RRR Lungs: diminished BS Abdomen: soft NT Extremities: no LE edema; SCDs in place Dialysis Access: right I-J Qb 400 and right upper AVF  Outpatient HD: [facility] 3.75hrs EDW 59kg Bath 2K/2.5CA F160 Heparin 2000 RUA AVF Hect 2ug EPO 2200  Assessment/Plan: 1. AMS/status epilepticus- felt due to cefipime toxicity. today. Off Keppra Off dilantin; extubated yesterday 2. ESRD - outpt schedule TTS -  3. Anemia - Hgb stable in 9s o 100 Aranesp - ^ ferritin 4. Secondary hyperparathyroidism - ^ P slightly - had not been on any binders PTA according to consult note by Dr. Eliott Nine, NPO at present but taking meds; on hectorol 2 5. HTN/volume -  EDW lowered BP comes down on HD; CXR yesterday: "Mild bibasilar air space disease with a small left pleural effusion.  There may be a tiny right pleural effusion as well " ; on bid 25 mg metoprolol and norvasc 5 - have changed norvasc to HS dosing 6. Nutrition - NPO - has not eaten since extubation - on multvit  Sheffield Slider, PA-C [facility] Beeper [phone] 07/20/2012,8:06 AM  LOS: 16 days    Additional Objective Labs: Basic Metabolic Panel:  Recent Labs Lab 07/18/12 0730 07/19/12 0500 07/19/12 2330  NA 134* 135 134*  K 4.6 4.5 4.5  CL 91* 94* 94*  CO2 26 29 27   GLUCOSE 113* 128* 119*  BUN 125* 61* 79*  CREATININE 5.33* 2.93* 3.97*  CALCIUM 8.2* 8.3* 8.3*  PHOS 5.2* 3.9 5.7*   Liver Function Tests:  Recent Labs Lab 07/14/12 0350  07/18/12 0730 07/19/12 0500 07/19/12 2330  AST 95*  --   --    --   --   ALT 57*  --   --   --   --   ALKPHOS 101  --   --   --   --   BILITOT 0.2*  --   --   --   --   PROT 7.1  --   --   --   --   ALBUMIN 2.4*  < > 2.5* 2.6* 2.5*  < > = values in this interval not displayed. No results found for this basename: LIPASE, AMYLASE,  in the last 168 hours CBC:  Recent Labs Lab 07/14/12 0350 07/16/12 0500 07/17/12 0210 07/18/12 0730 07/19/12 0500 07/19/12 2330  WBC 17.7* 16.6* 18.3* 18.8* 19.1* 16.9*  NEUTROABS 13.8*  --   --   --   --   --   HGB 9.6* 9.4* 9.5* 8.8* 9.3* 9.2*  HCT 29.9* 29.4* 29.6* 26.9* 29.0* 27.8*  MCV 98.4 100.0 98.7 96.8 98.6 96.9  PLT 279 349 382 394 384 380  CBG:  Recent Labs Lab 07/19/12 1556 07/19/12 1954 07/19/12 2021 07/19/12 2334 07/20/12 0407  GLUCAP 107* 107* 110* 113* 106*   Iron Studies:  Recent Labs  07/17/12 1110  IRON 74  TIBC 136*  FERRITIN 2485*   Studies/Results: Dg Chest Port 1 View  07/19/2012   *RADIOLOGY REPORT*  Clinical Data: Atelectasis.  PORTABLE CHEST - 1 VIEW  Comparison: 07/18/2012.  Findings: Endotracheal tube is in satisfactory position. Nasogastric tube is followed into the stomach.  Right subclavian central line tip projects over the SVC.  Heart size normal. Thoracic aorta is calcified.  Mild bibasilar air space disease with a small left pleural effusion.  There may be a tiny right pleural effusion as well.  IMPRESSION: Mild bibasilar air space disease and small left pleural effusion. Possible tiny right pleural effusion as well.   Original Report Authenticated By: Leanna Battles, M.D.   Medications:   . amLODipine  5 mg Oral Daily  . antiseptic oral rinse  15 mL Mouth Rinse QID  . aspirin EC  325 mg Oral Daily  . chlorhexidine  15 mL Mouth Rinse BID  . darbepoetin (ARANESP) injection - DIALYSIS  100 mcg Intravenous Q Tue-HD  . dexamethasone  4 mg Intravenous Q8H  . doxercalciferol  2 mcg Intravenous Q T,Th,Sa-HD  . insulin aspart  0-9 Units Subcutaneous Q4H  . metoprolol  tartrate  25 mg Oral BID  . multivitamin  1 tablet Oral Daily  . pantoprazole  40 mg Oral Daily  . sodium chloride  3 mL Intravenous Q12H

## 2012-07-20 NOTE — Evaluation (Signed)
Physical Therapy Evaluation Patient Details Name: Christine Graham MRN: [id number] DOB: [birth year] Today's Date: 07/20/2012 Time: 9381-8299 PT Time Calculation (min): 23 min  PT Assessment / Plan / Recommendation Clinical Impression  Patient is an 79 y/o female with pmh ESRD, HTN, HLD and AOCD has been intubated extended period and presents with severe weakness, imbalance ,decreased activity tolerance affecting independence with mobility.  She will benefit from skilled PT in the acute setting to maximize independence and allow return home following SNF stay.    PT Assessment  Patient needs continued PT services    Follow Up Recommendations  SNF       Barriers to Discharge Decreased caregiver support      Equipment Recommendations  None recommended by PT       Frequency Min 3X/week    Precautions / Restrictions Precautions Precautions: Fall Precaution Comments: NPO   Pertinent Vitals/Pain No pain complaints      Mobility  Bed Mobility Bed Mobility: Supine to Sit;Sitting - Scoot to Edge of Bed Supine to Sit: HOB elevated;2: Max assist Sitting - Scoot to [place] of Bed: 1: +1 Total assist Details for Bed Mobility Assistance: assisted feet off edge of bed and lifted trunk  Transfers Sit to Stand: From bed;1: +2 Total assist Sit to Stand: Patient Percentage: 40% Stand to Sit: To chair/3-in-1;1: +2 Total assist Stand to Sit: Patient Percentage: 40% Details for Transfer Assistance: able to lift hips with assist for ant weight shift and lifting help Ambulation/Gait Ambulation/Gait Assistance: 1: +2 Total assist Ambulation/Gait: Patient Percentage: 50% Ambulation Distance (Feet): 2 Feet Assistive device: 2 person hand held assist Ambulation/Gait Assistance Details: took pivotal steps to chair Gait Pattern: Trunk flexed;Decreased stride length;Shuffle    Exercises General Exercises - Lower Extremity Long Arc Quad: AROM;Both;5 reps;Seated Hip Flexion/Marching: AROM;5  reps;Both;Seated Toe Raises: AROM;5 reps;Both;Seated   PT Diagnosis: Generalized weakness  PT Problem List: Decreased strength;Decreased activity tolerance;Decreased mobility;Decreased balance;Decreased cognition PT Treatment Interventions: DME instruction;Gait training;Functional mobility training;Patient/family education;Therapeutic activities;Therapeutic exercise;Balance training   PT Goals Acute Rehab PT Goals Time For Goal Achievement: 08-22-12 Potential to Achieve Goals: Good Pt will go Supine/Side to Sit: with min assist PT Goal: Supine/Side to Sit - Progress: Goal set today Pt will go Sit to Supine/Side: with min assist PT Goal: Sit to Supine/Side - Progress: Goal set today Pt will Transfer Bed to Chair/Chair to Bed: with min assist PT Transfer Goal: Bed to Chair/Chair to Bed - Progress: Goal set today Pt will Stand: with min assist;1 - 2 min;with bilateral upper extremity support PT Goal: Stand - Progress: Goal set today Pt will Ambulate: 16 - 50 feet;with min assist;with rolling walker PT Goal: Ambulate - Progress: Goal set today  Visit Information  Last PT Received On: 07/20/12 Assistance Needed: +2    Subjective Data  Subjective: I want a hot dog Patient Stated Goal: to get stronger   Prior Functioning  Home Living Lives With: Alone Available Help at Discharge: Family;Available PRN/intermittently Type of Home: House Home Access: Stairs to enter [employer] of Steps: 2 Entrance Stairs-Rails: None Home Layout: One level Bathroom Shower/Tub: Tub/shower unit;Curtain Home Adaptive Equipment: Bedside commode/3-in-1;Walker - rolling;Shower chair with back Prior Function Level of Independence: Independent Able to Take Stairs?: Yes Driving: Yes Vocation: Retired Musician: HOH;Expressive difficulties (weak voice)    Cognition  Cognition Arousal/Alertness: Awake/alert Behavior During Therapy: WFL for tasks assessed/performed Overall  Cognitive Status: Impaired/Different from baseline Area of Impairment: Orientation;Memory;Safety/judgement Orientation Level: Disoriented to;Time;Situation Memory: Decreased short-term  memory Safety/Judgement: Decreased awareness of safety    Extremity/Trunk Assessment Right Upper Extremity Assessment RUE ROM/Strength/Tone: Deficits RUE ROM/Strength/Tone Deficits: AAROM WFL, strength shoulder flexion 2/5, elbow flexion 3/5, extension 2+/5 RUE Sensation: WFL - Light Touch Left Upper Extremity Assessment LUE ROM/Strength/Tone: Deficits LUE ROM/Strength/Tone Deficits: AAROM WFL strength shoulder flexion 3/5, elbow flexion 3+/5, extension 3+/5 LUE Sensation: WFL - Light Touch Right Lower Extremity Assessment RLE ROM/Strength/Tone: Deficits RLE ROM/Strength/Tone Deficits: AAROM WFL, strength hip flexion 2+/5, knee extension 3+/5, flexion 3/5, ankle dorsiflexion 3-/5 Left Lower Extremity Assessment LLE ROM/Strength/Tone: Deficits LLE ROM/Strength/Tone Deficits: AAROM WFL, strength hip flexion 2+/5, knee extension 3+/5, flexion 3/5, ankle dorsiflexion 3-/5 LLE Sensation: WFL - Light Touch Trunk Assessment Trunk Assessment: Kyphotic   Balance Static Sitting Balance Static Sitting - Balance Support: Feet supported;Bilateral upper extremity supported Static Sitting - Level of Assistance: 3: Mod assist Static Sitting - Comment/# of Minutes: sat edge of bed arpx 4 minutes with right lateral and posterior lean at times needing max assist to correct, sometimes supervision with cues for anterior and left weight shift (but only maintains few seconds.)    End of Session PT - End of Session Equipment Utilized During Treatment: Gait belt Activity Tolerance: Patient limited by fatigue Patient left: in chair;with call bell/phone within reach Nurse Communication: Mobility status  GP     [facility] 07/20/2012, 3:12 PM Len[place], PT [phone] 07/20/2012

## 2012-07-20 NOTE — Progress Notes (Signed)
Speech Therapy Cancellation Note  Pt currently unavailable due to HD.  Will continue efforts to determine po readiness. Montgomery Favor B. Murvin Natal [facility], CCC-SLP [phone]

## 2012-07-21 DIAGNOSIS — R197 Diarrhea, unspecified: Secondary | ICD-10-CM | POA: Diagnosis present

## 2012-07-21 LAB — RENAL FUNCTION PANEL
Albumin: 2.7 g/dL — ABNORMAL LOW (ref 3.5–5.2)
Chloride: 96 mEq/L (ref 96–112)
Creatinine, Ser: 3.13 mg/dL — ABNORMAL HIGH (ref 0.50–1.10)
GFR calc Af Amer: 15 mL/min — ABNORMAL LOW (ref >90)
GFR calc non Af Amer: 13 mL/min — ABNORMAL LOW (ref >90)
Potassium: 4.3 mEq/L (ref 3.5–5.1)
Sodium: 137 mEq/L (ref 135–145)

## 2012-07-21 LAB — CBC
MCH: 31.9 pg (ref 26.0–34.0)
MCHC: 32.2 g/dL (ref 30.0–36.0)
Platelets: 415 10*3/uL — ABNORMAL HIGH (ref 150–400)
RBC: 3.2 MIL/uL — ABNORMAL LOW (ref 3.87–5.11)

## 2012-07-21 LAB — OCCULT BLOOD X 1 CARD TO LAB, STOOL: Fecal Occult Bld: NEGATIVE

## 2012-07-21 LAB — GLUCOSE, CAPILLARY: Glucose-Capillary: 100 mg/dL — ABNORMAL HIGH (ref 70–99)

## 2012-07-21 MED ORDER — PRO-STAT SUGAR FREE PO LIQD
30.0000 mL | Freq: Two times a day (BID) | ORAL | Status: DC
  Administered 2012-07-21 – 2012-08-02 (×24): 30 mL via ORAL
  Filled 2012-07-21 (×25): qty 30

## 2012-07-21 MED ORDER — RESOURCE THICKENUP CLEAR PO POWD
ORAL | Status: DC | PRN
  Administered 2012-07-21: 19:00:00 via ORAL
  Administered 2012-07-27: 1 g via ORAL
  Filled 2012-07-21 (×2): qty 125

## 2012-07-21 NOTE — Evaluation (Signed)
Occupational Therapy Evaluation Patient Details Name: Christine Graham MRN: [id number] DOB: 1926/02/09 Today's Date: 07/21/2012 Time: 2440-1027 OT Time Calculation (min): 27 min  OT Assessment / Plan / Recommendation Clinical Impression  This 79 yo female admitted SOB, cough and found to have apparent viral syndrome, leukocytosis, edema pattern on CXR. Treated w empiric abx for HCAP. Since 5/27 progressive MS change to obtundation by am 5/29. EEG with epileptiform pattern. Brain MRI 5/28 with old CVA's ( lacunar infarcts of basal ganglia and left cerebellum), nothing acute. Received ativan and dilantin. Impending respiratory failure. Intubated 5/29 extubated , extubated 6/7 then re-intubated due to stridor. Extubated 07/20/11 presents to acute OT with problems below. Will benefit from acute OT with follow up at SNF.     OT Assessment  Patient needs continued OT Services    Follow Up Recommendations  SNF    Barriers to Discharge Decreased caregiver support    Equipment Recommendations   (TBD next venue)       Frequency  Min 2X/week    Precautions / Restrictions Precautions Precautions: Fall Precaution Comments: NPO Restrictions Weight Bearing Restrictions: No       ADL  Eating/Feeding: NPO Grooming: Performed;Wash/dry face;Set up Where Assessed - Grooming: Supported sitting Upper Body Bathing: Simulated;Minimal assistance Where Assessed - Upper Body Bathing: Supported sitting Lower Body Bathing: Simulated;Maximal assistance Where Assessed - Lower Body Bathing: Supported sit to stand Upper Body Dressing: Simulated;Maximal assistance Where Assessed - Upper Body Dressing: Supported sitting Lower Body Dressing: Simulated;+1 Total assistance Where Assessed - Lower Body Dressing: Supported sit to Pharmacist, hospital: Chief of Staff: Patient Percentage: 40% Statistician Method: Sit to Barista:  (Bed>recliner 2 feet  away) Toileting - Architect and Hygiene: Simulated;+1 Total assistance Where Assessed - Engineer, mining and Hygiene: Standing Equipment Used: Rolling walker;Gait belt Transfers/Ambulation Related to ADLs: total A +2 (pt=40%) sit to stand (20%) stand to sit--decrease control and not reaching back; (40%) ambulation    OT Diagnosis: Generalized weakness;Cognitive deficits;Altered mental status  OT Problem List: Decreased strength;Decreased activity tolerance;Decreased safety awareness;Impaired balance (sitting and/or standing);Decreased knowledge of use of DME or AE;Decreased cognition OT Treatment Interventions: Self-care/ADL training;Balance training;Patient/family education;DME and/or AE instruction;Cognitive remediation/compensation;Therapeutic activities;Therapeutic exercise   OT Goals Acute Rehab OT Goals OT Goal Formulation: With patient Time For Goal Achievement: 08/04/12 Potential to Achieve Goals: Good ADL Goals Pt Will Perform Grooming: with set-up;with supervision;Sitting, chair;Supported (2 tasks without cues) ADL Goal: Grooming - Progress: Goal set today Pt Will Perform Upper Body Bathing: with set-up;with supervision;Supported;Sitting, chair ADL Goal: Upper Body Bathing - Progress: Goal set today Pt Will Perform Lower Body Bathing: with mod assist;Supported;Sit to stand from chair ADL Goal: Lower Body Bathing - Progress: Goal set today Pt Will Transfer to Toilet: with mod assist;Ambulation;3-in-1 ADL Goal: Toilet Transfer - Progress: Goal set today Arm Goals Pt Will Perform AROM: with supervision, verbal cues required/provided;1 set;10 reps;Bilateral upper extremities (to increase strength and maintain range) Arm Goal: AROM - Progress: Goal set today Miscellaneous OT Goals Miscellaneous OT Goal #1: Pt will be min A  to come up to EOB with HOB flat and no rail for BADLs. Miscellaneous OT Goal #2: Pt will consistently follow one step commnands OT  Goal: Miscellaneous Goal #2 - Progress: Goal set today  Visit Information  Last OT Received On: 07/21/12 Assistance Needed: +2 PT/OT Co-Evaluation/Treatment: Yes    Subjective Data  Subjective: "Good" (when I told her OT and PT were here to  get up on her feet and out of bed)   Prior Functioning     Home Living Lives With: Alone Available Help at Discharge: Family;Available PRN/intermittently Type of Home: House Home Access: Stairs to enter [employer] of Steps: 2 Entrance Stairs-Rails: None Home Layout: One level Bathroom Shower/Tub: Tub/shower unit;Curtain Home Adaptive Equipment: Bedside commode/3-in-1;Walker - rolling;Shower chair with back Prior Function Level of Independence: Independent (did not use any of her DME) Able to Take Stairs?: Yes Driving: Yes Vocation: Retired Musician: HOH (talks in a low tone due to intubation) Dominant Hand: Left         Vision/Perception Vision - History Patient Visual Report: No change from baseline   Cognition  Cognition Arousal/Alertness: Awake/alert Behavior During Therapy: Impulsive Overall Cognitive Status: Impaired/Different from baseline Area of Impairment: Safety/judgement Orientation Level: Disoriented to;Time;Situation Memory: Decreased short-term memory Following Commands: Follows one step commands inconsistently;Follows one step commands with increased time Safety/Judgement: Decreased awareness of safety (impulsive at times)    Extremity/Trunk Assessment Right Upper Extremity Assessment RUE ROM/Strength/Tone: Deficits RUE ROM/Strength/Tone Deficits: AAROM WFL, strength shoulder flexion 2/5 Left Upper Extremity Assessment LUE ROM/Strength/Tone: Deficits LUE ROM/Strength/Tone Deficits: AAROM WFL strength shoulder flexion 2+/5     Mobility Bed Mobility Bed Mobility: Supine to Sit;Sitting - Scoot to Edge of Bed Rolling Right: With rail Supine to Sit: 3: Mod assist;HOB  elevated;With rails Sitting - Scoot to Edge of Bed: 4: Min assist;With rail Details for Bed Mobility Assistance: cues to slow down for safety and to sit up tall Transfers Transfers: Sit to Stand;Stand to Sit Sit to Stand: 1: +2 Total assist;With upper extremity assist;From bed Sit to Stand: Patient Percentage: 40% Stand to Sit: 1: +2 Total assist;With upper extremity assist;To chair/3-in-1 Stand to Sit: Patient Percentage: 20% Details for Transfer Assistance: Pt c/o weakness in legs.  She needs cues to push up with arms and extend hips and trunk in standing.  Decreased endurance     Exercise Other Exercises Other Exercises: Daughter educated by PT on exercises pt could do with her legs while seated and by me for bring her trunk forward away from recliner and trying to hold this in an upright posture as long as she can   Balance Static Sitting Balance Static Sitting - Balance Support: Bilateral upper extremity supported;Feet supported Static Sitting - Level of Assistance:  (min guard A at EOB) Static Sitting - Comment/# of Minutes: repeated attempts at sitting unsupported in chair with emaphasis on increaseing anterior pelvic tilt and trunk extension.   Pt able to initiate change in posture but can only hold a few seconds.  Instructed daughter to help pt with this thoughout the weekend Static Standing Balance Static Standing - Balance Support: Bilateral upper extremity supported Static Standing - Level of Assistance: 1: +2 Total assist;3: Mod assist Static Standing - Comment/# of Minutes: Started off with total A +2 (pt=40%) then to Mod A for only about 5-10 seconds with constant cues to stand up tall (squeeze butt cheeks) (x3)   End of Session OT - End of Session Equipment Utilized During Treatment: Gait belt Activity Tolerance: Patient limited by fatigue Patient left: in chair;with call bell/phone within reach;with family/visitor present    Evette Georges [phone] 07/21/2012,  10:15 AM

## 2012-07-21 NOTE — Clinical Social Work Note (Signed)
CSW returned to patient's room after 6 pm and was able to talk with patient, now awake and very alert, eating dinner and her daughter, Katy Fitch [phone]). CSW explained patient's need for short-term rehab, gave SNF list for [facility] and explained SNF search process. Daughter very agreeable as was patient. CSW informed daughter and patient that they will be given facility responses so that they can choose a facility. Daughter was advised to call if any questions.   Genelle Bal, MSW, LCSW [phone]

## 2012-07-21 NOTE — Progress Notes (Signed)
[facility] PROGRESS NOTE  Christine Graham ZOX:[id number] DOB: 1925/10/24 DOA: 07/04/2012 PCP: Judie Petit, MD  Chart reviewed. D/w Dr. Craige Cotta  Assessment/Plan:  Active Problems:   Diarrhea   HYPERTENSION   ESRD on dialysis   Acute encephalopathy   HYPERLIPIDEMIA   GOUT   Acute diastolic CHF (congestive heart failure)   Acute respiratory failure   Status epilepticus   Muscular deconditioning   Postextubation stridor   Other dysphagia  Check stool for C diff. Tolerating D2 with thickened liquids  Code Status: full Family Communication: boyfriend Disposition Plan: SNF likely next week  Consultants:  Nephrology  CCM signed off  neurohospitalist  Procedures: ETT 5/29 >> 6/07  R [place] CVC 5/29 >> 6/12  Rt graft>>>  ETT 6/07 >> 6/11  Antibiotics: Vanco 5/27 >> 5/30  Cefepime 5/27 >> 5/30  Zosyn 5/28 x1 dose  Acyclovir 5/29 >> 5/31  HPI/Subjective: No complaints. Per boyfriend, tolerating modified diet. Denies n/v. No abd pain. No dyspnea  Objective: Filed Vitals:   07/20/12 2117 07/21/12 0358 07/21/12 1019 07/21/12 1352  BP: 177/53 143/39 165/43 174/51  Pulse: 66 56 56 62  Temp: 98 F (36.7 C) 97.7 F (36.5 C) 97.8 F (36.6 C) 98 F (36.7 C)  TempSrc: Oral Axillary Oral Oral  Resp: 22 18 18 18   Height:      Weight: 55.101 kg (121 lb 7.6 oz)     SpO2: 100% 100% 97% 94%    Intake/Output Summary (Last 24 hours) at 07/21/12 1441 Last data filed at 07/20/12 2128  Gross per 24 hour  Intake    240 ml  Output      0 ml  Net    240 ml   Filed Weights   07/20/12 0656 07/20/12 1030 07/20/12 2117  Weight: 57.3 kg (126 lb 5.2 oz) 55.1 kg (121 lb 7.6 oz) 55.101 kg (121 lb 7.6 oz)    Exam:   General:  In chair. Large amount of loose brown stool on floor. Weak appearing. Slow to respond, but appropriate  Cardiovascular: RRR without MGR  Respiratory: CTA without WRR  Abdomen: Soft, NT, ND  Ext:  No CCE  Data Reviewed: Basic Metabolic  Panel:  Recent Labs Lab 07/17/12 1015 07/18/12 0730 07/19/12 0500 07/19/12 2330 07/21/12 0540  NA 135 134* 135 134* 137  K 4.8 4.6 4.5 4.5 4.3  CL 92* 91* 94* 94* 96  CO2 27 26 29 27 27   GLUCOSE 148* 113* 128* 119* 98  BUN 91* 125* 61* 79* 43*  CREATININE 4.27* 5.33* 2.93* 3.97* 3.13*  CALCIUM 8.6 8.2* 8.3* 8.3* 8.4  PHOS 4.4 5.2* 3.9 5.7* 5.3*   Liver Function Tests:  Recent Labs Lab 07/17/12 1015 07/18/12 0730 07/19/12 0500 07/19/12 2330 07/21/12 0540  ALBUMIN 2.5* 2.5* 2.6* 2.5* 2.7*   No results found for this basename: LIPASE, AMYLASE,  in the last 168 hours No results found for this basename: AMMONIA,  in the last 168 hours CBC:  Recent Labs Lab 07/17/12 0210 07/18/12 0730 07/19/12 0500 07/19/12 2330 07/21/12 0540  WBC 18.3* 18.8* 19.1* 16.9* 13.9*  HGB 9.5* 8.8* 9.3* 9.2* 10.2*  HCT 29.6* 26.9* 29.0* 27.8* 31.7*  MCV 98.7 96.8 98.6 96.9 99.1  PLT 382 394 384 380 415*   Cardiac Enzymes: No results found for this basename: CKTOTAL, CKMB, CKMBINDEX, TROPONINI,  in the last 168 hours BNP (last 3 results)  Recent Labs  07/04/12 0724  PROBNP 45870.0*   CBG:  Recent Labs Lab  07/20/12 1528 07/20/12 1945 07/20/12 2353 07/21/12 0356 07/21/12 0824  GLUCAP 101* 90 88 93 100*    No results found for this or any previous visit (from the past 240 hour(s)).   Studies: No results found.  Scheduled Meds: . amLODipine  5 mg Oral QHS  . antiseptic oral rinse  15 mL Mouth Rinse QID  . aspirin EC  325 mg Oral Daily  . chlorhexidine  15 mL Mouth Rinse BID  . darbepoetin (ARANESP) injection - DIALYSIS  100 mcg Intravenous Q Tue-HD  . doxercalciferol  2 mcg Intravenous Q T,Th,Sa-HD  . feeding supplement  30 mL Oral BID  . metoprolol tartrate  25 mg Oral BID  . multivitamin  1 tablet Oral Daily  . sodium chloride  3 mL Intravenous Q12H   Continuous Infusions:   Time spent: 35 minutes  Kyrstan Gotwalt L  [facility] Pager [phone]. If  7PM-7AM, please contact night-coverage at [email or web address], password [facility] 07/21/2012, 2:41 PM  LOS: 17 days

## 2012-07-21 NOTE — Progress Notes (Signed)
Received  And  Reoriented  To  6700.alert and  Oriented  To  Self , dis oriented  To  Time  And  Situation. Will  Move to  Camera  Room  For  Closer  Observation.

## 2012-07-21 NOTE — Clinical Social Work Psychosocial (Signed)
Clinical Social Work Department BRIEF PSYCHOSOCIAL ASSESSMENT 07/21/2012  Patient:  Christine Graham, Christine Graham     Account Number:  [id number]     Admit date:  07/04/2012  Clinical Social Worker:  Delmer Islam  Date/Time:  07/21/2012 06:10 AM  Referred by:  Physician  Date Referred:  07/21/2012 Referred for  SNF Placement   Other Referral:   Interview type:  Patient Other interview type:   CSW also talked with patient's friend Christine Graham.    PSYCHOSOCIAL DATA Living Status:  ALONE Admitted from facility:   Level of care:   Primary support name:  Christine Graham Primary support relationship to patient:  CHILD, ADULT Degree of support available:   Patient has 2 children: Christine Graham and son Christine Graham. Per Mr. Murray Hodgkins, daughter visits daily and son 2/3 times a week.    CURRENT CONCERNS Current Concerns  Post-Acute Placement   Other Concerns:    SOCIAL WORK ASSESSMENT / PLAN CSW talked with patient regarding recommendation of ST rehab to regain strength before returning home. Mr. Murray Hodgkins stated that she lives very near patient and is with her every day. Per friend, patient is on dialysis and had been driving herself to her treatments.    Patient explained PT's recommendation and the benefits of getting rehab prior to going home. Patient was awake at the beginning of our conversation, but appeared very lethargic and drowsy.  She closed her eyes several times during the conversation as if she was asleep. Patient was unable to talk but did mouth her responses and stated no when asked if she would be agreeable to ST rehab. Patient's friend felt that he could care for her at home, and CSW informed him that patient is total care and explained what that would entail in terms of care. Near end of conversation, patient was asleep so CSW advised friend that CSW would talk with patient another time and hopefully with a family member present.   Assessment/plan status:  Psychosocial  Support/Ongoing Assessment of Needs Other assessment/ plan:   Information/referral to community resources:    PATIENT'S/FAMILY'S RESPONSE TO PLAN OF CARE: Patient was very lethargic and drowsy during our conversation, but did indicate that she did not want to go to rehab. CSW will talk with patient again when she is more alert and contact family if needed regarding ST rehab.

## 2012-07-21 NOTE — Progress Notes (Signed)
Patient  Remains  Confused, thinks  shes at   [facility]  And  Lives  With  Her  Mother..  Patient  Reoriented  To  Situation and  time

## 2012-07-21 NOTE — Progress Notes (Addendum)
PULMONARY  / CRITICAL CARE MEDICINE  Name: Christine Graham MRN: [id number] DOB: 08/11/1925    ADMISSION DATE:  07/04/2012 CONSULTATION DATE:  07/06/12  REFERRING MD :  Dr Tat PRIMARY SERVICE: [facility]  CHIEF COMPLAINT:  Altered MS  BRIEF PATIENT DESCRIPTION:  79 yo woman, hx ESRD, HTN, hyperlipidemia. Admitted with apparent viral syndrome, leukocytosis, edema pattern on CXR. Treated w empiric abx for HCAP. Since 5/27 progressive MS change to obtundation by am 5/29. EEG with epileptiform pattern. Brain MRI 5/28 with old CVA's, nothing acute. Received ativan and dilantin, PCCM called to address impending resp failure due to MS.   SIGNIFICANT EVENTS: 6/07 Extubated >> re-intubated for stridor, started decadron 6/10 Dilantin d/c'ed 6/12 Transfer to telemetry  STUDIES:  5/28 Head CT: Age indeterminate right cerebellar infarction. This could represent an acute infarction. New high density rounded lesion right adjacent to the pons. This could represent the dolichoectasia or aneurysm of the vertebral artery. A meningioma is a secondary consideration. 5/28 Brain MRI 5/28: No acute intracranial abnormality. The right inferior cerebellar infarct is remote. Advanced atrophy and white matter disease. Remote lacunar infarcts of the basal ganglia and left cerebellum. EEG 5/31: consistent with a severe cerebral dysfunction. No definite seizures were recorded during this EEG.  EEG 6/09: mild encephalopathy, no seizure activty.  LINES / TUBES: ETT 5/29 >> 6/07 R [place] CVC 5/29 >> 6/12 Rt graft>>> ETT 6/07 >> 6/11  CULTURES: Blood 5/27 >> NEG Blood 5/29 >> NEG Sputum 5/27 >> few candida CSF bacterial 5/29 >> NEG CSF HSV 5/29 >> neg  ANTIBIOTICS: Vanco 5/27 >> 5/30 Cefepime 5/27 >> 5/30 Zosyn 5/28 x1 dose Acyclovir 5/29 >> 5/31  SUBJECTIVE: Feels better.  Episodes of confusion overnight.  More alert this AM.  Family feels her voice is getting stronger.  She feels hungry.  VITAL SIGNS: Temp:  [97.7  F (36.5 C)-99.1 F (37.3 C)] 97.7 F (36.5 C) (06/13 0358) Pulse Rate:  [56-74] 56 (06/13 0358) Resp:  [16-23] 18 (06/13 0358) BP: (95-177)/(33-53) 143/39 mmHg (06/13 0358) SpO2:  [99 %-100 %] 100 % (06/13 0358) Weight:  [121 lb 7.6 oz (55.1 kg)-121 lb 7.6 oz (55.101 kg)] 121 lb 7.6 oz (55.101 kg) (06/12 2117) 2 liters [place]  INTAKE / OUTPUT:  Intake/Output Summary (Last 24 hours) at 07/21/12 4098 Last data filed at 07/20/12 2128  Gross per 24 hour  Intake    270 ml  Output   2400 ml  Net  -2130 ml     PHYSICAL EXAMINATION: General: No distress, sitting in chair Neuro: Alert, follows commands, normal strength HEENT: no sinus tenderness, voice quality improved Cardiovascular: regular Lungs: no wheeze Abdomen: soft, non tender Ext: no edema   LABS: BMET Lab Results  Component Value Date   CREATININE 3.13* 07/21/2012   BUN 43* 07/21/2012   NA 137 07/21/2012   K 4.3 07/21/2012   CL 96 07/21/2012   CO2 27 07/21/2012     CBC Lab Results  Component Value Date   WBC 13.9* 07/21/2012   HGB 10.2* 07/21/2012   HCT 31.7* 07/21/2012   MCV 99.1 07/21/2012   PLT 415* 07/21/2012   CBG (last 3)   Recent Labs  07/20/12 2353 07/21/12 0356 07/21/12 0824  GLUCAP 88 93 100*      ASSESSMENT / PLAN:  NEUROLOGIC A:   Severe encephalopathy - probably cefepime induced  >> much improved. Status epilepticus - resolved >> AED's d/c'ed 6/10. Deconditioning. Confusion >> ? If related to decadron. P:   -  monitor clinically -PT/OT >> recommending SNF -monitor mental status off decadron  PULMONARY A: Acute Respiratory failure due to altered MS >> resolved. B pleural effusions >> resolved. Post-extubation stridor developed 6/07 >> successfully extubated 6/11. P:   -d/c decadron 6/13 -f/u CXR as needed  CARDIOVASCULAR A: Shock >> resolved. Hx of HTN. P:  -continue ASA, hydralazine, metoprolol, amlodipine  RENAL A:  ESRD on HD P:   -HD T, Th, Sat per renal    GASTROINTESTINAL A: Nutrition. Dysphagia. P:   -protonix for SUP >> can likely d/c soon -NPO except meds >> speech to follow up  HEMATOLOGIC A: anemia of chronic disease. Leukocytosis >> likely from decadron. P:  -f/u CBC intermittently -continue aranesp per renal  INFECTIOUS A:  No evidence for infectious process.  Concern that cefepime contributed to seizures. P:   -Monitor off Abx  ENDOCRINE A: Steroid induced hyperglycemia. P:   -d/c SSI once off decadron  Clinically improving.  No active PCCM issues >> will sign off.  Please call if additional help needed.  Updated family at bedside about plan and transition of care to hospitalist team now that she is out of ICU.  [name], MD [facility] Pulmonary/Critical Care 07/21/2012, 9:42 AM Pager:  [phone] After 3pm call: [phone]

## 2012-07-21 NOTE — Progress Notes (Signed)
Subjective:  No complaints, comfortable in bed  Objective: Vital signs in last 24 hours: Temp:  [97.7 F (36.5 C)-99.1 F (37.3 C)] 97.7 F (36.5 C) (06/13 0358) Pulse Rate:  [56-74] 56 (06/13 0358) Resp:  [16-23] 18 (06/13 0358) BP: (95-177)/(33-53) 143/39 mmHg (06/13 0358) SpO2:  [99 %-100 %] 100 % (06/13 0358) Weight:  [55.1 kg (121 lb 7.6 oz)-55.101 kg (121 lb 7.6 oz)] 55.101 kg (121 lb 7.6 oz) (06/12 2117) Weight change: -1.418 kg (-3 lb 2 oz)  Intake/Output from previous day: 06/12 0701 - 06/13 0700 In: 270 [P.O.:240; I.V.:30] Out: 2400    EXAM: General appearance:  Alert, in no apparent distress Resp:  CTA without rales, rhonchi, or wheezes Cardio:  RRR with Gr I/VI murmur, no rub GI: + BS, soft and nontender Extremities:  No edema Access:  Right IJ catheter, AVF @ RUA  Lab Results:  Recent Labs  07/19/12 2330 07/21/12 0540  WBC 16.9* 13.9*  HGB 9.2* 10.2*  HCT 27.8* 31.7*  PLT 380 415*   BMET:  Recent Labs  07/19/12 2330 07/21/12 0540  NA 134* 137  K 4.5 4.3  CL 94* 96  CO2 27 27  GLUCOSE 119* 98  BUN 79* 43*  CREATININE 3.97* 3.13*  CALCIUM 8.3* 8.4  ALBUMIN 2.5* 2.7*   No results found for this basename: PTH,  in the last 72 hours Iron Studies: No results found for this basename: IRON, TIBC, TRANSFERRIN, FERRITIN,  in the last 72 hours  Outpatient HD: [facility] TTS 3.75hrs EDW 59kg Bath 2K/2.5CA F160 Heparin 2000 RUA AVF Hect 2ug EPO 2200  Assessment/Plan: 1. AMS/status epilepticus - secondary to Cefepime toxicity, off Keppra & Dilantin, extubated 6/11. 2. ESRD - HD on TTS @ [facility], K 4.3 today.  Next HD tomorrow. 3. HTN/Volume - BP 143/39 on Amlodipine 5 mg qhs, Metoprolol 25 mg bid; wt 55.1 kg s/p net UF 2.4 L yesterday, EDW 59 kg.  4. Anemia - Hgb 10.2, Fe sat 54% with ferritin 2485; on Aranesp 100 mcg on Tues. 5. Secondary hyperparathyroidism - Ca 8.4 (9.4 corrected), P 5.3; Hectorol 2 mcg, no current binders. 6. Nutrition - Alb 2.7, poor PO intake  since intubation, multivitamin.   LOS: 17 days   LYLES,CHARLES 07/21/2012,8:58 AM   I have seen and examined this patient and agree with plan as outlined by Gerome Apley, PA-C. Legacy Lacivita A,MD 07/24/2012 6:33 PM

## 2012-07-21 NOTE — Progress Notes (Signed)
Speech Language Pathology Dysphagia Treatment Patient Details Name: Christine Graham MRN: [id number] DOB: 17-Nov-1925 Today's Date: 07/21/2012 Time: 7829-5621 SLP Time Calculation (min): 50 min  Assessment / Plan / Recommendation Clinical Impression  Pt continues to improve gradually.  Voice quality has not yet returned to normal, but appears stronger than yesterday.  Volitional cough is stronger and more productive.  Laryngeal elevation is palpable today, and appears to be adequate.  Pt does not exhibit overt s/s aspiration with any consistency tested, however, she is very impulsive, and takes large bites and sips at a rapid rate without one on one supervision with verbal and sometimes tactile cues. Her endurance appears to be improving, however, she  will still benefit from frequent rest breaks during meals.  Will begin dys 2 diet with nectar thick liquids, primarily for energy conservation and safety due to impulsivity.  Ice chips or small individual sips of water are permissible, but only with strict supervision and adherence to posted safe swallow precautions.  Anticipate successfully advancing diet as pt continues to improve. ST to follow for diet tolerance and appropriateness for advanced diet/liquids.    Diet Recommendation  Initiate / Change Diet: Dysphagia 2 (fine chop);Nectar-thick liquid    SLP Plan Goals updated   Pertinent Vitals/Pain No pain reported   Swallowing Goals  SLP Swallowing Goals Patient will consume recommended diet without observed clinical signs of aspiration with: Supervision/safety Swallow Study Goal #1 - Progress: Progressing toward goal Patient will utilize recommended strategies during swallow to increase swallowing safety with: Supervision/safety;Minimal assistance Swallow Study Goal #2 - Progress: Progressing toward goal  General Temperature Spikes Noted: No Respiratory Status: Room air Behavior/Cognition: Alert;Cooperative;Confused;Distractible;Requires  cueing;Decreased sustained attention Oral Cavity - Dentition: Adequate natural dentition Patient Positioning: Upright in chair  Oral Cavity - Oral Hygiene Does patient have any of the following "at risk" factors?: None of the above Brush patient's teeth BID with toothbrush (using toothpaste with fluoride): Yes   Dysphagia Treatment Treatment focused on: Skilled observation of diet tolerance;Upgraded PO texture trials;Patient/family/caregiver education;Utilization of compensatory strategies Family/Caregiver Educated: Daughter Treatment Methods/Modalities: Skilled observation Patient observed directly with PO's: Yes Type of PO's observed: Dysphagia 3 (soft);Dysphagia 1 (puree);Thin liquids Feeding: Able to feed self;Needs assist;Needs set up Liquids provided via: Cup;Straw Type of cueing: Verbal;Tactile Amount of cueing: Moderate   Arn Mcomber B. Murvin Natal [facility], CCC-SLP [phone]     [name] 07/21/2012, 11:53 AM

## 2012-07-21 NOTE — Progress Notes (Signed)
Physical Therapy Treatment Patient Details Name: Christine Graham MRN: [id number] DOB: August 26, 1925 Today's Date: 07/21/2012 Time: 4098-1191 PT Time Calculation (min): 30 min  PT Assessment / Plan / Recommendation Comments on Treatment Session  Pt is making some improvement in mobility, but continues to be limited by decreased endurance and impulsivity.  Continue to recomment PT at SNF at discharge    Follow Up Recommendations  SNF     Does the patient have the potential to tolerate intense rehabilitation     Barriers to Discharge        Equipment Recommendations  None recommended by PT    Recommendations for Other Services    Frequency Min 3X/week   Plan      Precautions / Restrictions Precautions Precautions: Fall Precaution Comments: NPO Restrictions Weight Bearing Restrictions: No   Pertinent Vitals/Pain No c/o pain    Mobility  Bed Mobility Bed Mobility: Supine to Sit;Sitting - Scoot to Edge of Bed Rolling Right: With rail Supine to Sit: 3: Mod assist;HOB elevated;With rails Sitting - Scoot to Edge of Bed: 4: Min assist;With rail Details for Bed Mobility Assistance: cues to slow down for safety and to sit up tall Transfers Transfers: Sit to Stand;Stand to Sit Sit to Stand: 1: +2 Total assist;With upper extremity assist;From bed Sit to Stand: Patient Percentage: 40% Stand to Sit: 1: +2 Total assist;With upper extremity assist;To chair/3-in-1 Stand to Sit: Patient Percentage: 20% Details for Transfer Assistance: Pt c/o weakness in legs.  She needs cues to push up with arms and extend hips and trunk in standing.  Decreased endurance Ambulation/Gait Ambulation/Gait Assistance: 1: +2 Total assist Ambulation/Gait: Patient Percentage: 50% Ambulation Distance (Feet): 2 Feet Assistive device: Rolling walker Ambulation/Gait Assistance Details: steps to chair with assist needed for trunk support Gait Pattern: Trunk flexed;Shuffle;Decreased stride length Gait velocity:  decreased General Gait Details:  Pt impulsive with getting to chair and has decreased activity endurance.  Stairs: No Wheelchair Mobility Wheelchair Mobility: No    Exercises General Exercises - Lower Extremity Ankle Circles/Pumps: AROM;Both;5 reps;Seated Gluteal Sets: AROM;Both;5 reps;Standing Long Arc Quad: AROM;Both;5 reps;Seated Hip Flexion/Marching: AROM;5 reps;Both;Seated Other Exercises Other Exercises: Daughter educated by PT on exercises pt could do with her legs while seated and by OT for bring her trunk forward away from recliner and trying to hold this in an upright posture as long as she can   PT Diagnosis:    PT Problem List:   PT Treatment Interventions:     PT Goals Acute Rehab PT Goals Time For Goal Achievement: 2012-08-29 Potential to Achieve Goals: Good Pt will go Supine/Side to Sit: with min assist PT Goal: Supine/Side to Sit - Progress: Progressing toward goal Pt will go Sit to Supine/Side: with min assist Pt will Transfer Bed to Chair/Chair to Bed: with min assist PT Transfer Goal: Bed to Chair/Chair to Bed - Progress: Progressing toward goal Pt will Stand: with min assist;1 - 2 min;with bilateral upper extremity support PT Goal: Stand - Progress: Progressing toward goal Pt will Ambulate: 16 - 50 feet;with min assist;with rolling walker PT Goal: Ambulate - Progress: Progressing toward goal  Visit Information  Last PT Received On: 07/21/12 Assistance Needed: +2 PT/OT Co-Evaluation/Treatment: Yes    Subjective Data  Subjective: "I'm hungry" Patient Stated Goal: to get stronger   Cognition  Cognition Arousal/Alertness: Awake/alert Behavior During Therapy: Impulsive Overall Cognitive Status: Impaired/Different from baseline Area of Impairment: Safety/judgement Orientation Level: Disoriented to;Time;Situation Memory: Decreased short-term memory Following Commands: Follows one step commands inconsistently;Follows  one step commands with increased  time Safety/Judgement: Decreased awareness of safety (impulsive at times)    Balance  Static Sitting Balance Static Sitting - Balance Support: Bilateral upper extremity supported;Feet supported Static Sitting - Level of Assistance:  (min guard A at EOB) Static Sitting - Comment/# of Minutes: repeated attempts at sitting unsupported in chair with emaphasis on increaseing anterior pelvic tilt and trunk extension.   Pt able to initiate change in posture but can only hold a few seconds. OT Instructed daughter to help pt with this thoughout the weekend Static Standing Balance Static Standing - Balance Support: Bilateral upper extremity supported Static Standing - Level of Assistance: 1: +2 Total assist;3: Mod assist Static Standing - Comment/# of Minutes: Started off with total A +2 (pt=40%) then to Mod A for only about 5-10 seconds with constant cues to stand up tall (squeeze butt cheeks) (x3)  End of Session PT - End of Session Activity Tolerance: Patient limited by fatigue Patient left: in chair;with call bell/phone within reach;with family/visitor present Nurse Communication: Mobility status   GP    Christine Graham, [place] [phone] 07/21/2012, 10:21 AM

## 2012-07-21 NOTE — Progress Notes (Signed)
NUTRITION FOLLOW UP  Intervention:   1. Add 30 ml Prostat po BID, each supplement provides 100 kcal and 15 grams protein; please mix in applesauce or other puree to obtain appropriate consistency. 2. RD to continue to follow nutrition care plan.  New Nutrition Dx:   Swallowing difficulty r/t dysphagia and recent prolonged intubation AEB need for dysphagia diet and ST.  Goal:   Pt to meet >/= 90% of their estimated nutrition needs; not met.   Monitor:   PO intake, supplement tolerance, weight trend, labs  Assessment:   Per neurology pt likely with cefepime related neurotoxicity. Extubated 6/11.  Initial BSE completed by SLP 6/11, recommending NPO. Pt seen by SLP this morning and now on Dysphagia 2 diet with Nectar Thickened Liquids. Pt reports that she is hungry and ready to eat. Pt has yet to receive meal tray.  Per renal, pt now significantly below her EDW of 59 kg.  Pt is at nutrition risk given acute/chronic medical issues, advanced age, and weight loss.  Height: Ht Readings from Last 1 Encounters:  07/06/12 5' 6.93" (1.7 m)    Weight Status:   Wt Readings from Last 1 Encounters:  07/20/12 121 lb 7.6 oz (55.101 kg)  Post HD weight 123 lb 5/27; wt trending down with HD and inadequate oral intake  Re-estimated needs:  Kcal: 1500 - 1700 Protein: 66 - 80 grams Fluid: 1.2 L/day  Skin: stage I pressure ulcer to back  Diet Order: Dysphagia 2; Nectar Liquids   Intake/Output Summary (Last 24 hours) at 07/21/12 1139 Last data filed at 07/20/12 2128  Gross per 24 hour  Intake    240 ml  Output      0 ml  Net    240 ml    Last BM: 6/11   Labs:   Recent Labs Lab 07/19/12 0500 07/19/12 2330 07/21/12 0540  NA 135 134* 137  K 4.5 4.5 4.3  CL 94* 94* 96  CO2 29 27 27   BUN 61* 79* 43*  CREATININE 2.93* 3.97* 3.13*  CALCIUM 8.3* 8.3* 8.4  PHOS 3.9 5.7* 5.3*  GLUCOSE 128* 119* 98    CBG (last 3)   Recent Labs  07/20/12 2353 07/21/12 0356 07/21/12 0824   GLUCAP 88 93 100*   No results found for this basename: HGBA1C   Scheduled Meds: . amLODipine  5 mg Oral QHS  . antiseptic oral rinse  15 mL Mouth Rinse QID  . aspirin EC  325 mg Oral Daily  . chlorhexidine  15 mL Mouth Rinse BID  . darbepoetin (ARANESP) injection - DIALYSIS  100 mcg Intravenous Q Tue-HD  . doxercalciferol  2 mcg Intravenous Q T,Th,Sa-HD  . metoprolol tartrate  25 mg Oral BID  . multivitamin  1 tablet Oral Daily  . pantoprazole  40 mg Oral Daily  . sodium chloride  3 mL Intravenous Q12H    Continuous Infusions:   Jarold Motto MS, RD, LDN Pager: [phone] After-hours pager: [phone]

## 2012-07-22 DIAGNOSIS — R1319 Other dysphagia: Secondary | ICD-10-CM | POA: Diagnosis not present

## 2012-07-22 LAB — RENAL FUNCTION PANEL
CO2: 23 mEq/L (ref 19–32)
GFR calc Af Amer: 9 mL/min — ABNORMAL LOW (ref >90)
GFR calc non Af Amer: 8 mL/min — ABNORMAL LOW (ref >90)
Glucose, Bld: 115 mg/dL — ABNORMAL HIGH (ref 70–99)
Potassium: 3.6 mEq/L (ref 3.5–5.1)
Sodium: 133 mEq/L — ABNORMAL LOW (ref 135–145)

## 2012-07-22 LAB — CBC
HCT: 32.9 % — ABNORMAL LOW (ref 36.0–46.0)
Hemoglobin: 11.1 g/dL — ABNORMAL LOW (ref 12.0–15.0)
MCV: 96.5 fL (ref 78.0–100.0)
RBC: 3.41 MIL/uL — ABNORMAL LOW (ref 3.87–5.11)
WBC: 19.7 10*3/uL — ABNORMAL HIGH (ref 4.0–10.5)

## 2012-07-22 LAB — GLUCOSE, CAPILLARY: Glucose-Capillary: 127 mg/dL — ABNORMAL HIGH (ref 70–99)

## 2012-07-22 MED ORDER — LIDOCAINE HCL (PF) 1 % IJ SOLN: 5.0000 mL | INTRAMUSCULAR | Status: DC | PRN

## 2012-07-22 MED ORDER — LIDOCAINE-PRILOCAINE 2.5-2.5 % EX CREA: 1.0000 "application " | TOPICAL_CREAM | CUTANEOUS | Status: DC | PRN

## 2012-07-22 MED ORDER — PENTAFLUOROPROP-TETRAFLUOROETH EX AERO: 1.0000 "application " | INHALATION_SPRAY | CUTANEOUS | Status: DC | PRN

## 2012-07-22 MED ORDER — HEPARIN SODIUM (PORCINE) 1000 UNIT/ML DIALYSIS
20.0000 [IU]/kg | INTRAMUSCULAR | Status: DC | PRN
  Filled 2012-07-22: qty 2

## 2012-07-22 MED ORDER — AMLODIPINE BESYLATE 10 MG PO TABS
10.0000 mg | ORAL_TABLET | Freq: Every day | ORAL | Status: DC
  Administered 2012-07-22 – 2012-07-25 (×4): 10 mg via ORAL
  Filled 2012-07-22 (×5): qty 1

## 2012-07-22 MED ORDER — ALTEPLASE 2 MG IJ SOLR: 2.0000 mg | Freq: Once | INTRAMUSCULAR | Status: DC | PRN

## 2012-07-22 MED ORDER — SODIUM CHLORIDE 0.9 % IV SOLN: 100.0000 mL | INTRAVENOUS | Status: DC | PRN

## 2012-07-22 MED ORDER — DOXERCALCIFEROL 4 MCG/2ML IV SOLN
INTRAVENOUS | Status: AC
  Administered 2012-07-22: 2 ug via INTRAVENOUS
  Filled 2012-07-22: qty 2

## 2012-07-22 MED ORDER — HEPARIN SODIUM (PORCINE) 1000 UNIT/ML DIALYSIS
1000.0000 [IU] | INTRAMUSCULAR | Status: DC | PRN
  Filled 2012-07-22: qty 1

## 2012-07-22 MED ORDER — NEPRO/CARBSTEADY PO LIQD: 237.0000 mL | ORAL | Status: DC | PRN

## 2012-07-22 NOTE — Progress Notes (Signed)
Active bed search in place for SNF level of care. Currently no bed offers for patient. Fl2 placed on shadow chart for MD's signature.  Lorri Frederick. [place]  [phone]

## 2012-07-22 NOTE — Progress Notes (Signed)
[facility] PROGRESS NOTE  Christine Graham GNF:[id number] DOB: 1925/10/13 DOA: 07/04/2012 PCP: Judie Petit, MD  Chart reviewed. D/w Dr. Craige Cotta  Assessment/Plan:  Active Problems:   Diarrhea   HYPERTENSION   ESRD on dialysis   Acute encephalopathy   HYPERLIPIDEMIA   GOUT   Acute diastolic CHF (congestive heart failure)   Acute respiratory failure   Status epilepticus   Muscular deconditioning   Postextubation stridor   Other dysphagia  Diarrhea resolved. c diff negative. To SNF soon. More alert and interactive today  Code Status: full Family Communication: boyfriend Disposition Plan: SNF likely next week  Consultants:  Nephrology  CCM signed off  neurohospitalist  Procedures: ETT 5/29 >> 6/07  R [place] CVC 5/29 >> 6/12  Rt graft>>>  ETT 6/07 >> 6/11  Antibiotics: Vanco 5/27 >> 5/30  Cefepime 5/27 >> 5/30  Zosyn 5/28 x1 dose  Acyclovir 5/29 >> 5/31  HPI/Subjective: No complaints. No dyspnea. No further diarrhea  Objective: Filed Vitals:   07/22/12 1018 07/22/12 1042 07/22/12 1257 07/22/12 1610  BP: 167/62 186/66 155/50 158/45  Pulse: 72 71 70 67  Temp:  98.8 F (37.1 C) 98.6 F (37 C) 99.2 F (37.3 C)  TempSrc: Oral     Resp: 18 19 18 18   Height:      Weight:      SpO2: 100% 95% 98% 99%    Intake/Output Summary (Last 24 hours) at 07/22/12 1715 Last data filed at 07/22/12 1258  Gross per 24 hour  Intake    160 ml  Output    526 ml  Net   -366 ml   Filed Weights   07/20/12 2117 07/21/12 2200 07/22/12 0630  Weight: 55.101 kg (121 lb 7.6 oz) 55.2 kg (121 lb 11.1 oz) 55.1 kg (121 lb 7.6 oz)    Exam:   General:  Alert. More interactive  Cardiovascular: RRR without MGR  Respiratory: CTA without WRR  Abdomen: Soft, NT, ND  Ext:  No CCE  Data Reviewed: Basic Metabolic Panel:  Recent Labs Lab 07/18/12 0730 07/19/12 0500 07/19/12 2330 07/21/12 0540 07/22/12 0622  NA 134* 135 134* 137 133*  K 4.6 4.5 4.5 4.3 3.6  CL 91*  94* 94* 96 93*  CO2 26 29 27 27 23   GLUCOSE 113* 128* 119* 98 115*  BUN 125* 61* 79* 43* 72*  CREATININE 5.33* 2.93* 3.97* 3.13* 4.64*  CALCIUM 8.2* 8.3* 8.3* 8.4 8.1*  PHOS 5.2* 3.9 5.7* 5.3* 4.3   Liver Function Tests:  Recent Labs Lab 07/18/12 0730 07/19/12 0500 07/19/12 2330 07/21/12 0540 07/22/12 0622  ALBUMIN 2.5* 2.6* 2.5* 2.7* 2.7*   No results found for this basename: LIPASE, AMYLASE,  in the last 168 hours No results found for this basename: AMMONIA,  in the last 168 hours CBC:  Recent Labs Lab 07/18/12 0730 07/19/12 0500 07/19/12 2330 07/21/12 0540 07/22/12 0622  WBC 18.8* 19.1* 16.9* 13.9* 19.7*  HGB 8.8* 9.3* 9.2* 10.2* 11.1*  HCT 26.9* 29.0* 27.8* 31.7* 32.9*  MCV 96.8 98.6 96.9 99.1 96.5  PLT 394 384 380 415* 448*   Cardiac Enzymes: No results found for this basename: CKTOTAL, CKMB, CKMBINDEX, TROPONINI,  in the last 168 hours BNP (last 3 results)  Recent Labs  07/04/12 0724  PROBNP 45870.0*   CBG:  Recent Labs Lab 07/20/12 1945 07/20/12 2353 07/21/12 0356 07/21/12 0824 07/22/12 0908  GLUCAP 90 88 93 100* 127*    Recent Results (from the past 240 hour(s))  CLOSTRIDIUM  DIFFICILE BY PCR     Status: None   Collection Time    07/21/12  3:03 PM      Result Value Range Status   C difficile by pcr NEGATIVE  NEGATIVE Final     Studies: No results found.  Scheduled Meds: . amLODipine  10 mg Oral QHS  . antiseptic oral rinse  15 mL Mouth Rinse QID  . aspirin EC  325 mg Oral Daily  . chlorhexidine  15 mL Mouth Rinse BID  . darbepoetin (ARANESP) injection - DIALYSIS  100 mcg Intravenous Q Tue-HD  . doxercalciferol  2 mcg Intravenous Q T,Th,Sa-HD  . feeding supplement  30 mL Oral BID  . metoprolol tartrate  25 mg Oral BID  . multivitamin  1 tablet Oral Daily  . sodium chloride  3 mL Intravenous Q12H   Continuous Infusions:   Time spent: 15 minutes  Jermiya Reichl L  [facility] Pager [phone]. If 7PM-7AM, please contact  night-coverage at [email or web address], password [facility] 07/22/2012, 5:15 PM  LOS: 18 days

## 2012-07-22 NOTE — Progress Notes (Signed)
Speech Language Pathology Dysphagia Treatment Patient Details Name: Christine Graham MRN: [id number] DOB: 04-10-1925 Today's Date: 07/22/2012 Time: 8295-6213 SLP Time Calculation (min): 35 min  Assessment / Plan / Recommendation Clinical Impression  Family now present, pt awake and alert. Requesting water.  Daughter reports fatigue today, with subsequent poor po intake.  She reports good intake yesterday following BSE, without overt s/s aspiration. Pt was given sips of water, and required verbal and tactile cues to limit sip size. Immediate reflexive cough elicited following thin liquid sips 70% of the time.  No cough response with ice chips, or with very small individual boluses. Recommend continuing with dys 2 diet and nectar thick liquids.  Pt may have ice chips 1 at a time at slow rate when alert and seated upright. Pt voice continues to be hoarse, likely due to extended intubation.  Objective study is recommended prior to diet advancement.  FEES would allow visualization of vocal folds, as well as appropriateness for advanced consistencies.    Diet Recommendation  Continue with Current Diet: Dysphagia 2 (fine chop);Nectar-thick liquid (ice chips one at a time for oral pleasure.)    SLP Plan Continue with current plan of care   Pertinent Vitals/Pain No pain reported   Swallowing Goals  SLP Swallowing Goals Swallow Study Goal #1 - Progress: Progressing toward goal Swallow Study Goal #2 - Progress: Progressing toward goal  General Temperature Spikes Noted: No Respiratory Status: Supplemental O2 delivered via (comment) (via [place] @ 2L) Behavior/Cognition: Alert;Cooperative;Pleasant mood;Impulsive (impulsive with liquids) Oral Cavity - Dentition: Adequate natural dentition Patient Positioning: Upright in bed  Oral Cavity - Oral Hygiene Does patient have any of the following "at risk" factors?: Oxygen therapy - cannula, mask, simple oxygen devices Brush patient's teeth BID with toothbrush  (using toothpaste with fluoride): Yes Patient is AT RISK - Oral Care Protocol followed (see row info): Yes   Dysphagia Treatment Treatment focused on: Skilled observation of diet tolerance;Patient/family/caregiver education;Upgraded PO texture trials Family/Caregiver Educated: daughter, granddaughter, friend Treatment Methods/Modalities: Skilled observation Patient observed directly with PO's: Yes Type of PO's observed: Thin liquids;Ice chips Feeding: Needs assist (impulsive with thin liquids) Liquids provided via: Cup;Teaspoon Pharyngeal Phase Signs & Symptoms: Suspected delayed swallow initiation;Immediate cough (cough noted after sips of thin. No cough after ice chips) Type of cueing: Verbal;Tactile Amount of cueing: Moderate  [name] B. Murvin Natal [facility], CCC-SLP [phone]     [name] 07/22/2012, 4:18 PM

## 2012-07-22 NOTE — Progress Notes (Signed)
Hemodialysis- Unable to UF patient today d/t bp drops x2. Pt came in with high bp 190-210/60s. Drop to 108/50 and pt became unresponsive. Saline bolus given (300cc) and pt became alert. Gerome Apley PAC at bedside during event. Dr. Arrie Aran rounded on pt shortly after event as well. Only able to pull 0.5L total. System clotted with 42 minutes remaining. All blood able to be given back. Ok to DC treatment with time remaining d/t events during tx per nephrology. Pt transported back to 6700 in stable condition. CMT notified of bradycardia with bp drop. Continue to monitor.

## 2012-07-22 NOTE — Progress Notes (Signed)
Subjective:   Seen on dialysis, no complaints, eating better  Objective: Vital signs in last 24 hours: Temp:  [97.8 F (36.6 C)-98.8 F (37.1 C)] 98.8 F (37.1 C) (06/14 0630) Pulse Rate:  [56-75] 73 (06/14 0700) Resp:  [18-19] 19 (06/14 0630) BP: (165-226)/(43-94) 226/94 mmHg (06/14 0700) SpO2:  [93 %-97 %] 94 % (06/14 0630) Weight:  [55.1 kg (121 lb 7.6 oz)-55.2 kg (121 lb 11.1 oz)] 55.1 kg (121 lb 7.6 oz) (06/14 0630) Weight change: 0.1 kg (3.5 oz)     EXAM: General appearance:  Alert, in no apparent distress Resp:  CTA without rales, rhonchi, or wheezes Cardio:  RRR with Gr I/VI murmur, no rub GI: + BS, soft and nontender Extremities:  No edema Access:  AVF @ RUA with BFR 400 cc/min  Lab Results:  Recent Labs  07/21/12 0540 07/22/12 0622  WBC 13.9* 19.7*  HGB 10.2* 11.1*  HCT 31.7* 32.9*  PLT 415* 448*   BMET:  Recent Labs  07/19/12 2330 07/21/12 0540  NA 134* 137  K 4.5 4.3  CL 94* 96  CO2 27 27  GLUCOSE 119* 98  BUN 79* 43*  CREATININE 3.97* 3.13*  CALCIUM 8.3* 8.4  ALBUMIN 2.5* 2.7*   No results found for this basename: PTH,  in the last 72 hours Iron Studies: No results found for this basename: IRON, TIBC, TRANSFERRIN, FERRITIN,  in the last 72 hours  Outpatient HD: [facility] TTS 3.75hrs EDW 59kg Bath 2K/2.5CA F160 Heparin 2000 RUA AVF Hect 2ug EPO 2200  Assessment/Plan: 1. AMS/status epilepticus - secondary to Cefepime toxicity, off Keppra & Dilantin, extubated 6/11; mental status now at baseline.  2. ESRD - HD on TTS @ [facility], K 4.3 yesterday.  HD today.  3. HTN/Volume - BP 184/48 on Amlodipine 5 mg qhs, Metoprolol 25 mg bid; wt 55.1 kg pre-HD with UF goal of 2.5 L, EDW 59 kg.  4. Anemia - Hgb 11.1, Fe sat 54% with ferritin 2485; on Aranesp 100 mcg on Tues.  5. Secondary hyperparathyroidism - Ca 8.4 (9.4 corrected), P 5.3; Hectorol 2 mcg, no current binders.  6. Nutrition - Alb 2.7, poor PO intake since intubation, multivitamin.    LOS: 18 days    Donnie Panik 07/22/2012,7:26 AM

## 2012-07-22 NOTE — Progress Notes (Signed)
Speech Language Pathology Dysphagia Treatment Patient Details Name: MAUDINE KLUESNER MRN: [id number] DOB: 10/13/1925 Today's Date: 07/22/2012 Time:  -     Assessment / Plan / Recommendation Clinical Impression  Pt has just returned from HD, and is currently insufficiently arousable for po trial.  No family is present at this time. Spoke with nurse tech, who reports pt in HD all morning, so po intake/tolerance was not observed. ST to continue to follow for diet tolerance.  Continue to recommend strict adherence to posted precautions, and provide po intake only if pt is alert, awake, and upright.      Diet Recommendation  Continue with Current Diet: Dysphagia 2 (fine chop);Nectar-thick liquid (when awake, alert, and upright)    SLP Plan Continue with current plan of care   Pertinent Vitals/Pain None indicated      General Temperature Spikes Noted: No Respiratory Status: Room air Behavior/Cognition: Lethargic   Dysphagia Treatment Patient observed directly with PO's: No Reason PO's not observed: Lethargic   Vegas Coffin B. Murvin Natal [facility], CCC-SLP [phone]     [name] 07/22/2012, 10:55 AM

## 2012-07-22 NOTE — Progress Notes (Signed)
I have seen and examined this patient and agree with plan as outlined by Gerome Apley, PA-C.  Noted to have markedly elevated BP with sudden drop on HD.  Will increase amlodipine to 10mg  qhs. Clark Clowdus A,MD 07/22/2012 9:48 AM

## 2012-07-23 NOTE — Progress Notes (Signed)
[facility] PROGRESS NOTE  Christine Graham ZOX:[id number] DOB: Oct 19, 1925 DOA: 07/04/2012 PCP: Judie Petit, MD  Chart reviewed. D/w Dr. Craige Cotta  Assessment/Plan:  Active Problems:   Diarrhea   HYPERTENSION   ESRD on dialysis   Acute encephalopathy   HYPERLIPIDEMIA   GOUT   Acute diastolic CHF (congestive heart failure)   Acute respiratory failure   Status epilepticus   Muscular deconditioning   Postextubation stridor   Other dysphagia  Diarrhea resolved. c diff negative. To SNF soon. More alert and interactive today. Speech to reevaluate. May be able to advance diet  Code Status: full Family Communication: boyfriend Disposition Plan: SNF likely next week  Consultants:  Nephrology  CCM signed off  neurohospitalist  Procedures: ETT 5/29 >> 6/07  R [place] CVC 5/29 >> 6/12  Rt graft>>>  ETT 6/07 >> 6/11  Antibiotics: Vanco 5/27 >> 5/30  Cefepime 5/27 >> 5/30  Zosyn 5/28 x1 dose  Acyclovir 5/29 >> 5/31  HPI/Subjective: No complaints. No dyspnea. No further diarrhea  Objective: Filed Vitals:   07/22/12 1610 07/22/12 2100 07/23/12 0453 07/23/12 0900  BP: 158/45 185/47 125/54 162/55  Pulse: 67 74 64 68  Temp: 99.2 F (37.3 C) 99.1 F (37.3 C) 98.9 F (37.2 C) 98.9 F (37.2 C)  TempSrc:      Resp: 18 18 18 19   Height:      Weight:      SpO2: 99% 96% 100% 100%    Intake/Output Summary (Last 24 hours) at 07/23/12 1224 Last data filed at 07/23/12 0930  Gross per 24 hour  Intake    740 ml  Output      0 ml  Net    740 ml   Filed Weights   07/20/12 2117 07/21/12 2200 07/22/12 0630  Weight: 55.101 kg (121 lb 7.6 oz) 55.2 kg (121 lb 11.1 oz) 55.1 kg (121 lb 7.6 oz)    Exam:   General:  Alert. Oriented. Voice louder today  Cardiovascular: RRR without MGR  Respiratory: CTA without WRR  Abdomen: Soft, NT, ND  Ext:  No CCE  Data Reviewed: Basic Metabolic Panel:  Recent Labs Lab 07/18/12 0730 07/19/12 0500 07/19/12 2330  07/21/12 0540 07/22/12 0622  NA 134* 135 134* 137 133*  K 4.6 4.5 4.5 4.3 3.6  CL 91* 94* 94* 96 93*  CO2 26 29 27 27 23   GLUCOSE 113* 128* 119* 98 115*  BUN 125* 61* 79* 43* 72*  CREATININE 5.33* 2.93* 3.97* 3.13* 4.64*  CALCIUM 8.2* 8.3* 8.3* 8.4 8.1*  PHOS 5.2* 3.9 5.7* 5.3* 4.3   Liver Function Tests:  Recent Labs Lab 07/18/12 0730 07/19/12 0500 07/19/12 2330 07/21/12 0540 07/22/12 0622  ALBUMIN 2.5* 2.6* 2.5* 2.7* 2.7*   No results found for this basename: LIPASE, AMYLASE,  in the last 168 hours No results found for this basename: AMMONIA,  in the last 168 hours CBC:  Recent Labs Lab 07/18/12 0730 07/19/12 0500 07/19/12 2330 07/21/12 0540 07/22/12 0622  WBC 18.8* 19.1* 16.9* 13.9* 19.7*  HGB 8.8* 9.3* 9.2* 10.2* 11.1*  HCT 26.9* 29.0* 27.8* 31.7* 32.9*  MCV 96.8 98.6 96.9 99.1 96.5  PLT 394 384 380 415* 448*   Cardiac Enzymes: No results found for this basename: CKTOTAL, CKMB, CKMBINDEX, TROPONINI,  in the last 168 hours BNP (last 3 results)  Recent Labs  07/04/12 0724  PROBNP 45870.0*   CBG:  Recent Labs Lab 07/20/12 1945 07/20/12 2353 07/21/12 0356 07/21/12 0824 07/22/12 0908  GLUCAP  90 88 93 100* 127*    Recent Results (from the past 240 hour(s))  CLOSTRIDIUM DIFFICILE BY PCR     Status: None   Collection Time    07/21/12  3:03 PM      Result Value Range Status   C difficile by pcr NEGATIVE  NEGATIVE Final     Studies: No results found.  Scheduled Meds: . amLODipine  10 mg Oral QHS  . antiseptic oral rinse  15 mL Mouth Rinse QID  . aspirin EC  325 mg Oral Daily  . chlorhexidine  15 mL Mouth Rinse BID  . darbepoetin (ARANESP) injection - DIALYSIS  100 mcg Intravenous Q Tue-HD  . doxercalciferol  2 mcg Intravenous Q T,Th,Sa-HD  . feeding supplement  30 mL Oral BID  . metoprolol tartrate  25 mg Oral BID  . multivitamin  1 tablet Oral Daily  . sodium chloride  3 mL Intravenous Q12H   Continuous Infusions:   Time spent: 15  minutes  Kellye Mizner L  [facility] Pager [phone]. If 7PM-7AM, please contact night-coverage at [email or web address], password [facility] 07/23/2012, 12:24 PM  LOS: 19 days

## 2012-07-23 NOTE — Progress Notes (Signed)
Subjective:   Comfortable in bed, still speaking very softly (since extubation), but no complaints  Objective: Vital signs in last 24 hours: Temp:  [98.6 F (37 C)-99.2 F (37.3 C)] 98.9 F (37.2 C) (06/15 0453) Pulse Rate:  [49-74] 64 (06/15 0453) Resp:  [18-20] 18 (06/15 0453) BP: (108-226)/(44-94) 125/54 mmHg (06/15 0453) SpO2:  [95 %-100 %] 100 % (06/15 0453) Weight change:   Intake/Output from previous day: 06/14 0701 - 06/15 0700 In: 360 [P.O.:360] Out: 526    EXAM: General appearance:  Alert, in no apparent distress Resp:  Poor effort, but mostly clear Cardio:  RRR with Gr I/VI systolic murmur, no rub GI:  + BS, soft and nontender Extremities:  No edema Access: AVF @ RUA with + bruit  Lab Results:  Recent Labs  07/21/12 0540 07/22/12 0622  WBC 13.9* 19.7*  HGB 10.2* 11.1*  HCT 31.7* 32.9*  PLT 415* 448*   BMET:  Recent Labs  07/21/12 0540 07/22/12 0622  NA 137 133*  K 4.3 3.6  CL 96 93*  CO2 27 23  GLUCOSE 98 115*  BUN 43* 72*  CREATININE 3.13* 4.64*  CALCIUM 8.4 8.1*  ALBUMIN 2.7* 2.7*   No results found for this basename: PTH,  in the last 72 hours Iron Studies: No results found for this basename: IRON, TIBC, TRANSFERRIN, FERRITIN,  in the last 72 hours  [facility]patient HD: [facility] 3.75hrs EDW 59kg Bath 2K/2.5CA F160 Heparin 2000 RUA AVF Hect 2ug EPO 2200  Assessment/Plan: 1. AMS/status epilepticus - secondary to Cefepime toxicity, off Keppra & Dilantin, extubated 6/11; mental status now at baseline.  2. ESRD - HD on TTS @ [facility], K 3.6 pre-HD yesterday. Next HD on 6/17.  3. HTN/Volume - BP 125/54 on Amlodipine 5 mg qhs, Metoprolol 25 mg bid; wt 55.1 kg pre-HD yesterday with UF goal of 2.5 L, but net UF of only 526 ml secondary to sudden drop in BP, EDW 59 kg.  4. Anemia - Hgb 11.1, Fe sat 54% with ferritin 2485; on Aranesp 100 mcg on Tues.  5. Secondary hyperparathyroidism - Ca 8.1 (9.1 corrected), P 4.3; Hectorol 2 mcg, no current binders.   6. Nutrition - Alb 2.7, poor PO intake since intubation, multivitamin.      LOS: 19 days   LYLES,CHARLES 07/23/2012,6:53 AM   I have seen and examined this patient and agree with plan as outlined by Gerome Apley, PA-C.  New EDW will be 55kg (was 59kg as an outpt).  Feels like she can swallow liquids again and would like some water without the thickin.  Will ask to repeat swallowing study as she is unable to swallow her tablets. Keller Mikels A,MD 07/23/2012 10:23 AM

## 2012-07-24 MED ORDER — ACETAMINOPHEN 325 MG PO TABS
650.0000 mg | ORAL_TABLET | Freq: Four times a day (QID) | ORAL | Status: DC | PRN
  Administered 2012-07-24 – 2012-07-25 (×3): 650 mg via ORAL
  Filled 2012-07-24 (×3): qty 2

## 2012-07-24 MED ORDER — TRAMADOL HCL 50 MG PO TABS: 50.0000 mg | ORAL_TABLET | Freq: Once | ORAL | Status: DC

## 2012-07-24 MED ORDER — DEXAMETHASONE SODIUM PHOSPHATE 4 MG/ML IJ SOLN
4.0000 mg | Freq: Four times a day (QID) | INTRAMUSCULAR | Status: DC
  Filled 2012-07-24 (×4): qty 1

## 2012-07-24 MED ORDER — LIDOCAINE VISCOUS 2 % MT SOLN
15.0000 mL | Freq: Once | OROMUCOSAL | Status: AC
  Administered 2012-07-24: 15 mL via OROMUCOSAL
  Filled 2012-07-24: qty 15

## 2012-07-24 MED ORDER — TRAMADOL HCL 50 MG PO TABS
50.0000 mg | ORAL_TABLET | Freq: Once | ORAL | Status: AC
  Administered 2012-07-24: 50 mg via ORAL
  Filled 2012-07-24: qty 1

## 2012-07-24 NOTE — Progress Notes (Signed)
Physical Therapy Treatment Patient Details Name: Christine Graham MRN: [id number] DOB: [birth year] Today's Date: 07/24/2012 Time: 2725-3664 PT Time Calculation (min): 14 min  PT Assessment / Plan / Recommendation Comments on Treatment Session  Pt appears very weak & with poor activity tolerance.  Cont to stronglly recommend ST-SNF at d/c.      Follow Up Recommendations  SNF     Does the patient have the potential to tolerate intense rehabilitation     Barriers to Discharge        Equipment Recommendations  None recommended by PT    Recommendations for Other Services    Frequency Min 3X/week   Plan Discharge plan remains appropriate;Frequency remains appropriate    Precautions / Restrictions Precautions Precautions: Fall Precaution Comments: NPO Restrictions Weight Bearing Restrictions: No       Mobility  Bed Mobility Bed Mobility: Rolling Right;Right Sidelying to Sit;Sitting - Scoot to Edge of Bed Rolling Right: 1: +2 Total assist Right Sidelying to Sit: 1: +2 Total assist Right Sidelying to Sit: Patient Percentage: 20% Sitting - Scoot to Edge of Bed: 1: +2 Total assist Details for Bed Mobility Assistance: cues to initiate rolling and sidelying to sit and to scoot to EOB Transfers Transfers: Sit to Stand;Stand to Sit;Stand Pivot Transfers Sit to Stand: 1: +2 Total assist;With upper extremity assist;From bed Sit to Stand: Patient Percentage: 40% Stand to Sit: 1: +2 Total assist;With upper extremity assist;To chair/3-in-1 Stand to Sit: Patient Percentage: 20% Stand Pivot Transfers: 1: +2 Total assist Stand Pivot Transfers: Patient Percentage: 40% Details for Transfer Assistance: Pt c/o weakness in legs.  She needs cues to push up with arms and extend hips and trunk in standing.  Decreased endurance      PT Goals Acute Rehab PT Goals Time For Goal Achievement: August 28, 2012 Potential to Achieve Goals: Good Pt will go Supine/Side to Sit: with min assist PT Goal:  Supine/Side to Sit - Progress: Not met Pt will go Sit to Supine/Side: with min assist Pt will Transfer Bed to Chair/Chair to Bed: with min assist PT Transfer Goal: Bed to Chair/Chair to Bed - Progress: Not met Pt will Stand: with min assist;1 - 2 min;with bilateral upper extremity support Pt will Ambulate: 16 - 50 feet;with min assist;with rolling walker Pt will Go Up / Down Stairs: 1-2 stairs;with supervision;with least restrictive assistive device  Visit Information  Last PT Received On: 07/24/12 Assistance Needed: +2    Subjective Data      Cognition  Cognition Arousal/Alertness: Awake/alert Behavior During Therapy: Flat affect Overall Cognitive Status: Impaired/Different from baseline Area of Impairment: Safety/judgement Safety/Judgement: Decreased awareness of safety;Decreased awareness of deficits    Insurance risk surveyor Sitting - Balance Support: Bilateral upper extremity supported;Feet supported Static Sitting - Level of Assistance: 2: Max assist Static Sitting - Comment/# of Minutes: Pt with significant trunk flexed forwards with head out in front of her knees without any awareness of decreased safety with this sitting posture.    End of Session PT - End of Session Equipment Utilized During Treatment: Gait belt Activity Tolerance: Patient limited by fatigue Patient left: in chair;with call bell/phone within reach;with family/visitor present Nurse Communication: Mobility status    Verdell Face, [place] [phone] 07/24/2012

## 2012-07-24 NOTE — Progress Notes (Addendum)
Discussed with Dr. Craige Cotta, Ms. Doran Durand, Christine Graham, Speech therapy.  [facility] PROGRESS NOTE  Christine Graham ZOX:[id number] DOB: 1926/01/17 DOA: 07/04/2012 PCP: Judie Petit, MD  Assessment/Plan:  Active Problems:   Diarrhea   HYPERTENSION   ESRD on dialysis   Acute encephalopathy   HYPERLIPIDEMIA   GOUT   Acute diastolic CHF (congestive heart failure)   Acute respiratory failure   Status epilepticus   Muscular deconditioning   Postextubation stridor   Other dysphagia  Patient looks worse today. Has inspiratory stridor. Will give a stat dose of Decadron. Discussed with Dr. Craige Cotta. Critical care medicine to evaluate potential need for transfer to step down or ICU or intubation. Looks much weaker. Did not eat breakfast. Obviously, not well enough to have speech reevaluate her today.  Code Status: full Family Communication: boyfriend Disposition Plan: SNF likely next week  Consultants:  Nephrology  CCM signed off  neurohospitalist  Procedures: ETT 5/29 >> 6/07  R [place] CVC 5/29 >> 6/12  Rt graft>>>  ETT 6/07 >> 6/11  Antibiotics: Vanco 5/27 >> 5/30  Cefepime 5/27 >> 5/30  Zosyn 5/28 x1 dose  Acyclovir 5/29 >> 5/31  HPI/Subjective: Denies shortness of breath. "I just don't feel well". Denies throat swelling.  Objective: Filed Vitals:   07/23/12 1612 07/23/12 2113 07/24/12 0500 07/24/12 0935  BP: 160/46 160/51 165/55 168/68  Pulse: 64 75 79 82  Temp: 98.8 F (37.1 C) 98.9 F (37.2 C) 98.7 F (37.1 C) 98.8 F (37.1 C)  TempSrc:  Oral Oral Oral  Resp: 18 18 18 17   Height:      Weight:  55.101 kg (121 lb 7.6 oz)    SpO2: 96% 98% 97% 95%    Intake/Output Summary (Last 24 hours) at 07/24/12 1044 Last data filed at 07/24/12 0503  Gross per 24 hour  Intake    360 ml  Output      0 ml  Net    360 ml   Filed Weights   07/21/12 2200 07/22/12 0630 07/23/12 2113  Weight: 55.2 kg (121 lb 11.1 oz) 55.1 kg (121 lb 7.6 oz) 55.101 kg (121 lb 7.6 oz)     Exam:   General:  Much weaker appearing. Has audible inspiratory stridor, but minimal respiratory distress.  HEENT: No tongue swelling.  Cardiovascular: RRR without MGR  Respiratory: Diminished throughout. No wheeze per se, mainly upper airway transmission  Abdomen: Soft, NT, ND  Ext:  No CCE  Data Reviewed: Basic Metabolic Panel:  Recent Labs Lab 07/18/12 0730 07/19/12 0500 07/19/12 2330 07/21/12 0540 07/22/12 0622  NA 134* 135 134* 137 133*  K 4.6 4.5 4.5 4.3 3.6  CL 91* 94* 94* 96 93*  CO2 26 29 27 27 23   GLUCOSE 113* 128* 119* 98 115*  BUN 125* 61* 79* 43* 72*  CREATININE 5.33* 2.93* 3.97* 3.13* 4.64*  CALCIUM 8.2* 8.3* 8.3* 8.4 8.1*  PHOS 5.2* 3.9 5.7* 5.3* 4.3   Liver Function Tests:  Recent Labs Lab 07/18/12 0730 07/19/12 0500 07/19/12 2330 07/21/12 0540 07/22/12 0622  ALBUMIN 2.5* 2.6* 2.5* 2.7* 2.7*   No results found for this basename: LIPASE, AMYLASE,  in the last 168 hours No results found for this basename: AMMONIA,  in the last 168 hours CBC:  Recent Labs Lab 07/18/12 0730 07/19/12 0500 07/19/12 2330 07/21/12 0540 07/22/12 0622  WBC 18.8* 19.1* 16.9* 13.9* 19.7*  HGB 8.8* 9.3* 9.2* 10.2* 11.1*  HCT 26.9* 29.0* 27.8* 31.7* 32.9*  MCV 96.8 98.6 96.9  99.1 96.5  PLT 394 384 380 415* 448*   Cardiac Enzymes: No results found for this basename: CKTOTAL, CKMB, CKMBINDEX, TROPONINI,  in the last 168 hours BNP (last 3 results)  Recent Labs  07/04/12 0724  PROBNP 45870.0*   CBG:  Recent Labs Lab 07/20/12 1945 07/20/12 2353 07/21/12 0356 07/21/12 0824 07/22/12 0908  GLUCAP 90 88 93 100* 127*    Recent Results (from the past 240 hour(s))  CLOSTRIDIUM DIFFICILE BY PCR     Status: None   Collection Time    07/21/12  3:03 PM      Result Value Range Status   C difficile by pcr NEGATIVE  NEGATIVE Final     Studies: No results found.  Scheduled Meds: . amLODipine  10 mg Oral QHS  . aspirin EC  325 mg Oral Daily  .  darbepoetin (ARANESP) injection - DIALYSIS  100 mcg Intravenous Q Tue-HD  . dexamethasone  4 mg Intravenous Q6H  . doxercalciferol  2 mcg Intravenous Q T,Th,Sa-HD  . feeding supplement  30 mL Oral BID  . metoprolol tartrate  25 mg Oral BID  . multivitamin  1 tablet Oral Daily  . sodium chloride  3 mL Intravenous Q12H   Continuous Infusions:   Time spent: 35 minutes  Okey Zelek L  [facility] Pager [phone]. If 7PM-7AM, please contact night-coverage at [email or web address], password [facility] 07/24/2012, 10:44 AM  LOS: 20 days   Since I evaluated the patient, she was subsequently placed in a chair by physical therapy. Stridor is gone! Discussed with CCM. No need to give Decadron. No need to transfer. Dr. Delford Field recommends ENT consultation, as she is still hoarse and has intermittent/positional stridor. We'll make sure that the head of her bed is elevated at all times.  Crista Curb

## 2012-07-24 NOTE — Progress Notes (Signed)
  [facility] Progress Note  Subjective:   Denies pain, SOB  Objective Filed Vitals:   07/23/12 1612 07/23/12 2113 07/24/12 0500 07/24/12 0935  BP: 160/46 160/51 165/55 168/68  Pulse: 64 75 79 82  Temp: 98.8 F (37.1 C) 98.9 F (37.2 C) 98.7 F (37.1 C) 98.8 F (37.1 C)  TempSrc:  Oral Oral Oral  Resp: 18 18 18 17   Height:      Weight:  55.101 kg (121 lb 7.6 oz)    SpO2: 96% 98% 97% 95%   Physical Exam General: NAD Heart: RRR Lungs: diffuse insp soft wheezes Abdomen: soft NT Extremities: no LE edema Dialysis Access: right upper AVF + bruit  Outpatient HD: [facility] 3.75hrs EDW 59kg Bath 2K/2.5CA F160 Heparin 2000 RUA AVF Hect 2ug EPO 2200  Assessment/Plan: 1. AMS/status epilepticus- felt due to cefipime toxicity. today. Off Keppra Off dilantin; previously intubated 2. ESRD - outpt schedule TTS -  3. Anemia - Hgb increasing - 11.1 6/14 100 Aranesp q Tuesday; if 11.1 on Tuesday, will reduce dose to 60 - ^ ferritin  4. Secondary hyperparathyroidism - ^ P slightly then down to 4.3 without binders - had not been on any binders PTA according to consult note by Dr. Eliott Nine, on hectorol 2  5. HTN/volume -  CXR 6/11: "Mild bibasilar air space disease with a small left pleural effusion. There may be a tiny right pleural effusion as well " ; on bid 25 mg metoprolol and norvasc 5 - UF 526 Sat and 2.4 on Thursday - needs pre and post HD weights 6. Nutrition - D2 diet - discussed with MD who wants to have ST re-eval to possibly advance diet - on multvit, prostat 7. Disp - NHP for rehab    Sheffield Slider, PA-C  [facility] Beeper [phone] 07/24/2012,10:18 AM  LOS: 20 days   Patient seen and examined.  Agree with assessment and plan as above. Vinson Moselle  MD [phone] pgr    [phone] cell 07/24/2012, 12:48 PM   Additional Objective Labs: Basic Metabolic Panel:  Recent Labs Lab 07/19/12 2330 07/21/12 0540 07/22/12 0622  NA 134* 137 133*  K 4.5  4.3 3.6  CL 94* 96 93*  CO2 27 27 23   GLUCOSE 119* 98 115*  BUN 79* 43* 72*  CREATININE 3.97* 3.13* 4.64*  CALCIUM 8.3* 8.4 8.1*  PHOS 5.7* 5.3* 4.3   Liver Function Tests:  Recent Labs Lab 07/19/12 2330 07/21/12 0540 07/22/12 0622  ALBUMIN 2.5* 2.7* 2.7*   CBC:  Recent Labs Lab 07/18/12 0730 07/19/12 0500 07/19/12 2330 07/21/12 0540 07/22/12 0622  WBC 18.8* 19.1* 16.9* 13.9* 19.7*  HGB 8.8* 9.3* 9.2* 10.2* 11.1*  HCT 26.9* 29.0* 27.8* 31.7* 32.9*  MCV 96.8 98.6 96.9 99.1 96.5  PLT 394 384 380 415* 448*  CBG:  Recent Labs Lab 07/20/12 1945 07/20/12 2353 07/21/12 0356 07/21/12 0824 07/22/12 0908  GLUCAP 90 88 93 100* 127*  Medications:   . amLODipine  10 mg Oral QHS  . aspirin EC  325 mg Oral Daily  . darbepoetin (ARANESP) injection - DIALYSIS  100 mcg Intravenous Q Tue-HD  . doxercalciferol  2 mcg Intravenous Q T,Th,Sa-HD  . feeding supplement  30 mL Oral BID  . metoprolol tartrate  25 mg Oral BID  . multivitamin  1 tablet Oral Daily  . sodium chloride  3 mL Intravenous Q12H

## 2012-07-24 NOTE — Progress Notes (Signed)
PULMONARY  / CRITICAL CARE MEDICINE  Name: Christine Graham MRN: [id number] DOB: 09/22/1925    ADMISSION DATE:  07/04/2012 CONSULTATION DATE:  07/06/12  REFERRING MD :  Dr Tat PRIMARY SERVICE: [facility]  CHIEF COMPLAINT:  Altered MS  BRIEF PATIENT DESCRIPTION:  79 y/o woman, hx ESRD, HTN, hyperlipidemia. Admitted with apparent viral syndrome, leukocytosis, edema pattern on CXR. Treated w empiric abx for HCAP. Since 5/27 progressive MS change to obtundation by am 5/29. EEG with epileptiform pattern. Brain MRI 5/28 with old CVA's, nothing acute. Received ativan and dilantin, PCCM assisted with resp failure due to MS. Re-called 6/16 for concerns for stridor.   SIGNIFICANT EVENTS: 6/07 - Extubated >> re-intubated for stridor, started decadron 6/16 - PCCM called for concerns for stridor   STUDIES:  5/28 Head CT: Age indeterminate right cerebellar infarction. This could represent an acute infarction. New high density rounded lesion right adjacent to the pons. This could represent the dolichoectasia or aneurysm of the vertebral artery. A meningioma is a secondary consideration. 5/28 Brain MRI 5/28: No acute intracranial abnormality. The right inferior cerebellar infarct is remote. Advanced atrophy and white matter disease. Remote lacunar infarcts of the basal ganglia and left cerebellum. EEG 5/31: consistent with a severe cerebral dysfunction. No definite seizures were recorded during this EEG.  EEG 6/09: mild encephalopathy, no seizure activty.   LINES / TUBES: ETT 5/29 >> 6/07 R  CVC 5/29 >>6/12 Rt graft>>> ETT 6/07 >>6/11  CULTURES: Blood 5/27 >> NEG Blood 5/29 >> NEG Sputum 5/27 >> few candida CSF bacterial 5/29 >> NEG CSF HSV 5/29 >> neg  ANTIBIOTICS: Vanco 5/27 >> 5/30 Cefepime 5/27 >> 5/30 Zosyn 5/28 x1 dose Acyclovir 5/29 >> 5/31  SUBJECTIVE: Pt reports she feels weak.    VITAL SIGNS: Temp:  [98.7 F (37.1 C)-98.9 F (37.2 C)] 98.8 F (37.1 C) (06/16 0935) Pulse Rate:   [64-82] 82 (06/16 0935) Resp:  [17-18] 17 (06/16 0935) BP: (160-168)/(46-68) 168/68 mmHg (06/16 0935) SpO2:  [95 %-100 %] 95 % (06/16 0935) Weight:  [121 lb 7.6 oz (55.101 kg)] 121 lb 7.6 oz (55.101 kg) (06/15 2113)  INTAKE / OUTPUT:  Intake/Output Summary (Last 24 hours) at 07/24/12 1115 Last data filed at 07/24/12 0503  Gross per 24 hour  Intake    360 ml  Output      0 ml  Net    360 ml     PHYSICAL EXAMINATION: General: No distress, up in chair Neuro: AAOx4, speech clear but raspy, MAE, weak 3/5  HEENT: mm pink/moist, no jvd Cardiovascular: regular Lungs: resp's even/non-labored, lungs bilaterally clear, moving air well, no upper airway noise Abdomen: soft, non tender Ext: no edema   LABS: BMET Lab Results  Component Value Date   CREATININE 4.64* 07/22/2012   BUN 72* 07/22/2012   NA 133* 07/22/2012   K 3.6 07/22/2012   CL 93* 07/22/2012   CO2 23 07/22/2012    CBC Lab Results  Component Value Date   WBC 19.7* 07/22/2012   HGB 11.1* 07/22/2012   HCT 32.9* 07/22/2012   MCV 96.5 07/22/2012   PLT 448* 07/22/2012   CBG (last 3)   Recent Labs  07/22/12 0908  GLUCAP 127*    Resolved Problems Acute Respiratory failure due to altered MS. - Resolved. B pleural effusions - resolved. Post-extubation stridor developed 6/07.   ASSESSMENT / PLAN:   Concerns for Stridor - exam reassuring, good air entry bilaterally with no upper airway noise.   P:   -  consider ENT eval for upper airway exam -monitor airway closely -hold decadron for now -continue speech therapy recommended diet -exercises with SLP    ESRD on HD P:   -HD T, Th, Sat per renal    Protein Calorie Malnutrition. Dysphagia. P:   -continue SLP efforts     PCCM will be available PRN.     Canary Brim, NP-C [facility] Pgr: [phone] or [phone]    I have seen and examined this pt and agree with above note. Dorcas Carrow Beeper  [phone]  Cell   [phone]  If no response or cell goes to voicemail, call beeper [phone]

## 2012-07-24 NOTE — Progress Notes (Signed)
Occupational Therapy Treatment Patient Details Name: Christine Graham MRN: [id number] DOB: 1925/07/03 Today's Date: 07/24/2012 Time: 8469-6295 OT Time Calculation (min): 14 min  OT Assessment / Plan / Recommendation Comments on Treatment Session Pt continues to be limited by fatigue and weakness. Physician present and noted pt wheezing and physician requested no more acitivty once OT/PT got pt up into chair. Pt to continue with acute OT services to maximize level of function and safety. D/C  plan i s for SNF    Follow Up Recommendations  SNF    Barriers to Discharge       Equipment Recommendations  Other (comment) (TBD at SNF level of care)    Recommendations for Other Services    Frequency Min 2X/week   Plan Discharge plan remains appropriate    Precautions / Restrictions Precautions Precautions: Fall Precaution Comments: NPO Restrictions Weight Bearing Restrictions: No   Pertinent Vitals/Pain 6/10 back pain    ADL  Grooming: Wash/dry hands;Wash/dry face;Set up;Min guard Where Assessed - Grooming: Supported sitting Upper Body Bathing: Performed;Maximal assistance Upper Body Dressing: +1 Total assistance Where Assessed - Upper Body Dressing: Supported sitting Lower Body Dressing: +1 Total assistance Where Assessed - Lower Body Dressing: Supine, head of bed up Toilet Transfer: Simulated;+2 Total assistance Toilet Transfer Method: Sit to stand Transfers/Ambulation Related to ADLs: Pt required total A + 2 for transfers and was able to move B feet to A therapists, pt was able A in scooting back in chair but required total A + 2 using pads to lift pt to scoot to back of recliner ADL Comments: Pt required max A for balance seated EOB during grooming and UB dressing tasks, pt unable to maintain balance with forward leaning and required verbal and physical cues    OT Diagnosis:    OT Problem List:   OT Treatment Interventions:     OT Goals ADL Goals ADL Goal: Grooming -  Progress: Not progressing ADL Goal: Upper Body Bathing - Progress: Not progressing ADL Goal: Toilet Transfer - Progress: Not progressing Miscellaneous OT Goals OT Goal: Miscellaneous Goal #1 - Progress: Not progressing OT Goal: Miscellaneous Goal #2 - Progress: Not progressing  Visit Information  Last OT Received On: 07/24/12 Assistance Needed: +2    Subjective Data  Subjective: " I don't feel good " Patient Stated Goal: None stated   Prior Functioning       Cognition  Cognition Arousal/Alertness: Awake/alert Behavior During Therapy: Flat affect Overall Cognitive Status: Impaired/Different from baseline Area of Impairment: Safety/judgement Safety/Judgement: Decreased awareness of safety;Decreased awareness of deficits    Mobility  Bed Mobility Bed Mobility: Rolling Right;Right Sidelying to Sit;Sitting - Scoot to Edge of Bed Rolling Right: 1: +2 Total assist Right Sidelying to Sit: 1: +2 Total assist Sitting - Scoot to Edge of Bed: 1: +2 Total assist Details for Bed Mobility Assistance: cues to initiate rolling and sidelying to sit and to scoot to EOB Transfers Transfers: Sit to Stand;Stand to Sit Sit to Stand: 1: +2 Total assist;With upper extremity assist;From bed Stand to Sit: 1: +2 Total assist;With upper extremity assist;To chair/3-in-1 Details for Transfer Assistance: Pt c/o weakness in legs.  She needs cues to push up with arms and extend hips and trunk in standing.  Decreased endurance    Exercises      Balance Static Sitting Balance Static Sitting - Balance Support: Bilateral upper extremity supported;Feet supported Static Sitting - Level of Assistance: 2: Max assist   End of Session OT - End of  Session Equipment Utilized During Treatment: Gait belt Activity Tolerance: Patient limited by fatigue Patient left: in chair;with call bell/phone within reach;with family/visitor present  GO     Galen Manila 07/24/2012, 1:07 PM

## 2012-07-24 NOTE — Progress Notes (Signed)
Speech Language Pathology Dysphagia Treatment Patient Details Name: Christine Graham MRN: [id number] DOB: 02-12-1925 Today's Date: 07/24/2012 Time: 1020-1040 SLP Time Calculation (min): 20 min  Assessment / Plan / Recommendation Clinical Impression  Pt. reportedly weaker today.  Essentially aphonic, with occasional stridor noted.  Pt. c/o back pain when HOB elevated, but pt. was able to get up to the chair with PT assist x2.  Pt. took ice chip with immediate cough noted after swallowing.  Pt. will need a FEES prior to advancing diet.  Recommend continuing Dys 2 diet with nectar thick liquids.  FEES in am, prior to dialysis.  Discussed case and plan with Dr. Lendell Caprice, as well as Pulmonary NP, Canary Brim.    Diet Recommendation  Continue with Current Diet: Dysphagia 2 (fine chop);Nectar-thick liquid    SLP Plan Continue with current plan of care   Pertinent Vitals/Pain C/O back pain (positional); repositioned from bed to chair by PT; RN notified as well.   Swallowing Goals  SLP Swallowing Goals Patient will consume recommended diet without observed clinical signs of aspiration with: Supervision/safety;Minimal assistance Swallow Study Goal #1 - Progress: Progressing toward goal Patient will utilize recommended strategies during swallow to increase swallowing safety with: Supervision/safety;Minimal assistance Swallow Study Goal #2 - Progress: Progressing toward goal  General Temperature Spikes Noted: No Respiratory Status: Supplemental O2 delivered via (comment) Behavior/Cognition: Lethargic;Decreased sustained attention;Distractible Oral Cavity - Dentition: Adequate natural dentition Patient Positioning: Upright in chair  Oral Cavity - Oral Hygiene Does patient have any of the following "at risk" factors?: Oxygen therapy - cannula, mask, simple oxygen devices Brush patient's teeth BID with toothbrush (using toothpaste with fluoride): Yes Patient is HIGH RISK - Oral Care Protocol  followed (see row info): Yes Patient is AT RISK - Oral Care Protocol followed (see row info): Yes Patient is mechanically ventilated, follow VAP prevention protocol for oral care: Oral care provided every 4 hours   Dysphagia Treatment Treatment focused on: Patient/family/caregiver education Family/Caregiver Educated: Significant other Treatment Methods/Modalities: Skilled observation Patient observed directly with PO's: Yes Type of PO's observed: Ice chips Feeding: Needs assist Pharyngeal Phase Signs & Symptoms: Suspected delayed swallow initiation;Immediate cough Type of cueing: Verbal;Tactile Amount of cueing: Moderate   GO     Christine Graham T 07/24/2012, 11:03 AM

## 2012-07-24 NOTE — Consult Note (Signed)
Christine Graham, Christine Graham 79 y.o., female [id number]     Chief Complaint: stridor, dysphonia  HPI: 79 yo wf hospitalized with progressive shortness of breath.  Treated with antibiotics.  Developed altered mental status and eventually seizures felt related to cefipime neurotoxicity.  Has HTN, ESRD.  Intubated 29 MAY.  Extubated 7 JUN with stridor necessitating rapid re-intubation.  Extubated again 11 JUN.  Since then, has had some questionably positional stridor and basic aphonia.  No aspiration.  Some success with oral diet, fluctuating with overall alertness and energy.  ENT called for assistance with stridor.    PMH: Past Medical History  Diagnosis Date  . Hyperlipidemia   . Hypertension   . Gout   . Chronic kidney disease   . Anemia     Surg Hx: Past Surgical History  Procedure Laterality Date  . Appendectomy    . Cholecystectomy    . Total hip arthroplasty    . Breast surgery      benign breast biopsy  . Av fistula placement  10/16/2010    (R) UA AVF  . Hip closed reduction  07/21/2011    Procedure: CLOSED REDUCTION HIP;  Surgeon: Toni Arthurs, MD;  Location: [facility] OR;  Service: Orthopedics;  Laterality: Right;    FHx:   Family History  Problem Relation Age of Onset  . Heart disease Father     MI  . Arthritis Mother   . Stroke Mother    SocHx:  reports that she has never smoked. She does not have any smokeless tobacco history on file. She reports that she does not drink alcohol or use illicit drugs.  ALLERGIES:  Allergies  Allergen Reactions  . Cefepime Other (See Comments)    encephalopathy  . Codeine Nausea Only  . Oxycodone Nausea Only    Medications Prior to Admission  Medication Sig Dispense Refill  . acetaminophen (TYLENOL) 325 MG tablet Take 650 mg by mouth every 6 (six) hours as needed. For pain/fever      . ALPRAZolam (XANAX) 0.25 MG tablet Take 1 tablet (0.25 mg total) by mouth 2 (two) times daily as needed. For anxiety  60 tablet  3  . amLODipine (NORVASC) 10 MG  tablet Take 1 tablet (10 mg total) by mouth daily.  90 tablet  0  . amoxicillin (AMOXIL) 500 MG capsule Take 500 mg by mouth 4 (four) times daily.      Marland Kitchen aspirin EC 81 MG tablet Take 81 mg by mouth daily.      Marland Kitchen atenolol (TENORMIN) 50 MG tablet Take 50 mg by mouth daily.      . Multiple Vitamin (MULTIVITAMIN WITH MINERALS) TABS Take 1 tablet by mouth daily.      . rosuvastatin (CRESTOR) 20 MG tablet Take 20 mg by mouth daily.        No results found for this or any previous visit (from the past 48 hour(s)). No results found.   Blood pressure 168/68, pulse 82, temperature 98.8 F (37.1 C), temperature source Oral, resp. rate 17, height 5' 6.93" (1.7 m), weight 55.101 kg (121 lb 7.6 oz), SpO2 95.00%.  PHYSICAL EXAM: Tired, but mentally basically appropriate.  Responds to question but aphonic.  Some vocal cord apposition with cough.  No stridor.  No coughing or throat clearing.  Ears:  Wax obstruction AS Nose:  Mild RIGHTward septal deviation Mouth:  Partial upper plate.  Severe periodontal disease.  No lesions.   OP:  Clear Neck: no adenopathy  Using the flexible  laryngoscope with informed consent, and 2% xylocaine viscous, 5 ml total for topical anesthesia, NP is clear.  OP clear.  HP clear.  Larynx shows ulceration with eschar over the RIGHT arytenoid, with confluent ulceration extending to the subglottic posterior commissure.  Small non confluent ulceration over the LEFT arytenoid.  Vocal cords fully mobile.  Airway good.  Some adherent mucus making exact interpretation of the extent of ulceration difficult.  I was able to pass the scope between the cords.  No tracheal stenosis.       Assessment/Plan Intubation trauma, with ulceration predominantly RIGHT arytenoid and subglottic posterior commissure.    Recommend good hydration and humidification, aggressive anti-reflux management.  OK for work with Speech Path regarding swallowing.  I will re-assess in 1 week, sooner as needed.   Cannot predict at this early date whether this will lead to glottic stenosis.  If any question of progression, would need a tracheostomy until the airway is stabilized.    Flo Shanks 07/24/2012, 1:18 PM

## 2012-07-25 ENCOUNTER — Inpatient Hospital Stay (HOSPITAL_COMMUNITY): Payer: Medicare Other

## 2012-07-25 DIAGNOSIS — T07XXXA Unspecified multiple injuries, initial encounter: Secondary | ICD-10-CM

## 2012-07-25 DIAGNOSIS — S1983XA Other specified injuries of vocal cord, initial encounter: Secondary | ICD-10-CM

## 2012-07-25 MED ORDER — ALTEPLASE 2 MG IJ SOLR
2.0000 mg | Freq: Once | INTRAMUSCULAR | Status: AC | PRN
  Filled 2012-07-25: qty 2

## 2012-07-25 MED ORDER — LIDOCAINE-PRILOCAINE 2.5-2.5 % EX CREA: 1.0000 "application " | TOPICAL_CREAM | CUTANEOUS | Status: DC | PRN

## 2012-07-25 MED ORDER — HEPARIN SODIUM (PORCINE) 1000 UNIT/ML DIALYSIS
20.0000 [IU]/kg | INTRAMUSCULAR | Status: DC | PRN
  Administered 2012-07-26 – 2012-08-01 (×2): 1100 [IU] via INTRAVENOUS_CENTRAL
  Filled 2012-07-25: qty 2

## 2012-07-25 MED ORDER — SODIUM CHLORIDE 0.9 % IV SOLN: 100.0000 mL | INTRAVENOUS | Status: DC | PRN

## 2012-07-25 MED ORDER — HEPARIN SODIUM (PORCINE) 1000 UNIT/ML DIALYSIS
1000.0000 [IU] | INTRAMUSCULAR | Status: DC | PRN
  Filled 2012-07-25: qty 1

## 2012-07-25 MED ORDER — PENTAFLUOROPROP-TETRAFLUOROETH EX AERO: 1.0000 "application " | INHALATION_SPRAY | CUTANEOUS | Status: DC | PRN

## 2012-07-25 MED ORDER — ACETAMINOPHEN 160 MG/5ML PO SOLN
650.0000 mg | Freq: Four times a day (QID) | ORAL | Status: DC | PRN
  Administered 2012-07-25 – 2012-07-31 (×8): 650 mg via ORAL
  Filled 2012-07-25 (×8): qty 20.3

## 2012-07-25 MED ORDER — NEPRO/CARBSTEADY PO LIQD
237.0000 mL | ORAL | Status: DC | PRN
  Filled 2012-07-25: qty 237

## 2012-07-25 MED ORDER — LIDOCAINE HCL (PF) 1 % IJ SOLN
5.0000 mL | INTRAMUSCULAR | Status: DC | PRN
  Filled 2012-07-25: qty 5

## 2012-07-25 MED ORDER — HYDROMORPHONE HCL PF 1 MG/ML IJ SOLN
0.2500 mg | Freq: Once | INTRAMUSCULAR | Status: AC
  Administered 2012-07-25: 0.25 mg via INTRAVENOUS
  Filled 2012-07-25: qty 1

## 2012-07-25 MED ORDER — PHENOL 1.4 % MT LIQD
1.0000 | OROMUCOSAL | Status: DC | PRN
  Administered 2012-07-25 – 2012-08-01 (×2): 1 via OROMUCOSAL
  Filled 2012-07-25: qty 177

## 2012-07-25 MED ORDER — PANTOPRAZOLE SODIUM 40 MG PO TBEC
40.0000 mg | DELAYED_RELEASE_TABLET | Freq: Two times a day (BID) | ORAL | Status: DC
  Administered 2012-07-25 – 2012-07-30 (×11): 40 mg via ORAL
  Filled 2012-07-25 (×11): qty 1

## 2012-07-25 NOTE — Progress Notes (Signed)
[facility] Progress Note  Subjective:   Needs help with tray set up. C/o right ear pain - started today. Denies SOB  Objective Filed Vitals:   07/24/12 1350 07/24/12 1937 07/24/12 2126 07/25/12 0550  BP: 180/68 184/61 185/67 147/66  Pulse: 67  75 66  Temp: 98.5 F (36.9 C) 98.3 F (36.8 C) 98.7 F (37.1 C) 97.8 F (36.6 C)  TempSrc: Oral Oral Oral Oral  Resp: 19 18 18 18   Height:      Weight:   55.101 kg (121 lb 7.6 oz)   SpO2: 98% 100% 97% 96%   Physical Exam General: ill, frail appearing NAD, speech soft Heart: RRR Lungs: upper airway BS- breathing easily Abdomen: soft NT Extremities: no LE edmea Dialysis Access: right upper AVF Outpatient HD: [facility] 3.75hrs EDW 59kg Bath 2K/2.5CA F160 Heparin 2000 RUA AVF Hect 2ug EPO 2200   Assessment/Plan:  1. AMS/status epilepticus- felt due to cefipime toxicity; off Keppra, off dilantin; previously intubated  2. ESRD - outpt schedule TTS - for HD today 3. Anemia - Hgb increasing - 11.1 6/14 100 Aranesp q Tuesday; if 11.1 on Tuesday, will reduce dose to 60 - ^ ferritin - no Fe 4. MBD/CKD- ^ P slightly then down to 4.3 without binders - had not been on any binders PTA according to consult note by Dr. Eliott Nine, on hectorol 2; check chemistries today. 5. HTN/volume - CXR 6/11: "Mild bibasilar air space disease with a small left pleural effusion. There may be a tiny right pleural effusion as well " ; on bid 25 mg metoprolol and norvasc 5 - UF 526 Sat and 2.4 on Thursday - needs pre and post HD weights  6. Nutrition - D2 diet - discussed with MD who wants to have ST re-eval to possibly advance diet - on multvit, prostat; Seen by ENT - has vocal corder ulceration related to intubation 7. Disp - NHP for rehab  Sheffield Slider, PA-C [facility] Beeper [phone] 07/25/2012,9:12 AM  LOS: 21 days   Patient seen and examined.  Agree with assessment and plan as above. Vinson Moselle  MD [phone] pgr    [phone]  cell 07/25/2012, 1:17 PM   Additional Objective Labs: Basic Metabolic Panel:  Recent Labs Lab 07/19/12 2330 07/21/12 0540 07/22/12 0622  NA 134* 137 133*  K 4.5 4.3 3.6  CL 94* 96 93*  CO2 27 27 23   GLUCOSE 119* 98 115*  BUN 79* 43* 72*  CREATININE 3.97* 3.13* 4.64*  CALCIUM 8.3* 8.4 8.1*  PHOS 5.7* 5.3* 4.3   Liver Function Tests:  Recent Labs Lab 07/19/12 2330 07/21/12 0540 07/22/12 0622  ALBUMIN 2.5* 2.7* 2.7*   CBC:  Recent Labs Lab 07/19/12 0500 07/19/12 2330 07/21/12 0540 07/22/12 0622  WBC 19.1* 16.9* 13.9* 19.7*  HGB 9.3* 9.2* 10.2* 11.1*  HCT 29.0* 27.8* 31.7* 32.9*  MCV 98.6 96.9 99.1 96.5  PLT 384 380 415* 448*   CBG:  Recent Labs Lab 07/20/12 1945 07/20/12 2353 07/21/12 0356 07/21/12 0824 07/22/12 0908  GLUCAP 90 88 93 100* 127*  Medications:   . amLODipine  10 mg Oral QHS  . aspirin EC  325 mg Oral Daily  . darbepoetin (ARANESP) injection - DIALYSIS  100 mcg Intravenous Q Tue-HD  . doxercalciferol  2 mcg Intravenous Q T,Th,Sa-HD  . feeding supplement  30 mL Oral BID  . metoprolol tartrate  25 mg Oral BID  . multivitamin  1 tablet Oral Daily  .  pantoprazole  40 mg Oral BID  . sodium chloride  3 mL Intravenous Q12H

## 2012-07-25 NOTE — Progress Notes (Signed)
[facility] PROGRESS NOTE  LANIJAH WARZECHA YNW:[id number] DOB: Aug 19, 1925 DOA: 07/04/2012 PCP: Judie Petit, MD  Brief summary:  79 yo woman, hx ESRD, HTN, hyperlipidemia. Admitted with apparent viral syndrome, leukocytosis, edema pattern on CXR. Treated w empiric abx for HCAP. Since 5/27 progressive MS change to obtundation by am 5/29. EEG with epileptiform pattern. Brain MRI 5/28 with old CVA's, nothing acute. Received ativan and dilantin, but was felt to have continued non-convulsive seizures, so patient was intubated and started on Diprivan drip. No previous history of seizures. May have been caused by cefepime. Was extubated and had postextubation stridor requiring reintubation and steroids. Patient was successfully extubated and steroids have been weaned off. Continues to have periods of stridor mainly when lying flat, and dysphonia. See below.  Assessment/Plan:    Postextubation stridor, and now intermittent and positional, mainly with lying flat. Dr. Lazarus Salines consulted yesterday and found ulceration with eschar over her right arytenoid with confluent ulceration extending into the subglottic posterior commissure. Small non-confluent ulceration of the left arytenoid. Vocal cords were mobile and airway looked good. No tracheal stenosis noted. He recommends good hydration and humidification with aggressive antireflux management. Have started protonix twice a day. Order to keep head of bed elevated at all time Speech therapy will perform FEES today and see whether diet can be advanced. Dr. Lazarus Salines will reassess in a week or sooner if needed. I would monitor airway and diet advancement for a few more days prior to transfer to skilled nursing facility.    Diarrhea, resolved, C. difficile negative.    HYPERTENSION    ESRD on dialysis  Resolved non-convulsive status epilepticus    HYPERLIPIDEMIA    GOUT    Acute diastolic CHF (congestive heart failure), compensated    Status  epilepticus    Muscular deconditioning, severe. Will eventually need skilled nursing facility    Other dysphagia: See above    Trauma to vocal cord: See above  Code Status: full Family Communication:  Disposition Plan: SNF   Consultants:  Nephrology  CCM signed off  neurohospitalist signed off  ENT [facility]  Procedures: ETT 5/29 >> 6/07  R [place] CVC 5/29 >> 6/12  Rt graft>>>  ETT 6/07 >> 6/11 Laryngoscopy 6/16  Antibiotics: Vanco 5/27 >> 5/30  Cefepime 5/27 >> 5/30  Zosyn 5/28 x1 dose  Acyclovir 5/29 >> 5/31  HPI/Subjective: Feels weak. Sad about her health. Per nursing staff, no diarrhea. Eating well.  Objective: Filed Vitals:   07/24/12 1937 07/24/12 2126 07/25/12 0550 07/25/12 0919  BP: 184/61 185/67 147/66 167/58  Pulse:  75 66 72  Temp: 98.3 F (36.8 C) 98.7 F (37.1 C) 97.8 F (36.6 C) 98.1 F (36.7 C)  TempSrc: Oral Oral Oral   Resp: 18 18 18 17   Height:      Weight:  55.101 kg (121 lb 7.6 oz)    SpO2: 100% 97% 96% 100%    Intake/Output Summary (Last 24 hours) at 07/25/12 1017 Last data filed at 07/25/12 0944  Gross per 24 hour  Intake      6 ml  Output      0 ml  Net      6 ml   Filed Weights   07/22/12 0630 07/23/12 2113 07/24/12 2126  Weight: 55.1 kg (121 lb 7.6 oz) 55.101 kg (121 lb 7.6 oz) 55.101 kg (121 lb 7.6 oz)    Exam:   General: Weak appearing. Lying flat in bed  HEENT: No tongue swelling. Stridor noted, but  good air movement--> have elevated head of bed  Cardiovascular: RRR without MGR  Respiratory: Diminished throughout. No wheeze, mainly upper airway transmission  Abdomen: Soft, NT, ND  Ext:  No CCE  Data Reviewed: Basic Metabolic Panel:  Recent Labs Lab 07/19/12 0500 07/19/12 2330 07/21/12 0540 07/22/12 0622  NA 135 134* 137 133*  K 4.5 4.5 4.3 3.6  CL 94* 94* 96 93*  CO2 29 27 27 23   GLUCOSE 128* 119* 98 115*  BUN 61* 79* 43* 72*  CREATININE 2.93* 3.97* 3.13* 4.64*  CALCIUM 8.3* 8.3* 8.4 8.1*  PHOS  3.9 5.7* 5.3* 4.3   Liver Function Tests:  Recent Labs Lab 07/19/12 0500 07/19/12 2330 07/21/12 0540 07/22/12 0622  ALBUMIN 2.6* 2.5* 2.7* 2.7*   No results found for this basename: LIPASE, AMYLASE,  in the last 168 hours No results found for this basename: AMMONIA,  in the last 168 hours CBC:  Recent Labs Lab 07/19/12 0500 07/19/12 2330 07/21/12 0540 07/22/12 0622  WBC 19.1* 16.9* 13.9* 19.7*  HGB 9.3* 9.2* 10.2* 11.1*  HCT 29.0* 27.8* 31.7* 32.9*  MCV 98.6 96.9 99.1 96.5  PLT 384 380 415* 448*   Cardiac Enzymes: No results found for this basename: CKTOTAL, CKMB, CKMBINDEX, TROPONINI,  in the last 168 hours BNP (last 3 results)  Recent Labs  07/04/12 0724  PROBNP 45870.0*   CBG:  Recent Labs Lab 07/20/12 1945 07/20/12 2353 07/21/12 0356 07/21/12 0824 07/22/12 0908  GLUCAP 90 88 93 100* 127*    Recent Results (from the past 240 hour(s))  CLOSTRIDIUM DIFFICILE BY PCR     Status: None   Collection Time    07/21/12  3:03 PM      Result Value Range Status   C difficile by pcr NEGATIVE  NEGATIVE Final     Studies: No results found.  Scheduled Meds: . amLODipine  10 mg Oral QHS  . aspirin EC  325 mg Oral Daily  . darbepoetin (ARANESP) injection - DIALYSIS  100 mcg Intravenous Q Tue-HD  . doxercalciferol  2 mcg Intravenous Q T,Th,Sa-HD  . feeding supplement  30 mL Oral BID  . metoprolol tartrate  25 mg Oral BID  . multivitamin  1 tablet Oral Daily  . pantoprazole  40 mg Oral BID  . sodium chloride  3 mL Intravenous Q12H   Continuous Infusions:   Time spent: 35 minutes  Gaylynn Seiple L  [facility] Pager [phone]. If 7PM-7AM, please contact night-coverage at [email or web address], password [facility] 07/25/2012, 10:17 AM  LOS: 21 days

## 2012-07-25 NOTE — Procedures (Signed)
Objective Swallowing Evaluation: Fiberoptic Endoscopic Evaluation of Swallowing  Patient Details  Name: Christine Graham MRN: [id number] Date of Birth: 06-17-1925  Today's Date: 07/25/2012 Time: 1010-     Past Medical History:  Past Medical History  Diagnosis Date  . Hyperlipidemia   . Hypertension   . Gout   . Chronic kidney disease   . Anemia    Past Surgical History:  Past Surgical History  Procedure Laterality Date  . Appendectomy    . Cholecystectomy    . Total hip arthroplasty    . Breast surgery      benign breast biopsy  . Av fistula placement  10/16/2010    (R) UA AVF  . Hip closed reduction  07/21/2011    Procedure: CLOSED REDUCTION HIP;  Surgeon: Toni Arthurs, MD;  Location: [facility] OR;  Service: Orthopedics;  Laterality: Right;   HPI:  79 yo woman, hx ESRD, HTN, hyperlipidemia. Admitted with apparent viral syndrome, leukocytosis, edema pattern on CXR. Treated w empiric abx for HCAP. Since 5/27 progressive MS change to obtundation by am 5/29. EEG with epileptiform pattern. Brain MRI 5/28 with old CVA's, nothing acute.  ETT 5/29 >> 6/07; reintubated 6/7, successfully extubated this am.  Pt requesting coffee     Assessment / Plan / Recommendation Clinical Impression  Dysphagia Diagnosis: Moderate pharyngeal phase dysphagia;Severe pharyngeal phase dysphagia Clinical impression: Patient exhibits moderate to severe pharyngeal dysphagia, as evidenced by delayed swallow initiation, reduced tongue base and pharyngeal contraction, decreased laryngeal elevation, and decreased laryngeal closure.  These deficits result in positive aspiration of melted ice (thin liquid), trace aspiration of nectar (no cough response), and significant laryngeal residue with nectar and honey thick liquids, as well as puree.  Residue of part of the cracker was in the right pyriform sinus, and puree and liquids coated the posterior pharyngeal wall.  Liquids were also noted at the interarytenord space.  Pt. was  unable to follow compensatory strategies  to consistently clear residue or prevent penetration/aspiration, and therefore, is at risk for aspiration after the swallow with all consistencies.  Discussed results with Dr. Lendell Caprice, who will f/u with family re: options of NPO with tube feedings vs. allow continued dysphagia 2 with nectar thick liquids with acceptance of risks associated with aspiration.  If the later is chosen, code status would need to be addressed.    Treatment Recommendation  Therapy as outlined in treatment plan below    Diet Recommendation NPO except meds   Medication Administration: Whole meds with puree    Other  Recommendations     Follow Up Recommendations       Frequency and Duration min 4x/week  2 weeks   Pertinent Vitals/Pain n/a    SLP Swallow Goals Goal #3: Pt. will complete vairous exercises designed to improve oropharyngeal strength for swallow function, with moderate assist. Goal #4: Pt. and family will verbalize/demonstrate steps and rationale for Fraizer water protocol prior to allowing occasional ice chip.   General Date of Onset: 07/04/12 HPI: 79 yo woman, hx ESRD, HTN, hyperlipidemia. Admitted with apparent viral syndrome, leukocytosis, edema pattern on CXR. Treated w empiric abx for HCAP. Since 5/27 progressive MS change to obtundation by am 5/29. EEG with epileptiform pattern. Brain MRI 5/28 with old CVA's, nothing acute.  ETT 5/29 >> 6/07; reintubated 6/7, successfully extubated this am.  Pt requesting coffee Type of Study: Fiberoptic Endoscopic Evaluation of Swallowing Reason for Referral: Objectively evaluate swallowing function Previous Swallow Assessment: BSE 07/22/12, Dys 2, Nectar Diet Prior  to this Study: Dysphagia 2 (chopped);Nectar-thick liquids Temperature Spikes Noted: No Respiratory Status: Supplemental O2 delivered via (comment) History of Recent Intubation: Yes Length of Intubations (days): 14 days (Intubated x2) Date extubated:  07/19/12 Behavior/Cognition: Alert;Cooperative;Impulsive;Distractible;Requires cueing;Decreased sustained attention Oral Cavity - Dentition: Adequate natural dentition Oral Motor / Sensory Function: Within functional limits Self-Feeding Abilities: Able to feed self;Needs set up Patient Positioning: Upright in bed Baseline Vocal Quality: Breathy;Hoarse;Low vocal intensity Volitional Cough: Weak Volitional Swallow: Able to elicit Anatomy:  (Ulceration on right vocal cord due to trauma from intubation) Pharyngeal Secretions: Standing secretions in (comment) (in pharynx)    Reason for Referral Objectively evaluate swallowing function   Oral Phase Oral Preparation/Oral Phase Oral Phase: WFL   Pharyngeal Phase Pharyngeal Phase Pharyngeal Phase: Impaired Pharyngeal - Honey Pharyngeal - Honey Cup: Delayed swallow initiation;Premature spillage to valleculae;Reduced airway/laryngeal closure;Reduced tongue base retraction;Penetration/Aspiration during swallow;Penetration/Aspiration after swallow;Inter-arytenoid space residue;Pharyngeal residue - cp segment;Pharyngeal residue - posterior pharnyx;Compensatory strategies attempted (Comment) (Chin tuck attempted, pt. unable to f/c consistently.) Penetration/Aspiration details (honey cup): Material enters airway, CONTACTS cords and not ejected out Pharyngeal - Nectar Pharyngeal - Nectar Cup: Delayed swallow initiation;Premature spillage to pyriform sinuses;Reduced pharyngeal peristalsis;Reduced laryngeal elevation;Reduced airway/laryngeal closure;Reduced tongue base retraction;Penetration/Aspiration during swallow;Pharyngeal residue - pyriform sinuses;Pharyngeal residue - posterior pharnyx;Pharyngeal residue - valleculae;Inter-arytenoid space residue;Compensatory strategies attempted (Comment) (Pt. unable) Penetration/Aspiration details (nectar cup): Material enters airway, CONTACTS cords and not ejected out Pharyngeal - Thin Pharyngeal - Ice Chips: Delayed  swallow initiation;Premature spillage to valleculae;Reduced laryngeal elevation;Reduced airway/laryngeal closure;Reduced tongue base retraction;Penetration/Aspiration during swallow;Moderate aspiration;Inter-arytenoid space residue (Positive cough with aspiration of ice (thin liquid)) Penetration/Aspiration details (ice chips): Material enters airway, passes BELOW cords then ejected out Pharyngeal - Solids Pharyngeal - Puree: Delayed swallow initiation;Premature spillage to valleculae;Reduced pharyngeal peristalsis;Reduced laryngeal elevation;Reduced tongue base retraction;Pharyngeal residue - valleculae;Pharyngeal residue - posterior pharnyx Pharyngeal - Mechanical Soft: Delayed swallow initiation;Reduced pharyngeal peristalsis;Reduced tongue base retraction;Pharyngeal residue - pyriform sinuses (part or cracker remained in right pyriform sinus)  Cervical Esophageal Phase    GO              [name] T 07/25/2012, 11:32 AM

## 2012-07-25 NOTE — Progress Notes (Signed)
07/25/2012 1:29 PM  Called the patient's son, Arlys John, to give him an update on the patient and the NG tube insertion.  I informed him that fluoro would place the NG tube this afternoon, and that she would receive nutrition through this tube, at least temporarily.  He didn't have any other questions.  He was very pleasant and thanked me for the update.  I informed him that Dr. Lendell Caprice would call him with further details this afternoon.   Eunice Blase

## 2012-07-25 NOTE — Clinical Social Work Placement (Addendum)
Clinical Social Work Department CLINICAL SOCIAL WORK PLACEMENT NOTE 07/25/2012  Patient:  Christine Graham, Christine Graham  Account Number:  [id number] Admit date:  07/04/2012  Discharge date: 08/02/12  Clinical Social Worker:  Genelle Bal, LCSW  Date/time:  07/25/2012 05:36 AM  Clinical Social Work is seeking post-discharge placement for this patient at the following level of care:   SKILLED NURSING   (*CSW will update this form in Epic as items are completed)   07/21/2012  Patient/family provided with [facility] Department of Clinical Social Work's list of facilities offering this level of care within the geographic area requested by the patient (or if unable, by the patient's family).  07/21/2012  Patient/family informed of their freedom to choose among providers that offer the needed level of care, that participate in Medicare, Medicaid or managed care program needed by the patient, have an available bed and are willing to accept the patient.    Patient/family informed of [facility]' ownership interest in [facility], as well as of the fact that they are under no obligation to receive care at this facility.  PASARR submitted to EDS on 07/20/12 PASARR number received from EDS on 07/20/12 - [phone] [id number]   FL2 transmitted to all facilities in geographic area requested by pt/family on  07/21/2012 FL2 transmitted to all facilities within larger geographic area on   Patient informed that his/her managed care company has contracts with or will negotiate with  certain facilities, including the following:     Patient/family informed of bed offers received:  07/25/2012 Patient chooses bed at [facility] Physician recommends and patient chooses bed at    Patient to be transferred to [facility] on  08/02/12 Patient to be transferred to facility by ambulance   The following physician request were entered in Epic:   Additional Comments: 07/25/12: CSW talked with  patient's son Arlys John and daughter to give facility responses. Their preference is [facility].

## 2012-07-25 NOTE — Progress Notes (Signed)
Pt is leaving floor.

## 2012-07-25 NOTE — Progress Notes (Addendum)
SPEECH PATHOLOGY  ENT   Assessment/Plan  Noted: Intubation trauma, with ulceration predominantly RIGHT arytenoid and subglottic posterior commissure.  Recommend good hydration and humidification, aggressive anti-reflux management. OK for work with Speech Path regarding swallowing. I will re-assess in 1 week, sooner as needed. Cannot predict at this early date whether this will lead to glottic stenosis. If any question of progression, would need a tracheostomy until the airway is stabilized.   Currently, pt. Appears more alert and is sitting up eating breakfast.  Educated pt. To FEES procedure, to be completed this am with intention of advancing diet to Dysphagia 3 and thin liquids if no aspiration (previous s/s of aspiration with thin liquids).  Pt. Agrees to the procedure.  Full report to follow.  [name], CCC-SLP

## 2012-07-26 ENCOUNTER — Ambulatory Visit: Payer: Medicare Other | Admitting: Internal Medicine

## 2012-07-26 ENCOUNTER — Encounter (HOSPITAL_COMMUNITY): Payer: Self-pay | Admitting: Physician Assistant

## 2012-07-26 ENCOUNTER — Inpatient Hospital Stay (HOSPITAL_COMMUNITY): Payer: Medicare Other

## 2012-07-26 DIAGNOSIS — R4182 Altered mental status, unspecified: Secondary | ICD-10-CM

## 2012-07-26 DIAGNOSIS — I4891 Unspecified atrial fibrillation: Secondary | ICD-10-CM

## 2012-07-26 DIAGNOSIS — T889XXS Complication of surgical and medical care, unspecified, sequela: Secondary | ICD-10-CM

## 2012-07-26 LAB — RENAL FUNCTION PANEL
Albumin: 2.8 g/dL — ABNORMAL LOW (ref 3.5–5.2)
BUN: 116 mg/dL — ABNORMAL HIGH (ref 6–23)
CO2: 20 mEq/L (ref 19–32)
Calcium: 7.8 mg/dL — ABNORMAL LOW (ref 8.4–10.5)
Chloride: 89 mEq/L — ABNORMAL LOW (ref 96–112)
Creatinine, Ser: 6.92 mg/dL — ABNORMAL HIGH (ref 0.50–1.10)
GFR calc Af Amer: 6 mL/min — ABNORMAL LOW (ref >90)
GFR calc non Af Amer: 5 mL/min — ABNORMAL LOW (ref >90)
Glucose, Bld: 105 mg/dL — ABNORMAL HIGH (ref 70–99)
Phosphorus: 6.9 mg/dL — ABNORMAL HIGH (ref 2.3–4.6)
Potassium: 5.1 mEq/L (ref 3.5–5.1)
Sodium: 129 mEq/L — ABNORMAL LOW (ref 135–145)

## 2012-07-26 LAB — CBC
HCT: 32.9 % — ABNORMAL LOW (ref 36.0–46.0)
Hemoglobin: 11.1 g/dL — ABNORMAL LOW (ref 12.0–15.0)
MCH: 32.6 pg (ref 26.0–34.0)
MCHC: 33.7 g/dL (ref 30.0–36.0)
MCV: 96.5 fL (ref 78.0–100.0)
Platelets: 350 10*3/uL (ref 150–400)
RBC: 3.41 MIL/uL — ABNORMAL LOW (ref 3.87–5.11)
RDW: 18.4 % — ABNORMAL HIGH (ref 11.5–15.5)
WBC: 16.4 10*3/uL — ABNORMAL HIGH (ref 4.0–10.5)

## 2012-07-26 LAB — TSH: TSH: 3.039 u[IU]/mL (ref 0.350–4.500)

## 2012-07-26 LAB — TROPONIN I: Troponin I: <0.3 ng/mL (ref <0.30)

## 2012-07-26 LAB — MAGNESIUM: Magnesium: 2 mg/dL (ref 1.5–2.5)

## 2012-07-26 MED ORDER — ENSURE PUDDING PO PUDG
1.0000 | Freq: Three times a day (TID) | ORAL | Status: DC
  Administered 2012-07-26 – 2012-07-31 (×7): 1 via ORAL
  Filled 2012-07-26 (×3): qty 1

## 2012-07-26 MED ORDER — DARBEPOETIN ALFA-POLYSORBATE 100 MCG/0.5ML IJ SOLN
INTRAMUSCULAR | Status: AC
  Administered 2012-07-26: 100 ug via INTRAVENOUS
  Filled 2012-07-26: qty 0.5

## 2012-07-26 MED ORDER — DOXERCALCIFEROL 4 MCG/2ML IV SOLN
INTRAVENOUS | Status: AC
  Administered 2012-07-26: 2 ug via INTRAVENOUS
  Filled 2012-07-26: qty 2

## 2012-07-26 MED ORDER — AMIODARONE HCL IN DEXTROSE 360-4.14 MG/200ML-% IV SOLN
30.0000 mg/h | INTRAVENOUS | Status: DC
  Filled 2012-07-26 (×3): qty 200

## 2012-07-26 MED ORDER — METOPROLOL TARTRATE 1 MG/ML IV SOLN: 5.0000 mg | Freq: Once | INTRAVENOUS | Status: AC

## 2012-07-26 MED ORDER — AMIODARONE HCL IN DEXTROSE 360-4.14 MG/200ML-% IV SOLN
30.0000 mg/h | INTRAVENOUS | Status: DC
  Administered 2012-07-26 – 2012-07-29 (×7): 30 mg/h via INTRAVENOUS
  Filled 2012-07-26 (×12): qty 200

## 2012-07-26 MED ORDER — BIOTENE DRY MOUTH MT LIQD: 15.0000 mL | OROMUCOSAL | Status: DC | PRN

## 2012-07-26 MED ORDER — METOPROLOL TARTRATE 1 MG/ML IV SOLN
INTRAVENOUS | Status: AC
  Administered 2012-07-26: 5 mg via INTRAVENOUS
  Filled 2012-07-26: qty 5

## 2012-07-26 MED ORDER — RENA-VITE PO TABS
1.0000 | ORAL_TABLET | Freq: Every day | ORAL | Status: DC
  Administered 2012-07-26 – 2012-08-02 (×7): 1 via ORAL
  Filled 2012-07-26 (×8): qty 1

## 2012-07-26 MED ORDER — AMIODARONE LOAD VIA INFUSION
150.0000 mg | Freq: Once | INTRAVENOUS | Status: AC
  Administered 2012-07-26: 150 mg via INTRAVENOUS
  Filled 2012-07-26: qty 83.34

## 2012-07-26 MED ORDER — AMIODARONE HCL IN DEXTROSE 360-4.14 MG/200ML-% IV SOLN
60.0000 mg/h | INTRAVENOUS | Status: DC
  Administered 2012-07-26: 60 mg/h via INTRAVENOUS
  Filled 2012-07-26: qty 200

## 2012-07-26 MED ORDER — SODIUM CHLORIDE 0.9 % IV SOLN
INTRAVENOUS | Status: DC
  Administered 2012-07-26: 10 mL/h via INTRAVENOUS

## 2012-07-26 NOTE — Progress Notes (Signed)
Called by primary RN for a second set of eyes, for family has concerns for patients tongue hanging to the right side.  Upon arrival to patients room, Dr. Malachi Bonds present at bedside and assessing patient.  Rn to call if assistance needd

## 2012-07-26 NOTE — Progress Notes (Signed)
PT Cancellation Note  Patient Details Name: Christine Graham MRN: [id number] DOB: 12/07/1925   Cancelled Treatment:    Reason Eval/Treat Not Completed: Medical issues which prohibited therapy--afib with RVR. 07/26/2012  Pax Bing, PT [phone]  (pager)    Kathrine Rieves, Eliseo Gum 07/26/2012, 1:25 PM

## 2012-07-26 NOTE — Progress Notes (Signed)
I called patient's daughter and explained that the patient will need to move to [facility] d/t patient in need of aminodarone drip for rate control of afib w/ rvr.  All of patient's belongings gathered and brought to hd for transfer to [facility] from there.  The daughter was happy for the update on the patient.

## 2012-07-26 NOTE — Progress Notes (Addendum)
NUTRITION FOLLOW UP  DOCUMENTATION CODES  Per approved criteria   -Severe malnutrition in the acute illness   Intervention:   1. Continue 30 ml Prostat po BID. 2. Add Ensure Pudding po TID, each supplement provides 170 kcal and 4 grams of protein.  3. Will downgrade to Nectar Thickened liquids as this was last recommended consistency per SLP note 6/17 4. RD to continue to follow nutrition care plan.  New Nutrition Dx:   Swallowing difficulty r/t dysphagia and recent prolonged intubation AEB need for dysphagia diet and ST. Ongoing.  Goal:   Pt to meet >/= 90% of their estimated nutrition needs; not met.   Monitor:   PO intake, supplement tolerance, weight trend, labs  Assessment:   Per neurology pt likely with cefepime related neurotoxicity. Extubated 6/11.  ENT consulted to work-up stridor; MD noted pt with intubation trauma, more specifically ulceration predominantly of RIGHT arytenoid and subglottic posterior commissure. Pt underwent FEES 6/17 with recommendations of NPO status. After FEES results, MD ordered NGT placement for EN. NGT attempted under fluoro x 3 today, however pt refused any further attempts. Family was contacted and decided they don't want any TF or TPN, they agreed for resumption of safest diet. Dysphagia 2 diet resumed and pt on thin liquids. Ordered for Prostat po BID.  Discussed during progression rounds.  Per renal, pt now significantly below her EDW of 59 kg. Now with 6% wt loss since admission. Intake very poor.  Pt meets criteria for severe MALNUTRITION in the context of acute illnes as evidenced by intake of <50% x at least 5 days and approx 6% wt loss x < 1 month.   Height: Ht Readings from Last 1 Encounters:  07/06/12 5' 6.93" (1.7 m)    Weight Status:   Wt Readings from Last 1 Encounters:  07/26/12 121 lb 0.5 oz (54.9 kg)  Post HD weight 123 lb 5/27; wt trending down with HD and inadequate oral intake  Re-estimated needs:  Kcal: 1500 -  1700 Protein: 66 - 80 grams Fluid: 1.2 L/day  Skin: stage I pressure ulcer to back  Diet Order: Dysphagia 2; Thin Liquids   Intake/Output Summary (Last 24 hours) at 07/26/12 1005 Last data filed at 07/25/12 2300  Gross per 24 hour  Intake    350 ml  Output      0 ml  Net    350 ml    Last BM: 6/16   Labs:   Recent Labs Lab 07/21/12 0540 07/22/12 0622 07/26/12 0700  NA 137 133* 129*  K 4.3 3.6 5.1  CL 96 93* 89*  CO2 27 23 20   BUN 43* 72* 116*  CREATININE 3.13* 4.64* 6.92*  CALCIUM 8.4 8.1* 7.8*  MG  --   --  2.0  PHOS 5.3* 4.3 6.9*  GLUCOSE 98 115* 105*    CBG (last 3)  No results found for this basename: GLUCAP,  in the last 72 hours No results found for this basename: HGBA1C   Scheduled Meds: . amiodarone  150 mg Intravenous Once  . darbepoetin (ARANESP) injection - DIALYSIS  100 mcg Intravenous Q Tue-HD  . doxercalciferol  2 mcg Intravenous Q T,Th,Sa-HD  . feeding supplement  30 mL Oral BID  . metoprolol tartrate  25 mg Oral BID  . multivitamin  1 tablet Oral QHS  . pantoprazole  40 mg Oral BID  . sodium chloride  3 mL Intravenous Q12H    Continuous Infusions: . amiodarone (NEXTERONE PREMIX) 360 mg/200  mL dextrose     Followed by  . amiodarone (NEXTERONE PREMIX) 360 mg/200 mL dextrose      Jarold Motto MS, RD, LDN Pager: [phone] After-hours pager: [phone]

## 2012-07-26 NOTE — Progress Notes (Signed)
[facility] PROGRESS NOTE  Christine Graham ZOX:[id number] DOB: 1926-02-07 DOA: 07/04/2012 PCP: Judie Petit, MD  Assessment/Plan  New onset atrial fibrillation with RVR and relative hypotension -  EKG demonstrates A. fib with RVR and ST segment depressions in the lateral leads. -  Check electrolytes, including magnesium -  Cycle troponin -  TSH -  Check echo when heart rate is less than 100 -  Consider rhythm control versus rate control per cardiology, consult pending -  Will need to discuss anticoagulation as CHAD2vasc score is high, consider heparin gtt -  Place hold parameter for metoprolol -  Discontinue Norvasc  Postextubation stridor. Dr. Lazarus Salines found ulceration with eschar over her right arytenoid with confluent ulceration extending into the subglottic posterior commissure. Small non-confluent ulceration of the left arytenoid. Vocal cords were mobile and airway looked good. No tracheal stenosis.  -  Continue hydration and humidification  -  Continue  protonix twice a day. -  Keep head of bed elevated at all time -  High risk of aspiration  Diarrhea, resolved, C. difficile negative.  HYPERTENSION, blood pressure now low normal and hypertensive, -  Discontinue Norvasc and hold parameter for beta blocker Acute diastolic CHF (congestive heart failure), compensated  ESRD on dialysis, per nephrology Resolved non-convulsive status epilepticus, currently seizure free HYPERLIPIDEMIA, stable chronic GOUT, asymptomatic Muscular deconditioning, severe. 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 fib with RVR, respirations stable, possibly to skilled nursing in a  few days   Consultants:  Nephrology  CCM signed off  neurohospitalist signed off  ENT [facility] Cardiology Procedures:  ETT 5/29 >> 6/07  R [place] CVC 5/29 >> 6/12  Rt graft>>>  ETT 6/07 >> 6/11  Laryngoscopy 6/16  Antibiotics:  Vanco 5/27 >> 5/30  Cefepime 5/27 >> 5/30  Zosyn 5/28 x1 dose  Acyclovir 5/29 >> 5/31  HPI/Subjective:  Soft voice, denies chest pain, palpitations. States she does not have increased shortness of breath but does have nausea. No vomiting since starting hemodialysis. She developed A. fib with RVR with heart rate in the 150s to 160s and blood pressure in the 80s to 90s systolic at initiation of HD. Her blood pressure improved with a small fluid bolus. Cardiology consult is pending.  Objective: Filed Vitals:   07/26/12 0705 07/26/12 0730 07/26/12 0800 07/26/12 0828  BP: 169/63 94/56 132/62 135/66  Pulse: 82 165 162 165  Temp:      TempSrc:      Resp: 28 26 24 22   Height:      Weight: 54.9 kg (121 lb 0.5 oz)     SpO2:        Intake/Output Summary (Last 24 hours) at 07/26/12 0840 Last data filed at 07/25/12 2300  Gross per 24 hour  Intake    453 ml  Output      0 ml  Net    453 ml   Filed Weights   07/24/12 2126 07/25/12 2057 07/26/12 0705  Weight: 55.101 kg (121 lb 7.6 oz) 55.101 kg (121 lb 7.6 oz) 54.9 kg (121 lb 0.5 oz)    Exam:   General:  Caucasian female, No acute distress, ill appearing   HEENT:  NCAT, dry  MM  Cardiovascular:  IRRR, rate in the 140s to 160s,, nl S1, S2 no mrg, 2+ pulses, warm extremities  Respiratory:  Stridor, no rales heard anteriorly, no rhonchi, no wheeze, no increased WOB  Abdomen:  NABS, soft, NT/ND  MSK:  Normal tone and bulk, no LEE  Neuro:  Diffusely weak, able to move her fingers and toes, no obvious facial droop  Data Reviewed: Basic Metabolic Panel:  Recent Labs Lab 07/19/12 2330 07/21/12 0540 07/22/12 0622  NA 134* 137 133*  K 4.5 4.3 3.6  CL 94* 96 93*  CO2 27 27 23   GLUCOSE 119* 98 115*   BUN 79* 43* 72*  CREATININE 3.97* 3.13* 4.64*  CALCIUM 8.3* 8.4 8.1*  PHOS 5.7* 5.3* 4.3   Liver Function Tests:  Recent Labs Lab 07/19/12 2330 07/21/12 0540 07/22/12 0622  ALBUMIN 2.5* 2.7* 2.7*   No results found for this basename: LIPASE, AMYLASE,  in the last 168 hours No results found for this basename: AMMONIA,  in the last 168 hours CBC:  Recent Labs Lab 07/19/12 2330 07/21/12 0540 07/22/12 0622  WBC 16.9* 13.9* 19.7*  HGB 9.2* 10.2* 11.1*  HCT 27.8* 31.7* 32.9*  MCV 96.9 99.1 96.5  PLT 380 415* 448*   Cardiac Enzymes:  Recent Labs Lab 07/26/12 0801  TROPONINI <0.30   BNP (last 3 results)  Recent Labs  07/04/12 0724  PROBNP 45870.0*   CBG:  Recent Labs Lab 07/20/12 1945 07/20/12 2353 07/21/12 0356 07/21/12 0824 07/22/12 0908  GLUCAP 90 88 93 100* 127*    Recent Results (from the past 240 hour(s))  CLOSTRIDIUM DIFFICILE BY PCR     Status: None   Collection Time    07/21/12  3:03 PM      Result Value Range Status   C difficile by pcr NEGATIVE  NEGATIVE Final     Studies: No results found.  Scheduled Meds: . amLODipine  10 mg Oral QHS  . darbepoetin (ARANESP) injection - DIALYSIS  100 mcg Intravenous Q Tue-HD  . doxercalciferol  2 mcg Intravenous Q T,Th,Sa-HD  . feeding supplement  30 mL Oral BID  . metoprolol tartrate  25 mg Oral BID  . multivitamin  1 tablet Oral QHS  . pantoprazole  40 mg Oral BID  . 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 If 7PM-7AM, please contact night-coverage at [email or web address], password  [facility] 07/26/2012, 8:40 AM  LOS: 22 days

## 2012-07-26 NOTE — Progress Notes (Signed)
Family concern with PT tongue off to side. Neuro check was negative. PT grip =, and pupils reactive. Call Rapid R for second opinion

## 2012-07-26 NOTE — Progress Notes (Signed)
Saw note in the chart that the family was concerned that the patient's tongue had been deviating to one side and that rapid response was being called.    Family of patient was at bedside, including daughter and son who is healthcare power of attorney.  The patient apparently tolerated lunch well but this afternoon developed a right facial droop with tongue deviation also to the right. The family stated that she would continue to be diffusely weak at the time. They still notice some change to her face and she has difficulty getting the strength is even closer mouth. The patient states that she did not feel uncomfortable in any way but does feel very weak.  On exam she has pupils that are equal round and reactive to light. Her extraocular movements appear to be grossly intact however she has difficulty following my hand and following instructions. She is able to close her mouth but not strong enough to make a purse lip, her smile is symmetric. She has some mild tongue deviation possibly to the right but is symmetric palate.  She continues to have 3/5 strength throughout possibly more weak in the left upper and left lower extremities. She's not strong enough to do dysmetria testing.  Gait deferred because of weakness.    Diffuse weakness may be due to metabolic derangement, hypercapnia due to her stridor and respiratory compromise, myasthenia gravis.  Her strength does not appear to increase with repeat attempts.  Spoke with the family about the increased risk of stroke due to atrial fibrillation, however, at this time they do not want to pursue any more invasive testing. They're requesting that no further labs be drawn.   They would like to start heading towards comfort care.   -  At this time I did not discuss the possibility of discontinuing hemodialysis, but that may be indicated. -  If hemodialysis is discontinued, given her weakness and deconditioning I think that she may benefit from inpatient hospice  care

## 2012-07-26 NOTE — Procedures (Signed)
I was present at this dialysis session. I have reviewed the session itself and made appropriate changes.   Vinson Moselle, MD [employer] 07/26/2012, 9:25 AM

## 2012-07-26 NOTE — Progress Notes (Signed)
PAge PA Dunn. Pt back in NSR HR 70s, confirmed by EKG. PA state continue Amd as order. Nursing adjust 30mg /hr start to line up with 60mg /hr start time.

## 2012-07-26 NOTE — Progress Notes (Signed)
D/w dr. Lazarus Salines. High aspiration risk per ST. NGT attempted under fluoro x3, then patient refused further attempt.  Family discussion had.  DNR. Safest diet. No feeding tubes or TPN. Continue HD and otherwise aggressive care. If continues to decline, family will likely chose comfort care.

## 2012-07-26 NOTE — Progress Notes (Signed)
[facility] Progress Note  Subjective:   Feels bad.  Objective Filed Vitals:   07/26/12 0554 07/26/12 0646 07/26/12 0705 07/26/12 0730  BP: 187/71 173/70 169/63 94/56  Pulse: 77 83 82 165  Temp: 98.9 F (37.2 C) 97.9 F (36.6 C)    TempSrc: Oral Oral    Resp: 16 26 28 26   Height:      Weight:   54.9 kg (121 lb 0.5 oz)   SpO2: 96% 97%     Physical Exam on HD goal 3 L - taken out of UF due to BP drop General: weak, ill appearing Heart: tachy irreg rate 150-170 Lungs: diminished BS, upper airway stridor as yesterday Abdomen: soft Extremities: no LE edmea Dialysis Access: right upper AVF   Outpatient HD: [facility] 3.75hrs EDW 59kg Bath 2K/2.5CA F160 Heparin 2000 RUA AVF Hect 2ug EPO 2200   Assessment/Plan:   1. AMS/status epilepticus- felt due to cefipime toxicity; off Keppra, off dilantin; previously intubated  2. ESRD - outpt schedule TTS - for HD today (Wed) postponed from yesterday; labs pending 3. Anemia - Hgb increasing - 11.1 6/14 100 Aranesp q Tuesday; if 11.1 on Tuesday, will reduce dose to 60 - ^ ferritin - no Fe; CBC pending 4. MBD/CKD- ^ P slightly then down to 4.3 without binders - had not been on any binders PTA according to consult note by Dr. Eliott Nine, on hectorol 2; 5. HTN/volume - CXR 6/11: "Mild bibasilar air space disease with a small left pleural effusion. There may be a tiny right pleural effusion as well " ; on bid 25 mg metoprolol and norvasc 5 - UF 526 Sat and 2.4 on Thursday - needs pre and post HD weights  6. Nutrition - IR unable to place NG tube for supplemental feedings- pt declined after 2 attempts; family wishes no TF; was on D2 diet previously, will resume. - on multvit, prostat; Seen by ENT - has vocal corder ulceration related to intubation  7.  Tachyarrythmia- occurred on HD this am, pt asymptomatic - changed bath to 4 K pending lab results; EKG showed rapid afib with marked ST abnormality with poss inferior and anterolateral  subendocardial injury; [facility] called and will see pt soon. 7. Disp - NHP for rehab, DNR, no TF - if she continues to decline, family will pursue comfort care 8. PNA- original admit diagnosis  Sheffield Slider, PA-C [facility] Beeper [phone] 07/26/2012,7:55 AM  LOS: 22 days   Patient seen and examined.  I agree with plan as above with additions as indicated. Rapid HR on HD this am, seen by cardiology, prob afib with RVR. Given lopressor, HR 130's now and pt asymptomatic; to shorten HD and will get further Rx of arrythmia possibly with amio after HD completed.   Vinson Moselle  MD [phone] pgr    [phone] cell 07/26/2012, 9:22 AM   Additional Objective Labs: Basic Metabolic Panel:  Recent Labs Lab 07/19/12 2330 07/21/12 0540 07/22/12 0622  NA 134* 137 133*  K 4.5 4.3 3.6  CL 94* 96 93*  CO2 27 27 23   GLUCOSE 119* 98 115*  BUN 79* 43* 72*  CREATININE 3.97* 3.13* 4.64*  CALCIUM 8.3* 8.4 8.1*  PHOS 5.7* 5.3* 4.3   Liver Function Tests:  Recent Labs Lab 07/19/12 2330 07/21/12 0540 07/22/12 0622  ALBUMIN 2.5* 2.7* 2.7*  CBC:  Recent Labs Lab 07/19/12 2330 07/21/12 0540 07/22/12 0622  WBC 16.9* 13.9* 19.7*  HGB 9.2* 10.2* 11.1*  HCT 27.8* 31.7* 32.9*  MCV 96.9 99.1 96.5  PLT 380 415* 448*  CBG:  Recent Labs Lab 07/20/12 1945 07/20/12 2353 07/21/12 0356 07/21/12 0824 07/22/12 0908  GLUCAP 90 88 93 100* 127*  Medications:   . amLODipine  10 mg Oral QHS  . darbepoetin (ARANESP) injection - DIALYSIS  100 mcg Intravenous Q Tue-HD  . doxercalciferol  2 mcg Intravenous Q T,Th,Sa-HD  . feeding supplement  30 mL Oral BID  . metoprolol tartrate  25 mg Oral BID  . multivitamin  1 tablet Oral QHS  . pantoprazole  40 mg Oral BID  . sodium chloride  3 mL Intravenous Q12H

## 2012-07-26 NOTE — Consult Note (Signed)
CARDIOLOGY CONSULT NOTE  Patient ID: Christine Graham, MRN: [id number], DOB/AGE: 11-Apr-1925 79 y.o. Admit date: 07/04/2012   Date of Consult: 07/26/2012 Primary Physician: Judie Petit, MD Primary Cardiologist: Dr. Eden Emms saw patient last year  Chief Complaint: SOB on admission Reason for Consult: narrow complex tachycardia  HPI: Christine Graham is an 79 y/o F with history of ESRD, HTN, HLD, anemia of chronic disease and no prior cardiac history who presented to [facility] with SOB and nonproductive cough. CXR was concerning for bilateral infiltrates, PNA vs edema, WBC 14k. She was placed on antibiotic therapy with cefepime, Zosyn and vancomycin. She experienced a chance in mental status with progressive confusion/agitation leading to obtundation. Brain MRI 5/28 with old CVA's, nothing acute, EEG with epileptiform pattern and was treated with ativan and dilantin for non-convulsive seizure activity later deemed status epilepticus. She developed respiratory failure requiring intubation. It is felt at this point in time that cefepime is the culprit for her neurotoxicity. Neurology feels she has no signs on lab workup including LP for cause of status or encephalopathy. She has been extubated and has improved neurologically but MS still not at baseline. Steroids have been weaned. While in dialysis this morning, she was noted to go into a narrow complex tachycardia, rates 160's, SBP 94-132 systolic. EKG appears regular but she has also had bouts of irregularity on telemetry raising suspicion for atrial fibrillation, with ST-T changes. She has difficulty communicating but does not report any symptoms including CP, SOB, nausea, or sensation of racing heart.  From a cardiac standpoint, Dr. Eden Emms saw the patient in consultation in 2013 for possible atrial flutter - at that time he had reviewed tracings and felt she had only had NSR. Thus she has no formal cardiac history. 2D echo this admission is noted  below.  Past Medical History  Diagnosis Date  . Hyperlipidemia   . Hypertension   . Gout   . ESRD (end stage renal disease)     on HD.  Marland Kitchen Anemia       Most Recent Cardiac Studies: 2D Echo 06/2012 - Left ventricle: The cavity size was normal. Wall thickness was increased in a pattern of mild LVH. Systolic function was normal. The estimated ejection fraction was in the range of 55% to 60%. Wall motion was normal; there were no regional wall motion abnormalities. Features are consistent with a pseudonormal left ventricular filling pattern, with concomitant abnormal relaxation and increased filling pressure (grade 2 diastolic dysfunction). E/medial e' > 15 suggests LV end diastolic pressure at least 20 mmHg. - Aortic valve: Trileaflet; moderately calcified leaflets. Sclerosis without stenosis. - Mitral valve: Moderately calcified annulus. Moderate regurgitation. - Left atrium: The atrium was mildly dilated. - Right ventricle: The cavity size was normal. Systolic function was normal. - Right atrium: The atrium was mildly dilated. - Tricuspid valve: Peak RV-RA gradient: 29mm Hg (S). - Pulmonary arteries: PA systolic pressure 35-39 mmHg. - Systemic veins: IVC measured 1.9 cm with normal respirophasic variation, suggesting RA pressure 6-10 mmHg. - Pericardium, extracardiac: Small primarily posterior pericardial effusion. Impressions:  - Normal LV size with mild LV hypertrophy. EF 55-60% with moderate diastolic dysfunction and evidence for elevated LV filling pressure. Normal RV size and systolic function. Borderline pulmonary hypertension. Moderate central mitral regurgitation.    Surgical History:  Past Surgical History  Procedure Laterality Date  . Appendectomy    . Cholecystectomy    . Total hip arthroplasty    . Breast surgery  benign breast biopsy  . Av fistula placement  10/16/2010    (R) UA AVF  . Hip closed reduction  07/21/2011    Procedure: CLOSED REDUCTION  HIP;  Surgeon: Toni Arthurs, MD;  Location: [facility] OR;  Service: Orthopedics;  Laterality: Right;     Home Meds: Prior to Admission medications   Medication Sig Start Date End Date Taking? Authorizing Provider  acetaminophen (TYLENOL) 325 MG tablet Take 650 mg by mouth every 6 (six) hours as needed. For pain/fever   Yes Historical Provider, MD  ALPRAZolam (XANAX) 0.25 MG tablet Take 1 tablet (0.25 mg total) by mouth 2 (two) times daily as needed. For anxiety 06/12/12 06/12/13 Yes Bruce Romilda Garret, MD  amLODipine (NORVASC) 10 MG tablet Take 1 tablet (10 mg total) by mouth daily. 03/29/12  Yes Lindley Magnus, MD  amoxicillin (AMOXIL) 500 MG capsule Take 500 mg by mouth 4 (four) times daily.   Yes Historical Provider, MD  aspirin EC 81 MG tablet Take 81 mg by mouth daily.   Yes Historical Provider, MD  atenolol (TENORMIN) 50 MG tablet Take 50 mg by mouth daily.   Yes Historical Provider, MD  Multiple Vitamin (MULTIVITAMIN WITH MINERALS) TABS Take 1 tablet by mouth daily.   Yes Historical Provider, MD  rosuvastatin (CRESTOR) 20 MG tablet Take 20 mg by mouth daily.   Yes Historical Provider, MD    Inpatient Medications:  . amiodarone  150 mg Intravenous Once  . darbepoetin (ARANESP) injection - DIALYSIS  100 mcg Intravenous Q Tue-HD  . doxercalciferol  2 mcg Intravenous Q T,Th,Sa-HD  . feeding supplement  30 mL Oral BID  . metoprolol      . metoprolol  5 mg Intravenous Once  . metoprolol tartrate  25 mg Oral BID  . multivitamin  1 tablet Oral QHS  . pantoprazole  40 mg Oral BID  . sodium chloride  3 mL Intravenous Q12H   . amiodarone (NEXTERONE PREMIX) 360 mg/200 mL dextrose     Followed by  . amiodarone (NEXTERONE PREMIX) 360 mg/200 mL dextrose      Allergies:  Allergies  Allergen Reactions  . Cefepime Other (See Comments)    encephalopathy  . Codeine Nausea Only  . Oxycodone Nausea Only    History   Social History  . Marital Status: Widowed    Spouse Name: N/A    Number of Children:  N/A  . Years of Education: N/A   Occupational History  . Not on file.   Social History Main Topics  . Smoking status: Never Smoker   . Smokeless tobacco: Not on file  . Alcohol Use: No  . Drug Use: No  . Sexually Active:    Other Topics Concern  . Not on file   Social History Narrative  . No narrative on file     Family History  Problem Relation Age of Onset  . Heart disease Father     MI  . Arthritis Mother   . Stroke Mother      Review of Systems:limited given mental status. Denies CP, SOB, palpitations, sense of racing heart.  Labs:  Recent Labs  07/26/12 0801  TROPONINI <0.30   Lab Results  Component Value Date   WBC 16.4* 07/26/2012   HGB 11.1* 07/26/2012   HCT 32.9* 07/26/2012   MCV 96.5 07/26/2012   PLT 350 07/26/2012     Recent Labs Lab 07/26/12 0700  NA 129*  K 5.1  CL 89*  CO2 20  BUN 116*  CREATININE 6.92*  CALCIUM 7.8*  GLUCOSE 105*   Lab Results  Component Value Date   CHOL 104 07/05/2012   HDL 31* 07/05/2012   LDLCALC 52 07/05/2012   TRIG 107 07/05/2012    Radiology/Studies:  Dg Chest 2 View5/28/2014   *RADIOLOGY REPORT*  Clinical Data: Follow up shortness of breath  CHEST - 2 VIEW  Comparison: 07/04/2012  Findings: Normal heart size. There are low lung volumes.  Bilateral pleural effusions are identified and appear similar to previous exam.  There is moderate interstitial edema.  Review of the visualized osseous structures is significant for thoracic spondylosis.  IMPRESSION:  1.  Moderate CHF.  This is not significantly improved from previous exam.   Original Report Authenticated By: Signa Kell, M.D.   Dg Chest 2 View5/27/2014   *RADIOLOGY REPORT*  Clinical Data: Bilateral airspace disease versus edema check for improvement following dialysis  CHEST - 2 VIEW  Comparison: 07/04/2012 07:43 hours  Findings: Cardiac shadow is stable.  There is some left basilar infiltrate stable in appearance.  There has been some improvement in the degree of  central vascular congestion when compared with the prior exam.  IMPRESSION: Improved appearance consistent with edema improvement following dialysis. There are persistent bibasilar changes worst on the left.   Original Report Authenticated By: Alcide Clever, M.D.   Dg Chest 2 View5/27/2014   *RADIOLOGY REPORT*  Clinical Data: Shortness of breath and cough.  CHEST - 2 VIEW  Comparison: PA and lateral chest 07/05/2011.  Findings: The patient has small bilateral pleural effusions, larger on the left.  Bilateral airspace disease also appears worse on the left.  Heart size is upper normal.  No pneumothorax.  IMPRESSION: Bilateral airspace disease and small effusions, worse on the left, are likely due to asymmetric edema rather than pneumonia.   Original Report Authenticated By: Holley Dexter, M.D.   Ct Head Wo Contrast5/31/2014   *RADIOLOGY REPORT*  Clinical Data: Seizure-like activity.  Possible encephalopathy. The patient is not aroused a voice.,.  CT HEAD WITHOUT CONTRAST  Technique:  Contiguous axial images were obtained from the base of the skull through the vertex without contrast.  Comparison: MRI of the head without contrast 07/05/2012.  Findings: A remote right inferior cerebellar infarct is again noted.  Atrophy and moderate to periventricular white matter disease is evident bilaterally.  No acute cortical infarct, hemorrhage, or mass lesion is present.  There is no significant interval change.  The paranasal sinuses and mastoid air cells are clear.  The osseous skull is intact.  Note is made of hyperostosis frontalis internus.  IMPRESSION:  1.  Stable atrophy and diffuse nonspecific white matter disease. This could be related to a diffuse encephalopathy.  It is not significantly changed since 2009. 2.  Remote right PICA territory infarct.   Original Report Authenticated By: Marin Roberts, M.D.   Ct Head Wo Contrast5/28/2014   *RADIOLOGY REPORT*  Clinical Data: Altered mental status  CT HEAD  WITHOUT CONTRAST  Technique:  Contiguous axial images were obtained from the base of the skull through the vertex without contrast.  Comparison: Head CT 05/14/2007  Findings: The patient is moving within the scanner.  Repeat exams were performed with no significant improvement. The exam is suboptimal.  There is a hypodense lesion within the right cerebellum.  No intracranial hemorrhage.   There is a rounded lesion within the CSF space adjacent to the right aspect of the pons which is not  seen on prior.  There  is mild ventricular dilatation and periventricular white matter hypodensities  Paranasal sinuses and mastoid air cells are clear.  Orbits are normal.  IMPRESSION:  1.  Age indeterminate right cerebellar infarction.  This could represent an acute infarction. 2.  New high density rounded lesion right adjacent to the pons. This could represent the dolichoectasia or aneurysm of the vertebral artery. A meningioma is a secondary consideration.   Original Report Authenticated By: Genevive Bi, M.D.   Mr Brain Ilda Basset Contrast5/29/2014   *RADIOLOGY REPORT*  Clinical Data: Progressive mental status changes.  Abnormal CT scan.  MRI HEAD WITHOUT CONTRAST  Technique:  Multiplanar, multiecho pulse sequences of the brain and surrounding structures were obtained according to standard protocol without intravenous contrast.  Comparison: CT head without contrast 07/05/2012  Findings: The diffusion weighted images demonstrate no evidence for acute or subacute infarction.  The right inferior cerebellar infarcts are remote.  Moderate atrophy and bilateral periventricular white matter disease is evident bilaterally. Remote lacunar infarcts are present in the basal ganglia bilaterally. A remote lacunar infarct is evident in the left cerebellum.  No acute infarct, hemorrhage, or mass lesion is present.  The ventricles are proportionate to the degree of atrophy.  The globes and orbits are intact. The paranasal sinuses and mastoid air  cells are clear.  IMPRESSION:  1.  No acute intracranial abnormality. 2.  The right inferior cerebellar infarct is remote. 3.  Advanced atrophy and white matter disease likely reflects the sequelae of chronic microvascular ischemia in this patient with hypertension and end-stage renal disease. 4.  Remote lacunar infarcts of the basal ganglia and left cerebellum.   Original Report Authenticated By: Marin Roberts, M.D.   Dg Chest Port 1 View6/12/2012   *RADIOLOGY REPORT*  Clinical Data: Atelectasis.  PORTABLE CHEST - 1 VIEW  Comparison: 07/18/2012.  Findings: Endotracheal tube is in satisfactory position. Nasogastric tube is followed into the stomach.  Right subclavian central line tip projects over the SVC.  Heart size normal. Thoracic aorta is calcified.  Mild bibasilar air space disease with a small left pleural effusion.  There may be a tiny right pleural effusion as well.  IMPRESSION: Mild bibasilar air space disease and small left pleural effusion. Possible tiny right pleural effusion as well.   Original Report Authenticated By: Leanna Battles, M.D.   Dg Chest Port 1 View6/11/2012   *RADIOLOGY REPORT*  Clinical Data: Follow up atelectasis  PORTABLE CHEST - 1 VIEW  Comparison: Prior chest x-ray 07/17/2012  Findings: The tip of the endotracheal tube is less than 1 cm above the carina and is directed at the right mainstem bronchus.  The right subclavian approach central venous catheter is in unchanged position with the tip in the mid SVC.  Nasogastric tube is incompletely imaged, the tip lies below the field of view likely within the stomach.  Stable cardiomegaly.  No pneumothorax identified.  Small left pleural effusion and associated atelectasis.  Pulmonary vascular congestion is similar to slightly improved.  IMPRESSION:  1. The tip of the endotracheal tube is just above the carina and directed toward the right mainstem bronchus.  Recommend withdrawing 2 - 3 cm for more optimal placement. 2.  Similar  to slightly improved pulmonary vascular congestion 3.  Small left pleural effusion and associated atelectasis.  These results were called by telephone on 07/18/2012 at 07:45 a.m. to the patient's nurse, Leotis Shames,, who verbally acknowledged these results.   Original Report Authenticated By: Malachy Moan, M.D.   Dg Chest Port 1 View6/10/2012   *  RADIOLOGY REPORT*  Clinical Data: Evaluate atelectasis  PORTABLE CHEST - 1 VIEW  Comparison: 07/16/2012; 07/15/2012  Findings: Grossly unchanged cardiac silhouette and mediastinal contours given patient rotation.  Stable position of support apparatus.  No pneumothorax.  Grossly unchanged bibasilar heterogeneous opacity.  No new focal airspace opacity.  Mild pulmonary congestion without frank evidence of edema.  Trace right- sided pleural effusion is not excluded.  Unchanged bones.  IMPRESSION: 1.  Stable positioning of support apparatus.  No pneumothorax. 2.  Grossly unchanged findings of mild pulmonary venous congestion and bibasilar opacities, atelectasis versus infiltrate.   Original Report Authenticated By: Tacey Ruiz, MD   Dg Chest Port 1 View/09/2012   *RADIOLOGY REPORT*  Clinical Data: Follow up atelectasis  PORTABLE CHEST - 1 VIEW  Comparison: Prior chest x-ray 07/15/2012  Findings: The tip of the endotracheal tube is 1 cm above the carina. A nasogastric tube is in place.  The tip lies below the diaphragm off the field of view, presumably within the stomach. Right subclavian approach central venous catheter in stable position with the tip in the mid SVC.  Stable appearance of the chest with persistent low inspiratory volumes and left greater than right retrocardiac opacities which could reflect atelectasis versus consolidation.  A prominent skin fold in the right upper lung gives a pseudo pneumothorax appearance.  No acute osseous abnormality.  IMPRESSION:  1.  A nasogastric tube is in place.  The tip lies below the diaphragm off the field of view, presumably  within the stomach.  2. The tip of the endotracheal tube is low at 1 cm above the carina.    Consider retracting 2 cm for more optimal placement.  3.  Stable appearance of the lungs with left greater than right bibasilar retrocardiac opacities which could reflect atelectasis or infiltrate.   Original Report Authenticated By: Malachy Moan, M.D.   Dg Chest Port 1 View6/08/2012   *RADIOLOGY REPORT*  Clinical Data: Evaluate endotracheal tube placement.  PORTABLE CHEST - 1 VIEW  Comparison: Chest x-ray 07/15/2012.  Findings: An endotracheal tube is in place with tip 3.1 cm above the carina. There is a right-sided subclavian central venous catheter with tip terminating in the mid superior vena cava. Lung volumes are low.  Opacity in the medial aspect of the left bases similar to the prior study and may reflect some atelectasis and/or consolidation.  No pleural effusions.  No evidence of pulmonary edema.  Heart size is normal.  Mediastinal contours are unremarkable.  Atherosclerosis of the thoracic aorta.  IMPRESSION: 1.  Support apparatus, as above. 2.  Low lung volumes with atelectasis and/or consolidation in the medial aspect of left lower lobe. 3.  Atherosclerosis.   Original Report Authenticated By: Trudie Reed, M.D.   Dg Chest Port 1 View6/08/2012   *RADIOLOGY REPORT*  Clinical Data: Assess right base atelectasis  PORTABLE CHEST - 1 VIEW  Comparison: 07/14/2012  Findings: Endotracheal tube terminates 2.5 cm above the carina.  Small bilateral pleural effusions.  Associated basilar opacities, left greater than right, likely atelectasis.  No pneumothorax.  The heart is normal in size.  Stable right subclavian venous catheter and enteric tube.  IMPRESSION: Endotracheal tube terminates 2.5 cm above the carina.  Small bilateral pleural effusions.  Associated bibasilar opacities, likely atelectasis, left greater than right.   Original Report Authenticated By: Charline Bills, M.D.   Dg Chest Port 1 View6/07/2012    *RADIOLOGY REPORT*  Clinical Data: Evaluate endotracheal tube position  PORTABLE CHEST - 1 VIEW  Comparison: Portable chest x-ray of 07/12/2012  Findings:  The tip of the endotracheal tube appears to be somewhat near the carina, only 1.6 cm above the expected carina. There is little change in haziness at the lung bases consistent with atelectasis and effusions.  Mild cardiomegaly is stable with minimal pulmonary vascular congestion.  The right central venous line is unchanged in position with the tip overlying the lower SVC.  IMPRESSION:  1.  Tip of endotracheal tube approximately 1.6 cm above the carina. 2.  Little change in basilar atelectasis and effusions.   Original Report Authenticated By: Dwyane Dee, M.D.   Dg Chest Port 1 View 07/12/2012   *RADIOLOGY REPORT*  Clinical Data: Follow up respiratory failure  PORTABLE CHEST - 1 VIEW  Comparison: 08/07/2012  Findings: The endotracheal tube tip is stable above the carina. There is a right subclavian catheter with tip in the projection of the cavoatrial junction.  Nasogastric tube is coiled in the stomach.  Lung volumes are decreased.  Small bilateral pleural effusions and pulmonary venous congestion persist.  IMPRESSION:  1.  Stable support apparatus. 2.  No change in small effusions and pulmonary venous congestion.   Original Report Authenticated By: Signa Kell, M.D.   Dg Chest Port 1 View 07/11/2012   *RADIOLOGY REPORT*  Clinical Data: Respiratory failure, follow-up  PORTABLE CHEST - 1 VIEW  Comparison: Portable chest x-ray of 07/10/2012  Findings: The tip of the endotracheal tube is very near the carina only 6 mm above, and withdrawing the endotracheal tube by 2 cm is recommended.  Left basilar opacity persists although aeration has improved slightly.  Also there appears to be a small left pleural effusion present.  The right central venous line tip is unchanged in position and an NG tube courses below the hemidiaphragm.  IMPRESSION:  1.  Slightly better  aeration. 2.  Persistent left basilar opacity consistent with atelectasis, effusion, and possibly pneumonia. 3.  Tip of endotracheal tube very near the carina as noted above. Recommend withdrawing by 2 cm.   Original Report Authenticated By: Dwyane Dee, M.D.   Dg Chest Port 1 View  07/10/2012   *RADIOLOGY REPORT*  Clinical Data: Respiratory failure.  PORTABLE CHEST - 1 VIEW  Comparison: Chest 07/09/2012 and 07/08/2012.  Findings: Support tubes and lines are unchanged.  Bilateral effusions airspace disease persist and appear slightly increased. There is no pneumothorax.  Heart size is normal.  The patient is rotated on the study.  IMPRESSION: No change in small to moderate bilateral pleural effusions and basilar airspace disease.   Original Report Authenticated By: Holley Dexter, M.D.   Dg Chest Portable 1 View  07/09/2012   *RADIOLOGY REPORT*  Clinical Data: Edema versus pneumonia  PORTABLE CHEST - 1 VIEW  Comparison:   the previous day's study  Findings: Endotracheal tube, nasogastric tube, and right subclavian central line are stable in position.  Infrahilar infiltrate or atelectasis, left greater than right, without convincing change from previous exam.  There may be small pleural effusions.  Heart size remains normal.  Spurring in the thoracic spine.  IMPRESSION:  1.  Little change in asymmetric bibasilar atelectasis or infiltrates with possible small effusions. 2. Support hardware stable in position.   Original Report Authenticated By: D. Andria Rhein, MD   Dg Chest Port 1 View  07/08/2012   *RADIOLOGY REPORT*  Clinical Data: Intubated  PORTABLE CHEST - 1 VIEW  Comparison: 07/06/2012  Findings: Endotracheal tube tip 2.3 cm above carina.  Nasogastric tube and  right subclavian central line are stable in position. Persistent perihilar and left retrocardiac hazy infiltrates or atelectasis.  Suspect small pleural effusions.  Heart size remains normal.  Atheromatous aorta.  IMPRESSION:  1.  Little change    since previous day's portable chest exam   Original Report Authenticated By: D. Andria Rhein, MD   Dg Chest Port 1 View  07/06/2012   **ADDENDUM** CREATED: 07/06/2012 17:06:49  Additionally, there is an orogastric tube present, the tip of which appears coiled back on itself in the proximal stomach.  **END ADDENDUM** SIGNED BY: Florencia Reasons, M.D.  07/06/2012   *RADIOLOGY REPORT*  Clinical Data: Central line placement.  PORTABLE CHEST - 1 VIEW  Comparison: Chest x-ray 07/06/2012.  Findings: An endotracheal tube is in place with tip 3.9 cm above the carina. There is a right-sided subclavian central venous catheter with tip terminating in the distal superior vena cava. No pneumothorax.  Bibasilar opacities may reflect areas of atelectasis and/or consolidation.  Small bilateral pleural effusions.  No evidence of pulmonary edema.  Heart size is within normal limits. Upper mediastinal contours are unremarkable.  Atherosclerosis in the thoracic aorta.  IMPRESSION: 1.  Support apparatus, as above.  No pneumothorax following right subclavian central venous catheter placement. 2.  Persistent bibasilar atelectasis and/or consolidation with small bilateral pleural effusions. 3.  Atherosclerosis.   Original Report Authenticated By: Trudie Reed, M.D.   Dg Chest Port 1 View  07/06/2012   *RADIOLOGY REPORT*  Clinical Data: Status post intubation.  PORTABLE CHEST - 1 VIEW  Comparison: Chest x-ray 07/05/2012.  Findings: An endotracheal tube is in place with tip 3.1 cm above the carina. Lung volumes are normal.  Worsening bibasilar opacities, which may represent areas of atelectasis and/or consolidation, with superimposed moderate right and small left pleural effusions.  Pulmonary vasculature now appears within normal limits.  Heart size is borderline enlarged.  Mediastinal contours are unremarkable.  Atherosclerosis of the thoracic aorta.  IMPRESSION: 1.  Tip of endotracheal tube appears properly located 3.1 cm above  the carina. 2.  Interval resolution of pulmonary edema. 3.  Worsening bibasilar aeration, which may reflect increasing areas of atelectasis and/or consolidation, with increasing moderate right and small left-sided pleural effusions. 4.  Atherosclerosis.   Original Report Authenticated By: Trudie Reed, M.D.   Dg Abd Portable 1v  07/15/2012   *RADIOLOGY REPORT*  Clinical Data: NG tube placement.  PORTABLE ABDOMEN - 1 VIEW  Comparison: Chest x-ray 07/15/2012  Findings: Orogastric tube is in place, tip overlying the level of the duodenum, likely within the region of the bulb.  Bowel gas pattern is nonobstructive.  There are significant degenerative changes in the spine.  The patient has had previous right hip arthroplasty.  IMPRESSION: Orogastric tube tip overlying the level of the proximal duodenum.   Original Report Authenticated By: Norva Pavlov, M.D.   Dg Fluoro Guide Lumbar Puncture  07/06/2012   *RADIOLOGY REPORT*  Clinical Data:  Altered mental status.  DIAGNOSTIC LUMBAR PUNCTURE UNDER FLUOROSCOPIC GUIDANCE  Fluoroscopy time:  1 minute, 29 seconds.  Technique:  Informed consent was obtained from the patient prior to the procedure, including potential complications of headache, allergy, and pain.   With the patient prone, the lower back was prepped with Betadine.  1% Lidocaine was used for local anesthesia. Lumbar puncture was performed at the L3-4 level using a 20 gauge needle with return of clear CSF with an opening pressure of 10 cm water.   8 ml of CSF were obtained  for laboratory studies.  The patient tolerated the procedure well and there were no apparent complications.  IMPRESSION: Technically successful fluoro guided lumbar puncture as above.   Original Report Authenticated By: Charlett Nose, M.D.    EKG: narrow complex tachycardia 164bpm with significant ST depression inferolaterally ? SVT but telemetry looks like atrial fibrillation  Physical Exam: Blood pressure 135/66, pulse 165,  temperature 97.9 F (36.6 C), temperature source Oral, resp. rate 22, height 5' 6.93" (1.7 m), weight 121 lb 0.5 oz (54.9 kg), SpO2 97.00%. General: Ill appearing WF in no acute distress. Head: Normocephalic, atraumatic, sclera non-icteric, no xanthomas, nares are without discharge.  Neck: JVD not elevated. Lungs: Coarse BS upper airway. No wheezes, rales, or rhonchi. Breathing is unlabored. Heart: Tachy, irregular with S1 S2. No murmurs, rubs, or gallops appreciated. Abdomen: Soft, non-tender, non-distended with normoactive bowel sounds. No hepatomegaly. No rebound/guarding. No obvious abdominal masses. Msk:  Strength and tone appear normal for age. Extremities: No clubbing or cyanosis. No edema.  Distal pedal pulses are 2+ and equal bilaterally. Neuro: Alert and oriented to self but does not know date, unintelligible when stating where she is. Cannot provide more than just a few word answers, does a lot of staring. Psych:  Flat affect.   Assessment and Plan:   1. Rapid narrow complex tachycardia ? Atrial fibrillation with ST-T changes 2. Obtundation/AMS/status epilepticus felt due to neurotoxicity from cefepime used to treat HCAP, with persistent mental status changes 3. Acute respiratory failure, now extubated 4. ESRD on HD 5. H/o HTN with fluctuating BP during dialysis (94-132 SBP with tachycardia) 6. Severe deconditioning 7. Stridor/aphonia being seen by ENT for vocal cord trauma 8. Leukocytosis 9. Anemia of chronic disease 10. DNR  Pt seen and examined with Dr. Gala Romney. The patient is asymptomatic but advise control of her HR. Tried Lopressor 5mg  x 1. This lowered HR slightly from 150-160s to 130's-140's, but tachy persisted and she has had labile BP while in HD with this rhythm. Will initiate IV amiodarone bolus and drip. Per nursing report, amiodarone cannot be started in dialysis thus session will need to be cut short and pt transferred to floor - renal PA aware. ST-T changes  likely reflect demand ischemia at this point but with other ongoing issues would not pursue troponins or anticoagulation. Lytes per renal. Treat underlying illness.   Signed, Ronie Spies PA-C 07/26/2012, 9:07 AM   Patient seen and examined with Ronie Spies, PA-C. We discussed all aspects of the encounter. I agree with the assessment and plan as stated above.   On ECG rhythm looked like possible SVT but on monitor it is clearly atrial fibrillation with significant ST depression. however she denies CP or other ischemic sx. There was no response to carotid massage and minimal response to IV lopressor. Given comorbidities, IV amiodarone is the best option. I suspect this will only be short term. If she converts and recovers from her short term illness I would try to stop amio prior to d/c. Agree with not cycling cardiac markers or initiating anticoagulation as she is not cath candidate and risk of bleeding is high.   Daniel Bensimhon,MD 12:11 PM

## 2012-07-27 MED ORDER — LABETALOL HCL 5 MG/ML IV SOLN
10.0000 mg | INTRAVENOUS | Status: DC | PRN
  Filled 2012-07-27: qty 4

## 2012-07-27 MED ORDER — AMLODIPINE BESYLATE 10 MG PO TABS
10.0000 mg | ORAL_TABLET | Freq: Every day | ORAL | Status: DC
  Administered 2012-07-27 – 2012-08-02 (×6): 10 mg via ORAL
  Filled 2012-07-27 (×7): qty 1

## 2012-07-27 MED ORDER — ANTIPYRINE-BENZOCAINE 5.4-1.4 % OT SOLN
3.0000 [drp] | OTIC | Status: DC | PRN
  Administered 2012-07-27: 3 [drp] via OTIC
  Administered 2012-07-28: 4 [drp] via OTIC
  Administered 2012-07-29 – 2012-07-31 (×2): 3 [drp] via OTIC
  Administered 2012-07-31: 4 [drp] via OTIC
  Administered 2012-07-31 – 2012-08-01 (×2): 3 [drp] via OTIC
  Filled 2012-07-27: qty 10

## 2012-07-27 NOTE — Progress Notes (Signed)
Pt more alert today. Pt able to carry on conversation with Nurse

## 2012-07-27 NOTE — Progress Notes (Addendum)
[facility] PROGRESS NOTE  GIOVANNA KEMMERER NWG:[id number] DOB: November 12, 1925 DOA: 07/04/2012 PCP: Judie Petit, MD  Assessment/Plan  New onset atrial fibrillation with RVR and relative hypotension.  The patient reverted to normal sinus rhythm after initiation of amiodarone infusion. Troponin was negative and TSH was within normal limits.  CHAD2vasc score is high and patient would qualify for anticoagulation, however given her comorbidities and discussions about palliative care, will hold off at this time.    Postextubation stridor. Dr. Lazarus Salines found ulceration with eschar over her right arytenoid with confluent ulceration extending into the subglottic posterior commissure. Small non-confluent ulceration of the left arytenoid. Vocal cords were mobile and airway looked good. No tracheal stenosis.  -  Continue protonix -  Keep head of bed elevated at all time -  High risk of aspiration, speech therapy discussed this with the family  Diarrhea, resolved, C. difficile negative.   HYPERTENSION, hold Norvasc and placed parameter for metoprolol yesterday due to hypotension. Blood pressures are now elevated. -  Restart Norvasc  Acute diastolic CHF (congestive heart failure), compensated   ESRD on dialysis, per nephrology.  Discussed with family that the patient is likely a poor candidate for ongoing hemodialysis if she is not strong enough to sit up in a chair for an extended period of time.  The family has requested ongoing hemodialysis while they discuss further goals of care. -  Palliative care consult  Resolved non-convulsive status epilepticus, currently seizure free HYPERLIPIDEMIA, stable chronic GOUT, asymptomatic Muscular deconditioning, severe. 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  No evidence of acute otitis media.  Possible a serous effusion. -  Auralgan  Diet:  Dysphagia 2 with nectar thin  liquids Access:  Peripheral IV IVF:  Off Proph:  SCDs  Code Status: DO NOT RESUSCITATE Family Communication: Spoke with patient and her son, HPOA Disposition Plan:  Patient will need to attend hemodialysis again tomorrow.  Needed to determine her level of strength and conditioning and find that if she would be able to continue dialysis as an outpatient.     Consultants:  Nephrology  CCM signed off  neurohospitalist signed off  [facility] Cardiology Procedures:  ETT 5/29 >> 6/07  R Hollins CVC 5/29 >> 6/12  Rt graft>>>  ETT 6/07 >> 6/11  Laryngoscopy 6/16  Antibiotics:  Vanco 5/27 >> 5/30  Cefepime 5/27 >> 5/30  Zosyn 5/28 x1 dose  Acyclovir 5/29 >> 5/31  HPI/Subjective:  The patient states that she feels well. She feels like she is a little stronger today than she was yesterday. Her Josph [place]-term memory is poor. The speech therapist told me that she had aspiration with almost all consistencies of food and liquid and that they had discussed this with the family. The patient denied that she had any aspiration problems when I asked.  She denies chest pain, shortness of breath, nausea, vomiting. She may have had a loose stool this morning.    Objective: Filed Vitals:   07/26/12 1451 07/26/12 2128 07/27/12 0703 07/27/12 0903  BP: 176/45 155/38 162/41   Pulse:  67 64 70  Temp:  99.2 F (37.3 C) 98.6 F (37 C)   TempSrc:  Oral Oral   Resp:  23 24   Height:      Weight:   54.5 kg (120 lb 2.4 oz)   SpO2:  97% 97%     Intake/Output Summary (Last 24 hours) at 07/27/12  1314 Last data filed at 07/27/12 2130  Gross per 24 hour  Intake 2257.32 ml  Output      0 ml  Net 2257.32 ml   Filed Weights   07/26/12 1005 07/26/12 1409 07/27/12 0703  Weight: 54.6 kg (120 lb 5.9 oz) 54.5 kg (120 lb 2.4 oz) 54.5 kg (120 lb 2.4 oz)    Exam:   General:  Caucasian female, No acute distress  HEENT:  NCAT, MMM.  TMs pearly bilaterally without erythema or purulence.  Possible mild serous  effusion on the right.    Cardiovascular:  RRR, nl S1, S2 no mrg, 2+ pulses, warm extremities  Respiratory:  Stridor, no rales heard anteriorly, no rhonchi, no wheeze, no increased WOB  Abdomen:  NABS, soft, NT/ND  MSK:  Normal tone and bulk, no LEE  Neuro:  Diffusely weak but able to lift her arms off the bed today and her grip is now 4 minus out of 5 bilaterally, able to move her fingers and toes, no obvious facial droop  Data Reviewed: Basic Metabolic Panel:  Recent Labs Lab 07/21/12 0540 07/22/12 0622 07/26/12 0700  NA 137 133* 129*  K 4.3 3.6 5.1  CL 96 93* 89*  CO2 27 23 20   GLUCOSE 98 115* 105*  BUN 43* 72* 116*  CREATININE 3.13* 4.64* 6.92*  CALCIUM 8.4 8.1* 7.8*  MG  --   --  2.0  PHOS 5.3* 4.3 6.9*   Liver Function Tests:  Recent Labs Lab 07/21/12 0540 07/22/12 0622 07/26/12 0700  ALBUMIN 2.7* 2.7* 2.8*   No results found for this basename: LIPASE, AMYLASE,  in the last 168 hours No results found for this basename: AMMONIA,  in the last 168 hours CBC:  Recent Labs Lab 07/21/12 0540 07/22/12 0622 07/26/12 0700  WBC 13.9* 19.7* 16.4*  HGB 10.2* 11.1* 11.1*  HCT 31.7* 32.9* 32.9*  MCV 99.1 96.5 96.5  PLT 415* 448* 350   Cardiac Enzymes:  Recent Labs Lab 07/26/12 0801  TROPONINI <0.30   BNP (last 3 results)  Recent Labs  07/04/12 0724  PROBNP 45870.0*   CBG:  Recent Labs Lab 07/20/12 1945 07/20/12 2353 07/21/12 0356 07/21/12 0824 07/22/12 0908  GLUCAP 90 88 93 100* 127*    Recent Results (from the past 240 hour(s))  CLOSTRIDIUM DIFFICILE BY PCR     Status: None   Collection Time    07/21/12  3:03 PM      Result Value Range Status   C difficile by pcr NEGATIVE  NEGATIVE Final     Studies: Dg Chest Port 1 View  07/26/2012   *RADIOLOGY REPORT*  Clinical Data: Atrial fibrillation  PORTABLE CHEST - 1 VIEW  Comparison: 07/19/2012  Findings: The cardiac shadow is stable.  The lungs are well-aerated bilaterally without evidence  of focal infiltrate.  No acute bony abnormality is seen.  IMPRESSION: No acute abnormality noted.   Original Report Authenticated By: Alcide Clever, M.D.    Scheduled Meds: . doxercalciferol  2 mcg Intravenous Q T,Th,Sa-HD  . feeding supplement  1 Container Oral TID BM  . feeding supplement  30 mL Oral BID  . metoprolol tartrate  25 mg Oral BID  . multivitamin  1 tablet Oral QHS  . pantoprazole  40 mg Oral BID  . sodium chloride  3 mL Intravenous Q12H   Continuous Infusions: . sodium chloride 10 mL/hr (07/26/12 1635)  . 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 If 7PM-7AM, please contact night-coverage at [email or web address], password [facility] 07/27/2012, 1:14 PM  LOS: 23 days

## 2012-07-27 NOTE — Progress Notes (Signed)
Page MD SBP >190/ Md order PRN. PRN not giving at this time d/t recheck SBP 178. Will continue to monitor

## 2012-07-27 NOTE — Progress Notes (Signed)
  [facility] Progress Note  Subjective:   Very confused per family (dtr) who is at bedside. Pt calm and interactive, but disoriented  Objective Filed Vitals:   07/26/12 1451 07/26/12 2128 07/27/12 0703 07/27/12 0903  BP: 176/45 155/38 162/41   Pulse:  67 64 70  Temp:  99.2 F (37.3 C) 98.6 F (37 C)   TempSrc:  Oral Oral   Resp:  23 24   Height:      Weight:   54.5 kg (120 lb 2.4 oz)   SpO2:  97% 97%    Physical Exam on HD goal 3 L - taken out of UF due to BP drop General: weak, ill appearing Heart: tachy irreg rate 150-170 Lungs: diminished BS no rales Abdomen: soft Extremities: no LE edmea Dialysis Access: right upper AVF patent  Outpatient HD: NW TTS 3.75hrs EDW 59kg Bath 2K/2.5CA F160 Heparin 2000 RUA AVF Hect 2ug EPO 2200   Assessment/Plan:   1. Afib w RVR- in sinus rhythm now, per cards 2. AMS/ status epilepticus- felt due to cefipime toxicity; off Keppra, off dilantin; previously intubated  3. ESRD - off schedule, plan HD Fri and Sat 4. Anemia - Hgb over 11, holding ESA - ^ ferritin - no Fe;  5. MBD/CKD- ^ P slightly then down to 4.3 without binders - had not been on any binders PTA according to consult note by Dr. Eliott Nine, on hectorol 2; 6. HTN/volume - 5kg under prior dry weight, bid 25 mg metoprolol and norvasc 5, BP's normal and cxr clear yest. Plan UF 2.5 kg w HD tomorrow 7. Nutrition - IR unable to place NG tube for supplemental feedings- pt declined after 2 attempts; family wishes no TF; was on D2 diet previously, will resume. 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 PNA- original admit diagnosis 9. DNR- pt now full DNR    Additional Objective Labs: Basic Metabolic Panel:  Recent Labs Lab 07/21/12 0540 07/22/12 0622 07/26/12 0700  NA 137 133* 129*  K 4.3 3.6 5.1  CL 96 93* 89*  CO2 27 23 20   GLUCOSE 98 115* 105*  BUN 43* 72* 116*  CREATININE 3.13* 4.64* 6.92*  CALCIUM 8.4 8.1* 7.8*   PHOS 5.3* 4.3 6.9*   Liver Function Tests:  Recent Labs Lab 07/21/12 0540 07/22/12 0622 07/26/12 0700  ALBUMIN 2.7* 2.7* 2.8*  CBC:  Recent Labs Lab 07/21/12 0540 07/22/12 0622 07/26/12 0700  WBC 13.9* 19.7* 16.4*  HGB 10.2* 11.1* 11.1*  HCT 31.7* 32.9* 32.9*  MCV 99.1 96.5 96.5  PLT 415* 448* 350  CBG:  Recent Labs Lab 07/20/12 1945 07/20/12 2353 07/21/12 0356 07/21/12 0824 07/22/12 0908  GLUCAP 90 88 93 100* 127*  Medications: . sodium chloride 10 mL/hr (07/26/12 1635)  . amiodarone (NEXTERONE PREMIX) 360 mg/200 mL dextrose 30 mg/hr (07/27/12 0006)   . darbepoetin (ARANESP) injection - DIALYSIS  100 mcg Intravenous Q Tue-HD  . doxercalciferol  2 mcg Intravenous Q T,Th,Sa-HD  . feeding supplement  1 Container Oral TID BM  . feeding supplement  30 mL Oral BID  . metoprolol tartrate  25 mg Oral BID  . multivitamin  1 tablet Oral QHS  . pantoprazole  40 mg Oral BID  . sodium chloride  3 mL Intravenous Q12H

## 2012-07-27 NOTE — Progress Notes (Signed)
Speech Language Pathology Dysphagia Treatment Patient Details Name: Christine Graham MRN: [id number] DOB: 10/18/1925 Today's Date: 07/27/2012 Time: 1030-1100 SLP Time Calculation (min): 30 min  Assessment / Plan / Recommendation Clinical Impression  Events since 6/17 FEES exam reviewed.   NG placement under fluoro was not successful; after discussion with MD, family decided on DNR status, no TF nor TPN, safest possible diet.  Diet orders for dysphagia 2, nectars resumed.  Observed pt today finishing breakfast - drinking thin water and coffee with overt signs of aspiration (coughing, wet/congested vocal quality.)  Reviewed with pt and significant other the risks of aspiration, particularly with thin liquids, and that nectar-thicks pose the least risk of aspiration but may still enter airway.  Pt asserts understanding but remains confused and not fully able to grasp rationale; expresses her desire for coffee and water.   Given family decision, recommend continuing Dysphagia 2/nectars as the best option in circumstances that offer suboptimal choices.  Precautions posted HOB and situation discussed with Dr. Malachi Bonds.      Diet Recommendation  Continue with Current Diet: Dysphagia 2 (fine chop);Nectar-thick liquid    SLP Plan Continue with current plan of care      Swallowing Goals  SLP Swallowing Goals Patient will utilize recommended strategies during swallow to increase swallowing safety with: Supervision/safety;Minimal assistance Swallow Study Goal #2 - Progress: Progressing toward goal Goal #3: Pt. will complete vairous exercises designed to improve oropharyngeal strength for swallow function, with moderate assist. Swallow Study Goal #3 - Progress: Progressing toward goal Goal #4: Pt. and family will verbalize/demonstrate steps and rationale for Fraizer water protocol prior to allowing occasional ice chip. Swallow Study Goal #4 - Progress: Not met  General Temperature Spikes Noted:  No Respiratory Status:  (bilateral rhonchi) Behavior/Cognition: Alert;Cooperative;Confused Oral Cavity - Dentition: Adequate natural dentition Patient Positioning: Upright in bed  Oral Cavity - Oral Hygiene Patient is HIGH RISK - Oral Care Protocol followed (see row info): Yes   Dysphagia Treatment Treatment focused on: Skilled observation of diet tolerance;Patient/family/caregiver education Family/Caregiver Educated: Significant other Treatment Methods/Modalities: Skilled observation Patient observed directly with PO's: Yes Type of PO's observed: Dysphagia 2 (chopped);Nectar-thick liquids;Thin liquids Feeding: Able to feed self Liquids provided via: Cup;Teaspoon Pharyngeal Phase Signs & Symptoms: Immediate cough Type of cueing: Verbal;Tactile Amount of cueing: Minimal   GO     [name] 07/27/2012, 11:20 AM

## 2012-07-27 NOTE — Progress Notes (Signed)
Pt. A/Ox2 on 2 L [place] of oxygen. She still has continuous IV fluids running. Daughter at bedside and is supportive. Pt.sleep with throughout the night with no signs of distress or discomfort.

## 2012-07-27 NOTE — Progress Notes (Signed)
Palliative Medicine Consult for goals of care discussion received; spoke with patient's son Arlys John and daughter, Jasmine December away from bedside at their request; Arlys John and Jasmine December stated they want to wait to see how patient tolerated HD- planned for tomorrow- before arranging a meeting time with PMT; PMT contact information given to son Arlys John and he will call team to arrange meeting after talking with primary team and seeing how his mother responds to HD tomorrow. Await call back from family to schedule meeting time.   Valente David, RN 07/27/2012, 5:01 PM Palliative Medicine Team RN Liaison [phone]

## 2012-07-27 NOTE — Progress Notes (Signed)
Patient ID: Christine Graham, female   DOB: December 04, 1925, 79 y.o.   MRN: [id number] PROGRESS NOTE  LASTACIA SOLUM ZHY:[id number] DOB: 09-22-1925 DOA: 07/04/2012 PCP: Judie Petit, MD  Subjective:  Obtunded and lethargic this am   Objective: Filed Vitals:   07/24/12 1937 07/24/12 2126 07/25/12 0550 07/25/12 0919  BP: 184/61 185/67 147/66 167/58  Pulse:  75 66 72  Temp: 98.3 F (36.8 C) 98.7 F (37.1 C) 97.8 F (36.6 C) 98.1 F (36.7 C)  TempSrc: Oral Oral Oral   Resp: 18 18 18 17   Height:      Weight:  55.101 kg (121 lb 7.6 oz)    SpO2: 100% 97% 96% 100%    Intake/Output Summary (Last 24 hours) at 07/25/12 1017 Last data filed at 07/25/12 0944  Gross per 24 hour  Intake      6 ml  Output      0 ml  Net      6 ml   Filed Weights   07/22/12 0630 07/23/12 2113 07/24/12 2126  Weight: 55.1 kg (121 lb 7.6 oz) 55.101 kg (121 lb 7.6 oz) 55.101 kg (121 lb 7.6 oz)    Exam:   General: Weak appearing. Lying flat in bed  HEENT: No tongue swelling. Stridor noted, but good air movement--> have elevated head of bed  Cardiovascular: RRR without MGR  Respiratory: Diminished throughout. No wheeze, mainly upper airway transmission  Abdomen: Soft, NT, ND  Ext:  No CCE  Data Reviewed: Basic Metabolic Panel:  Recent Labs Lab 07/19/12 0500 07/19/12 2330 07/21/12 0540 07/22/12 0622  NA 135 134* 137 133*  K 4.5 4.5 4.3 3.6  CL 94* 94* 96 93*  CO2 29 27 27 23   GLUCOSE 128* 119* 98 115*  BUN 61* 79* 43* 72*  CREATININE 2.93* 3.97* 3.13* 4.64*  CALCIUM 8.3* 8.3* 8.4 8.1*  PHOS 3.9 5.7* 5.3* 4.3   Liver Function Tests:  Recent Labs Lab 07/19/12 0500 07/19/12 2330 07/21/12 0540 07/22/12 0622  ALBUMIN 2.6* 2.5* 2.7* 2.7*   No results found for this basename: LIPASE, AMYLASE,  in the last 168 hours No results found for this basename: AMMONIA,  in the last 168 hours CBC:  Recent Labs Lab 07/19/12 0500 07/19/12 2330 07/21/12 0540 07/22/12 0622  WBC 19.1* 16.9*  13.9* 19.7*  HGB 9.3* 9.2* 10.2* 11.1*  HCT 29.0* 27.8* 31.7* 32.9*  MCV 98.6 96.9 99.1 96.5  PLT 384 380 415* 448*   Cardiac Enzymes: No results found for this basename: CKTOTAL, CKMB, CKMBINDEX, TROPONINI,  in the last 168 hours BNP (last 3 results)  Recent Labs  07/04/12 0724  PROBNP 45870.0*   CBG:  Recent Labs Lab 07/20/12 1945 07/20/12 2353 07/21/12 0356 07/21/12 0824 07/22/12 0908  GLUCAP 90 88 93 100* 127*    Recent Results (from the past 240 hour(s))  CLOSTRIDIUM DIFFICILE BY PCR     Status: None   Collection Time    07/21/12  3:03 PM      Result Value Range Status   C difficile by pcr NEGATIVE  NEGATIVE Final     Studies: No results found.  Scheduled Meds: . amLODipine  10 mg Oral QHS  . aspirin EC  325 mg Oral Daily  . darbepoetin (ARANESP) injection - DIALYSIS  100 mcg Intravenous Q Tue-HD  . doxercalciferol  2 mcg Intravenous Q T,Th,Sa-HD  . feeding supplement  30 mL Oral BID  . metoprolol tartrate  25 mg Oral BID  . multivitamin  1 tablet Oral Daily  . pantoprazole  40 mg Oral BID  . sodium chloride  3 mL Intravenous Q12H   PAF:  Rhythm is stable IN NSR rates 60's this am  Continue amiodarone for 48 hours and then d/c.  Would  Agree that she is a palliative care hospice candidate.  No aggressive cardiac w/u planned  Charlton Haws

## 2012-07-28 ENCOUNTER — Inpatient Hospital Stay (HOSPITAL_COMMUNITY): Payer: Medicare Other

## 2012-07-28 LAB — RENAL FUNCTION PANEL
CO2: 22 mEq/L (ref 19–32)
Calcium: 7.9 mg/dL — ABNORMAL LOW (ref 8.4–10.5)
GFR calc Af Amer: 7 mL/min — ABNORMAL LOW (ref >90)
GFR calc non Af Amer: 6 mL/min — ABNORMAL LOW (ref >90)
Glucose, Bld: 116 mg/dL — ABNORMAL HIGH (ref 70–99)
Phosphorus: 6 mg/dL — ABNORMAL HIGH (ref 2.3–4.6)
Potassium: 4.6 mEq/L (ref 3.5–5.1)
Sodium: 133 mEq/L — ABNORMAL LOW (ref 135–145)

## 2012-07-28 LAB — CBC
Hemoglobin: 10.8 g/dL — ABNORMAL LOW (ref 12.0–15.0)
MCH: 32.1 pg (ref 26.0–34.0)
MCHC: 32.8 g/dL (ref 30.0–36.0)
Platelets: 317 10*3/uL (ref 150–400)
RBC: 3.36 MIL/uL — ABNORMAL LOW (ref 3.87–5.11)

## 2012-07-28 MED ORDER — CALCIUM ACETATE 667 MG PO CAPS
667.0000 mg | ORAL_CAPSULE | Freq: Three times a day (TID) | ORAL | Status: DC
  Administered 2012-07-29 – 2012-08-02 (×12): 667 mg via ORAL
  Filled 2012-07-28 (×17): qty 1

## 2012-07-28 MED ORDER — SODIUM CHLORIDE 0.9 % IV SOLN: 100.0000 mL | INTRAVENOUS | Status: DC | PRN

## 2012-07-28 MED ORDER — LIDOCAINE-PRILOCAINE 2.5-2.5 % EX CREA: 1.0000 "application " | TOPICAL_CREAM | CUTANEOUS | Status: DC | PRN

## 2012-07-28 MED ORDER — HEPARIN SODIUM (PORCINE) 1000 UNIT/ML DIALYSIS
2000.0000 [IU] | Freq: Once | INTRAMUSCULAR | Status: AC
  Administered 2012-07-28: 2000 [IU] via INTRAVENOUS_CENTRAL

## 2012-07-28 MED ORDER — ASPIRIN EC 325 MG PO TBEC
325.0000 mg | DELAYED_RELEASE_TABLET | Freq: Every day | ORAL | Status: DC
  Administered 2012-07-29: 325 mg via ORAL
  Filled 2012-07-28 (×2): qty 1

## 2012-07-28 MED ORDER — HEPARIN SODIUM (PORCINE) 1000 UNIT/ML DIALYSIS: 1000.0000 [IU] | INTRAMUSCULAR | Status: DC | PRN

## 2012-07-28 MED ORDER — ALTEPLASE 2 MG IJ SOLR: 2.0000 mg | Freq: Once | INTRAMUSCULAR | Status: DC | PRN

## 2012-07-28 MED ORDER — NEPRO/CARBSTEADY PO LIQD: 237.0000 mL | ORAL | Status: DC | PRN

## 2012-07-28 MED ORDER — PENTAFLUOROPROP-TETRAFLUOROETH EX AERO: 1.0000 "application " | INHALATION_SPRAY | CUTANEOUS | Status: DC | PRN

## 2012-07-28 MED ORDER — LIDOCAINE HCL (PF) 1 % IJ SOLN: 5.0000 mL | INTRAMUSCULAR | Status: DC | PRN

## 2012-07-28 NOTE — Progress Notes (Signed)
Subjective:  On hd "just do not feel good in general"  Tolerating hd so far with no drop in bp Objective Vital signs in last 24 hours: Filed Vitals:   07/28/12 1301 07/28/12 1310 07/28/12 1330 07/28/12 1400  BP: 195/79 193/78 181/74 171/54  Pulse: 75 77 72 67  Temp: 98.7 F (37.1 C)     TempSrc: Oral     Resp: 19 18 22 20   Height:      Weight: 57.5 kg (126 lb 12.2 oz)     SpO2: 100%      Weight change: -0.4 kg (-14.1 oz)  Intake/Output Summary (Last 24 hours) at 07/28/12 1420 Last data filed at 07/28/12 0940  Gross per 24 hour  Intake  637.1 ml  Output    400 ml  Net  237.1 ml   Labs: Basic Metabolic Panel:  Recent Labs Lab 07/22/12 0622 07/26/12 0700 07/28/12 1307  NA 133* 129* 133*  K 3.6 5.1 4.6  CL 93* 89* 94*  CO2 23 20 22   GLUCOSE 115* 105* 116*  BUN 72* 116* 61*  CREATININE 4.64* 6.92* 5.65*  CALCIUM 8.1* 7.8* 7.9*  PHOS 4.3 6.9* 6.0*   Liver Function Tests:  Recent Labs Lab 07/22/12 0622 07/26/12 0700 07/28/12 1307  ALBUMIN 2.7* 2.8* 2.4*   No results found for this basename: LIPASE, AMYLASE,  in the last 168 hours No results found for this basename: AMMONIA,  in the last 168 hours CBC:  Recent Labs Lab 07/22/12 0622 07/26/12 0700 07/28/12 1307  WBC 19.7* 16.4* 16.3*  HGB 11.1* 11.1* 10.8*  HCT 32.9* 32.9* 32.9*  MCV 96.5 96.5 97.9  PLT 448* 350 317   Cardiac Enzymes:  Recent Labs Lab 07/26/12 0801  TROPONINI <0.30   CBG:  Recent Labs Lab 07/22/12 0908  GLUCAP 127*    Iron Studies: No results found for this basename: IRON, TIBC, TRANSFERRIN, FERRITIN,  in the last 72 hours Studies/Results: No results found. Medications: . sodium chloride 10 mL/hr (07/26/12 1635)  . amiodarone (NEXTERONE PREMIX) 360 mg/200 mL dextrose 30 mg/hr (07/28/12 0154)   . amLODipine  10 mg Oral Daily  . doxercalciferol  2 mcg Intravenous Q T,Th,Sa-HD  . feeding supplement  1 Container Oral TID BM  . feeding supplement  30 mL Oral BID  .  metoprolol tartrate  25 mg Oral BID  . multivitamin  1 tablet Oral QHS  . pantoprazole  40 mg Oral BID  . sodium chloride  3 mL Intravenous Q12H     Physical Exam: General: Alert,Thin chronically  Ill WF Heart: RRR now Lungs: decreased bs at bases and some upper airway stridor Abdomen: BS pos.  Soft, nontender Extremities: Dialysis Access: no pedal edema/ R U A AVF patent on HD   [facility]: [facility] 3.75hrs EDW 59kg Bath 2K/2.5CA F160 Heparin 2000 RUA AVF Hect 2ug EPO 2200   Assessment/Plan:  1. Afib w RVR- in sinus rhythm now, per cards and cards signing off 2. AMS/ status epilepticus- felt due to cefipime toxicity; off Keppra, off dilantin; previously intubated  3. ESRD - nl tts ([facility])  off schedule, HD today and then back on schedule TTS  For now 4. Anemia - Hgb 10.8 < 11.1<11.1 and holding ESA - ^ ferritin - no Fe;  5. MBD/CKD- ^ P slightly then down to 4.3 without binders - and now back up 6.9> 6.0  / CA corrected 9.0  start Phoslo 1 ac /on hectorol 2; 6. HTN/volume -  5kg under prior dry weight, bid 25 mg metoprolol and norvasc 10mg , BP's higher  Today / with 3 l uf today and attempt 2.5 liter uf in am 7. Nutrition - IR unable to place NG tube for supplemental feedings- pt declined after 2 attempts; family wishes no TF; was on D2 diet previously, will resume. 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 PNA- original admit diagnosis 9. DNR- pt now full DNR/ and deciding further HD after next few txs./ however  today  as noted per Dr. Malachi Bonds    "family now wanting everything done"  And thus will try recliner HD attempt  Lenny Pastel, PA-C [facility] Beeper [phone] 07/28/2012,2:20 PM  LOS: 24 days   Patient seen and examined.  Agree with assessment and plan as above.  Vinson Moselle  MD [phone] pgr    [phone] cell 07/28/2012, 6:30 PM

## 2012-07-28 NOTE — Progress Notes (Signed)
Pt went to MRI via bed, Pt on amiodarone drip, charge nurse Aleasha accompanied pt to MRI.

## 2012-07-28 NOTE — Progress Notes (Signed)
Physical Therapy Treatment Patient Details Name: Christine Graham MRN: [id number] DOB: 1925-07-21 Today's Date: 07/28/2012 Time: 4098-1191 PT Time Calculation (min): 24 min  PT Assessment / Plan / Recommendation Comments on Treatment Session  Pt with minimal activity tolerance and is extremely weak. Pt requires encouragement to perform OOB mobility and was provided education on why OOB mobility is important in getting better. Pt with verbal confirmation of understanding.    Follow Up Recommendations  SNF     Does the patient have the potential to tolerate intense rehabilitation     Barriers to Discharge        Equipment Recommendations  None recommended by PT    Recommendations for Other Services    Frequency Min 3X/week   Plan Discharge plan remains appropriate;Frequency remains appropriate    Precautions / Restrictions Precautions Precautions: Fall Precaution Comments: dysphasia diet with nectar thick liquids. Restrictions Weight Bearing Restrictions: No   Pertinent Vitals/Pain Pt c/o R ear pain - RN notified and to provide pain meds.    Mobility  Bed Mobility Bed Mobility: Supine to Sit;Sitting - Scoot to Edge of Bed Supine to Sit: 1: +2 Total assist;With rails;HOB elevated Supine to Sit: Patient Percentage: 20% Sitting - Scoot to Edge of Bed: 1: +2 Total assist Sitting - Scoot to Edge of Bed: Patient Percentage: 0% Details for Bed Mobility Assistance: max directional verbal and tactile cues to complete task, pt unable to use bilat UEs to sustain a functional grasp or pull to assist with transfer to EOB Transfers Transfers: Sit to Stand;Stand to Sit;Stand Pivot Transfers Sit to Stand: 1: +2 Total assist;With upper extremity assist;From bed Sit to Stand: Patient Percentage: 10% Stand to Sit: 1: +2 Total assist;With upper extremity assist;To chair/3-in-1 Stand to Sit: Patient Percentage: 10% Stand Pivot Transfers: 1: +2 Total assist Stand Pivot Transfers: Patient  Percentage: 0% Details for Transfer Assistance: pt unable to maintain upright posture or grip on PTs arms to assist with transfer. blocked knees due to inability of patient to maintain adequate sustained knee extension Ambulation/Gait Ambulation/Gait Assistance: Not tested (comment)    Exercises     PT Diagnosis:    PT Problem List:   PT Treatment Interventions:     PT Goals Acute Rehab PT Goals PT Goal: Supine/Side to Sit - Progress: Progressing toward goal PT Goal: Sit to Supine/Side - Progress: Progressing toward goal PT Transfer Goal: Bed to Chair/Chair to Bed - Progress: Progressing toward goal PT Goal: Stand - Progress: Progressing toward goal  Visit Information  Last PT Received On: 07/28/12 Assistance Needed: +2    Subjective Data  Subjective: Pt received supine in bed agreeable to PT s/p max encouragement   Cognition  Cognition Arousal/Alertness: Awake/alert Behavior During Therapy: Flat affect Overall Cognitive Status: Impaired/Different from baseline Area of Impairment: Memory;Attention Current Attention Level: Focused Memory: Decreased short-term memory Following Commands: Follows one step commands with increased time Safety/Judgement: Decreased awareness of safety;Decreased awareness of deficits General Comments: pt appears to have regressed comprehension in addition to slow processing and initiation    Balance  Balance Balance Assessed: Yes Static Sitting Balance Static Sitting - Balance Support: Bilateral upper extremity supported;Feet supported Static Sitting - Level of Assistance: 2: Max assist Static Sitting - Comment/# of Minutes: Pt sat EOB approx 10 min with max verbal cues to attempt to achieve cervical and back extension. with onset of fatigue pt with strong posterior lean as well. pt unable to maintain EOB balance indep. despite max encouragement and cues  End of Session PT - End of Session Equipment Utilized During Treatment: Gait belt Activity  Tolerance: Patient limited by fatigue Patient left: in chair;with call bell/phone within reach Nurse Communication: Mobility status (edu RN and aide on how transfer doing std pvt with bed pad)   GP     Theodor Mustin, Becky Sax 07/28/2012, 10:21 AM  Lewis Shock, PT, DPT Pager #: [phone] Office #: [phone]

## 2012-07-28 NOTE — Progress Notes (Signed)
Occupational Therapy Treatment Patient Details Name: Christine Graham MRN: [id number] DOB: [birth year] Today's Date: 07/28/2012 Time: 4098-1191 OT Time Calculation (min): 28 min  OT Assessment / Plan / Recommendation Comments on Treatment Session Pt very limited by fatigue.  Pt against getting up in the chair but did ultimately agree although did not assist much with the process.  Appears pt needs more and more encouragment to participate.    Follow Up Recommendations  SNF    Barriers to Discharge       Equipment Recommendations  Other (comment)    Recommendations for Other Services    Frequency Min 2X/week   Plan Discharge plan remains appropriate    Precautions / Restrictions Precautions Precautions: Fall Precaution Comments: dysphasia diet with nectar thick liquids. Restrictions Weight Bearing Restrictions: No   Pertinent Vitals/Pain Pt c/o ear pain.  Nursing notified.  Pt with significant wheezing while sitting up in chair.    ADL  Eating/Feeding: Performed;Minimal assistance Where Assessed - Eating/Feeding: Bed level Grooming: Performed;Teeth care;Wash/dry hands;Moderate assistance Where Assessed - Grooming: Supported sitting Toilet Transfer: Teacher, early years/pre: Patient Percentage: 10% Statistician Method: Surveyor, minerals: Materials engineer and Hygiene: Simulated;+2 Total assistance Toileting - Architect and Hygiene: Patient Percentage: 0% Where Assessed - Glass blower/designer Manipulation and Hygiene: Sit to stand from 3-in-1 or toilet Equipment Used: Gait belt Transfers/Ambulation Related to ADLs: Pt unable to hold self up on side of bed.  Required +2 assist for all mobility. ADL Comments: Pt requires max to total assist for all adls.  Mod assist for grooming in chair.  Pt requires a great amount of encouragment today just to attempt to participate in simple grooming tasks  while sitting in the chair.  Hand over hand techniques used for initiation.  Pt with some follow thru but then would stop.    OT Diagnosis:    OT Problem List:   OT Treatment Interventions:     OT Goals Acute Rehab OT Goals OT Goal Formulation: With patient Time For Goal Achievement: 08/04/12 Potential to Achieve Goals: Fair ADL Goals Pt Will Perform Grooming: with set-up;with supervision;Sitting, chair;Supported ADL Goal: Grooming - Progress: Not progressing Pt Will Perform Upper Body Bathing: with set-up;with supervision;Supported;Sitting, chair ADL Goal: Upper Body Bathing - Progress: Not progressing Pt Will Perform Lower Body Bathing: with mod assist;Supported;Sit to stand from chair ADL Goal: Lower Body Bathing - Progress: Not progressing Pt Will Transfer to Toilet: with mod assist;Ambulation;3-in-1 ADL Goal: Toilet Transfer - Progress: Not progressing Arm Goals Pt Will Perform AROM: with supervision, verbal cues required/provided;1 set;10 reps;Bilateral upper extremities Arm Goal: AROM - Progress: Not progressing Miscellaneous OT Goals Miscellaneous OT Goal #1: Pt will be min A  to come up to EOB with HOB flat and no rail for BADLs. OT Goal: Miscellaneous Goal #1 - Progress: Not progressing Miscellaneous OT Goal #2: Pt will consistently follow one step commnands OT Goal: Miscellaneous Goal #2 - Progress: Progressing toward goals  Visit Information  Last OT Received On: 07/28/12 Assistance Needed: +2 PT/OT Co-Evaluation/Treatment: Yes    Subjective Data      Prior Functioning       Cognition  Cognition Arousal/Alertness: Awake/alert Behavior During Therapy: Flat affect Overall Cognitive Status: Impaired/Different from baseline Area of Impairment: Memory;Attention Current Attention Level: Focused Memory: Decreased short-term memory Following Commands: Follows one step commands inconsistently;Follows one step commands with increased time Safety/Judgement: Decreased  awareness of safety;Decreased awareness of deficits General Comments: Pt  overall with slow processing and initiation.    Mobility  Bed Mobility Bed Mobility: Supine to Sit;Sitting - Scoot to Edge of Bed Supine to Sit: 1: +2 Total assist;With rails;HOB elevated Supine to Sit: Patient Percentage: 20% Sitting - Scoot to Edge of Bed: 1: +2 Total assist Sitting - Scoot to Edge of Bed: Patient Percentage: 0% Details for Bed Mobility Assistance: cues to initiate rolling and sidelying to sit and to scoot to EOB Transfers Transfers: Sit to Stand;Stand to Sit Sit to Stand: 1: +2 Total assist;With upper extremity assist;From bed Sit to Stand: Patient Percentage: 10% Stand to Sit: 1: +2 Total assist;With upper extremity assist;To chair/3-in-1 Stand to Sit: Patient Percentage: 10% Details for Transfer Assistance: Pt overall dependent for transfers. pt did not even make it fully into standing.  did more of a squat pivot transfer.    Exercises      Balance Balance Balance Assessed: Yes Static Sitting Balance Static Sitting - Balance Support: Bilateral upper extremity supported;Feet supported Static Sitting - Level of Assistance: 2: Max assist Static Sitting - Comment/# of Minutes: Pt sat EOB about 8 minutes.  Pt with significant posterior lean.  Pt was able to hold balance intermintently for 1 sec or so with mod assist.   End of Session OT - End of Session Equipment Utilized During Treatment: Gait belt Activity Tolerance: Patient limited by fatigue Patient left: in chair;with call bell/phone within reach;with family/visitor present Nurse Communication: Mobility status  GO     Hope Budds 07/28/2012, 10:09 AM [phone]

## 2012-07-28 NOTE — Progress Notes (Signed)
Patient ID: [name], female   DOB: Nov 15, 1925, 79 y.o.   MRN: [id number] PROGRESS NOTE  Christine Graham WUJ:[id number] DOB: 1925/10/24 DOA: 07/04/2012 PCP: Judie Petit, MD  Subjective:  Lethargic   Objective: Filed Vitals:   07/24/12 1937 07/24/12 2126 07/25/12 0550 07/25/12 0919  BP: 184/61 185/67 147/66 167/58  Pulse:  75 66 72  Temp: 98.3 F (36.8 C) 98.7 F (37.1 C) 97.8 F (36.6 C) 98.1 F (36.7 C)  TempSrc: Oral Oral Oral   Resp: 18 18 18 17   Height:      Weight:  55.101 kg (121 lb 7.6 oz)    SpO2: 100% 97% 96% 100%    Intake/Output Summary (Last 24 hours) at 07/25/12 1017 Last data filed at 07/25/12 0944  Gross per 24 hour  Intake      6 ml  Output      0 ml  Net      6 ml   Filed Weights   07/22/12 0630 07/23/12 2113 07/24/12 2126  Weight: 55.1 kg (121 lb 7.6 oz) 55.101 kg (121 lb 7.6 oz) 55.101 kg (121 lb 7.6 oz)    Exam:   General: Weak appearing. Lying flat in bed  HEENT: No tongue swelling. Stridor noted, but good air movement--> have elevated head of bed  Cardiovascular: RRR without MGR  Respiratory: Diminished throughout. No wheeze, mainly upper airway transmission  Abdomen: Soft, NT, ND  Ext:  No CCE  Data Reviewed: Basic Metabolic Panel:  Recent Labs Lab 07/19/12 0500 07/19/12 2330 07/21/12 0540 07/22/12 0622  NA 135 134* 137 133*  K 4.5 4.5 4.3 3.6  CL 94* 94* 96 93*  CO2 29 27 27 23   GLUCOSE 128* 119* 98 115*  BUN 61* 79* 43* 72*  CREATININE 2.93* 3.97* 3.13* 4.64*  CALCIUM 8.3* 8.3* 8.4 8.1*  PHOS 3.9 5.7* 5.3* 4.3   Liver Function Tests:  Recent Labs Lab 07/19/12 0500 07/19/12 2330 07/21/12 0540 07/22/12 0622  ALBUMIN 2.6* 2.5* 2.7* 2.7*   No results found for this basename: LIPASE, AMYLASE,  in the last 168 hours No results found for this basename: AMMONIA,  in the last 168 hours CBC:  Recent Labs Lab 07/19/12 0500 07/19/12 2330 07/21/12 0540 07/22/12 0622  WBC 19.1* 16.9* 13.9* 19.7*  HGB 9.3*  9.2* 10.2* 11.1*  HCT 29.0* 27.8* 31.7* 32.9*  MCV 98.6 96.9 99.1 96.5  PLT 384 380 415* 448*   Cardiac Enzymes: No results found for this basename: CKTOTAL, CKMB, CKMBINDEX, TROPONINI,  in the last 168 hours BNP (last 3 results)  Recent Labs  07/04/12 0724  PROBNP 45870.0*   CBG:  Recent Labs Lab 07/20/12 1945 07/20/12 2353 07/21/12 0356 07/21/12 0824 07/22/12 0908  GLUCAP 90 88 93 100* 127*    Recent Results (from the past 240 hour(s))  CLOSTRIDIUM DIFFICILE BY PCR     Status: None   Collection Time    07/21/12  3:03 PM      Result Value Range Status   C difficile by pcr NEGATIVE  NEGATIVE Final     Studies: No results found.  Scheduled Meds: . amLODipine  10 mg Oral QHS  . aspirin EC  325 mg Oral Daily  . darbepoetin (ARANESP) injection - DIALYSIS  100 mcg Intravenous Q Tue-HD  . doxercalciferol  2 mcg Intravenous Q T,Th,Sa-HD  . feeding supplement  30 mL Oral BID  . metoprolol tartrate  25 mg Oral BID  . multivitamin  1 tablet Oral  Daily  . pantoprazole  40 mg Oral BID  . sodium chloride  3 mL Intravenous Q12H   PAF:  Rhythm is stable IN NSR rates 60's this am  D/C amiodarone after today .  Would  Agree that she is a palliative care hospice candidate.  No aggressive cardiac w/u planned Will sign off  Charlton Haws

## 2012-07-28 NOTE — Progress Notes (Signed)
[facility] PROGRESS NOTE  Christine Graham ZOX:[id number] DOB: 1925/03/07 DOA: 07/04/2012 PCP: Judie Petit, MD  SUMMARY:  Patient is an 79 year old female with history of end-stage renal disease, hypertension, hyperlipidemia.  She presented on May 27 with an apparent viral syndrome with leukocytosis and pulmonary edema on her chest x-ray. There was concern she had healthcare associated pneumonia and was started on vancomycin and cefepime.  After initiation of antibiotics she had progressive altered mental status that led to obtundation. She had an EEG which demonstrated epileptiform activity and a negative brain MRI. She was loaded with Ativan and Dilantin but was concerned she had nonconvulsive seizures so she was intubated and placed on a propofol drip.  She was extubated on June 7 diet immediately reintubated due to stridor. She was started on Decadron.  She was extubated again on June 11. She continued to have stridor after extubation and was evaluated by ENT.  Laryngoscopy on June 16 demonstrated intubation trauma with ulceration of the right arytenoid and subglottic posterior commissure. She is at risk of progression and subglottic stenosis and will need reevaluation by ENT early next week.  Her antiepileptic medications were discontinued and this is thought to be an acute event due to her cefepime.  She has remained seizure-free.  She has had progressive weakness which makes it difficult to even lift her arms or her legs off the bed. I am afraid that she would not be able to sit in a chair for dialysis and she is not a candidate for LTAC.  It is very important that we continue to work on goals of care, and hopefully the family will be able to come to consensus sometime next week.  Previously the family and expressed interest in comfort measures, including a blood draws or painful procedures.  They declined to meet with palliative care.  Today they have expressed to me that they would like  everything done, including anticoagulation and painful procedures if necessary.  I would like them to try to work with palliative care next week to clearly outline their goals of care and perhaps we will need to have a family meeting with the subspecialists to make sure we are all on the same page.    Assessment/Plan  New onset atrial fibrillation with RVR and relative hypotension.  The patient reverted to normal sinus rhythm after initiation of amiodarone infusion. Troponin was negative and TSH was within normal limits.  CHAD2vasc score is 4.   -  Family is now interested in anticoagulation and aggressive care -  For now I will start her on aspirin until a more thorough conversation with the patient and her family about the risks and benefits of systemic anticoagulation can be had -  Will start dose of 325 instead of 81 mg because of the possibility of a TIA on June 18th  Possible TIA on June 18. At the time the patient was quite ill with atrial fibrillation with RVR and hypertension. Workup was deferred at that time because the family wanted to pursue more comfort measures. The family would now like to pursue more aggressive measures so will complete MRI. -  MRI brain -  MRI brain is positive for stroke will to complete stroke workup.  Severe weakness and deconditioning likely due to prolonged illness and prolonged propofol infusion.  The patient is probably too weak to sit in a chair for dialysis which will be necessary since she is not a candidate for LTAC.   -  Continue  physical therapy and occupational therapy -  Continue it tends to get patient out of bed to chair -  Will attempt to have patient sits in chair for hemodialysis tomorrow  Postextubation stridor with eschar over her right arytenoid with ulceration extending into the subglottic posterior commissure and small ulceration of the left arytenoid. Vocal cords were mobile and airway looked good. No tracheal stenosis but at risk for  tracheal stenosis in the future.  -  Continue protonix -  Keep head of bed elevated at all time -  High risk of aspiration, speech therapy discussed this with the family -  Repeat evaluation by ENT next week  Diarrhea, resolved, C. difficile negative.    HYPERTENSION, Blood pressures elevated.  -  Continue Norvasc  Acute diastolic CHF (congestive heart failure), compensated   ESRD on dialysis, per nephrology.   -  Will need to regain strength to sit in chair for dialysis.    Resolved non-convulsive status epilepticus due to cefepime, currently seizure free HYPERLIPIDEMIA, stable chronic GOUT, asymptomatic Muscular deconditioning, severe. Either to hospice or to skilled nursing. Left ear pain.  No evidence of acute otitis media.  Possible a serous effusion. -  Auralgan Dysphagia due to vocal cord injury and severe weakness -  Patient is refusing thickened liquids however the family would like to be aggressive with care -  Patient is at risk of dehydration -  Will need to keep a close eye on her volume status -  Reassess her swallow next week -  Very high risk of aspiration  Diet:  Dysphagia 2 with nectar thin liquids Access:  Peripheral IV IVF:  Off Proph:  SCDs  Code Status: DO NOT RESUSCITATE Family Communication: Spoke with patient and her son, HPOA Disposition Plan:   We'll try to get patient to sit in a chair and to attempt dialysis in the chair tomorrow.  We need to determine if she would be a candidate for outpatient dialysis. If not, we need to have another conversation about goals of care and possible hospice.   Consultants:  Nephrology  CCM signed off  neurohospitalist signed off  ENT [facility] Procedures:  ETT 5/29 >> 6/07  R [place] CVC 5/29 >> 6/12  Rt graft>>>  ETT 6/07 >> 6/11  Laryngoscopy 6/16  Antibiotics:  Vanco 5/27 >> 5/30  Cefepime 5/27 >> 5/30  Zosyn 5/28 x1 dose  Acyclovir 5/29 >> 5/31  HPI/Subjective:  The patient states that she  feels worse today.  She does not like the thickened liquids diet and would like thin liquids.  She refuses thickened liquids despite feeling very thirsty.  Has nausea and little appetite.  Denies constipation and diarrhea.  Strength is somewhat better.    Objective: Filed Vitals:   07/28/12 1330 07/28/12 1400 07/28/12 1430 07/28/12 1500  BP: 181/74 171/54 158/64 152/73  Pulse: 72 67 78 77  Temp:      TempSrc:      Resp: 22 20 20 18   Height:      Weight:      SpO2:        Intake/Output Summary (Last 24 hours) at 07/28/12 1535 Last data filed at 07/28/12 0940  Gross per 24 hour  Intake  637.1 ml  Output    400 ml  Net  237.1 ml   Filed Weights   07/27/12 0703 07/28/12 0524 07/28/12 1301  Weight: 54.5 kg (120 lb 2.4 oz) 55.883 kg (123 lb 3.2 oz) 57.5 kg (126 lb 12.2  oz)    Exam:   General:  Caucasian female, No acute distress  HEENT:  NCAT, MMM.    Cardiovascular:  RRR, nl S1, S2 no mrg, 2+ pulses, warm extremities  Respiratory:  No stridor, no rales heard anteriorly, no rhonchi, no wheeze, no increased WOB  Abdomen:  NABS, soft, NT/ND  MSK:  Normal tone and bulk, no LEE  Neuro:  Diffusely weak but able to lift her arms and lower legs off the bed today and her grip is now 4 minus out of 5 bilaterally, able to move her fingers and toes, no obvious facial droop  Data Reviewed: Basic Metabolic Panel:  Recent Labs Lab 07/22/12 0622 07/26/12 0700 07/28/12 1307  NA 133* 129* 133*  K 3.6 5.1 4.6  CL 93* 89* 94*  CO2 23 20 22   GLUCOSE 115* 105* 116*  BUN 72* 116* 61*  CREATININE 4.64* 6.92* 5.65*  CALCIUM 8.1* 7.8* 7.9*  MG  --  2.0  --   PHOS 4.3 6.9* 6.0*   Liver Function Tests:  Recent Labs Lab 07/22/12 0622 07/26/12 0700 07/28/12 1307  ALBUMIN 2.7* 2.8* 2.4*   No results found for this basename: LIPASE, AMYLASE,  in the last 168 hours No results found for this basename: AMMONIA,  in the last 168 hours CBC:  Recent Labs Lab 07/22/12 0622  07/26/12 0700 07/28/12 1307  WBC 19.7* 16.4* 16.3*  HGB 11.1* 11.1* 10.8*  HCT 32.9* 32.9* 32.9*  MCV 96.5 96.5 97.9  PLT 448* 350 317   Cardiac Enzymes:  Recent Labs Lab 07/26/12 0801  TROPONINI <0.30   BNP (last 3 results)  Recent Labs  07/04/12 0724  PROBNP 45870.0*   CBG:  Recent Labs Lab 07/22/12 0908  GLUCAP 127*    Recent Results (from the past 240 hour(s))  CLOSTRIDIUM DIFFICILE BY PCR     Status: None   Collection Time    07/21/12  3:03 PM      Result Value Range Status   C difficile by pcr NEGATIVE  NEGATIVE Final     Studies: No results found.  Scheduled Meds: . amLODipine  10 mg Oral Daily  . calcium acetate  667 mg Oral TID WC  . doxercalciferol  2 mcg Intravenous Q T,Th,Sa-HD  . feeding supplement  1 Container Oral TID BM  . feeding supplement  30 mL Oral BID  . metoprolol tartrate  25 mg Oral BID  . multivitamin  1 tablet Oral QHS  . pantoprazole  40 mg Oral BID  . sodium chloride  3 mL Intravenous Q12H   Continuous Infusions: . sodium chloride 10 mL/hr (07/26/12 1635)  . amiodarone (NEXTERONE PREMIX) 360 mg/200 mL dextrose 30 mg/hr (07/28/12 1510)    Active Problems:   HYPERLIPIDEMIA   GOUT   HYPERTENSION   ESRD on dialysis   Acute encephalopathy   Acute diastolic CHF (congestive heart failure)   Acute respiratory failure   Status epilepticus   Muscular deconditioning   Postextubation stridor   Diarrhea   Other dysphagia   Trauma to vocal cord   Atrial fibrillation    Time spent: 30 min    Debbera Wolken, [facility]  [facility] Pager [phone]. If 7PM-7AM, please contact night-coverage at [email or web address], password [facility] 07/28/2012, 3:35 PM  LOS: 24 days

## 2012-07-29 LAB — CBC
HCT: 33.2 % — ABNORMAL LOW (ref 36.0–46.0)
Hemoglobin: 10.7 g/dL — ABNORMAL LOW (ref 12.0–15.0)
MCH: 31.9 pg (ref 26.0–34.0)
MCHC: 32.2 g/dL (ref 30.0–36.0)
MCV: 99.1 fL (ref 78.0–100.0)
Platelets: 281 K/uL (ref 150–400)
RBC: 3.35 MIL/uL — ABNORMAL LOW (ref 3.87–5.11)
RDW: 18.4 % — ABNORMAL HIGH (ref 11.5–15.5)
WBC: 8.7 K/uL (ref 4.0–10.5)

## 2012-07-29 LAB — BASIC METABOLIC PANEL
Chloride: 97 mEq/L (ref 96–112)
GFR calc Af Amer: 16 mL/min — ABNORMAL LOW (ref >90)
GFR calc non Af Amer: 13 mL/min — ABNORMAL LOW (ref >90)
Potassium: 3.3 mEq/L — ABNORMAL LOW (ref 3.5–5.1)
Sodium: 136 mEq/L (ref 135–145)

## 2012-07-29 MED ORDER — DOXERCALCIFEROL 4 MCG/2ML IV SOLN
INTRAVENOUS | Status: AC
  Filled 2012-07-29: qty 2

## 2012-07-29 MED ORDER — HEPARIN SODIUM (PORCINE) 1000 UNIT/ML IJ SOLN
2000.0000 [IU] | Freq: Once | INTRAMUSCULAR | Status: AC
  Administered 2012-07-29: 2000 [IU] via INTRAVENOUS

## 2012-07-29 MED ORDER — ASPIRIN EC 81 MG PO TBEC
81.0000 mg | DELAYED_RELEASE_TABLET | Freq: Every day | ORAL | Status: DC
  Administered 2012-07-30 – 2012-08-02 (×3): 81 mg via ORAL
  Filled 2012-07-29 (×4): qty 1

## 2012-07-29 MED ORDER — AMIODARONE HCL 200 MG PO TABS
200.0000 mg | ORAL_TABLET | Freq: Every day | ORAL | Status: DC
  Administered 2012-07-29 – 2012-08-02 (×4): 200 mg via ORAL
  Filled 2012-07-29 (×5): qty 1

## 2012-07-29 NOTE — Progress Notes (Addendum)
[facility] PROGRESS NOTE  Christine Graham ZOX:[id number] DOB: 1925-10-23 DOA: 07/04/2012 PCP: Judie Petit, MD  SUMMARY:  Patient is an 79 year old female with history of end-stage renal disease, hypertension, hyperlipidemia.  She presented on May 27 with an apparent viral syndrome with leukocytosis and pulmonary edema on her chest x-ray. There was concern she had healthcare associated pneumonia and was started on vancomycin and cefepime.  After initiation of antibiotics she had progressive altered mental status that led to obtundation. She had an EEG which demonstrated epileptiform activity and a negative brain MRI. She was loaded with Ativan and Dilantin but was concerned she had nonconvulsive seizures so she was intubated and placed on a propofol drip.  She was extubated on June 7 diet immediately reintubated due to stridor. She was started on Decadron.  She was extubated again on June 11. She continued to have stridor after extubation and was evaluated by ENT.  Laryngoscopy on June 16 demonstrated intubation trauma with ulceration of the right arytenoid and subglottic posterior commissure. She is at risk of progression and subglottic stenosis and will need reevaluation by ENT early next week.  Her antiepileptic medications were discontinued and this is thought to be an acute event due to her cefepime.  She has remained seizure-free.  She has had progressive weakness which makes it difficult to even lift her arms or her legs off the bed.  The patient was able to sit in a chair for dialysis and seems to be making some improvement.  She is intermittently alert and oriented x4, and the wishes she expresses about goals of care are different than what her son and daughter have been expressing.  Spoke with the daughter again today about having a palliative care meeting.  Assessment/Plan  Atrial fibrillation with RVR, reverteed to normal sinus rhythm with amiodarone infusion. Troponin negative, TSH  wnl.  CHAD2vasc score is 4, 4% annual risk of stroke.  Has bled score of 2, 1.88-4.1% annual risk of bleed.  -  Will start dose of 325 instead of 81 mg because of the possibility of a TIA on June 18th, although this may have been treated for atrial fibrillation in which case 81 mg daily would be adequate.   -  If patient and family want to be aggressive, could consider anticoagulation, however the risk of significant bleeding is almost equivalent to the risk of stroke reduction.  Possible TIA on June 18. At the time the patient was quite ill with atrial fibrillation with RVR and hypertension. Workup was deferred at that time because the family wanted to pursue more comfort measures. The family would now like to pursue more aggressive measures so will complete MRI. -  MRI brain negative for stroke  Severe weakness and deconditioning improving.   -  Continue physical therapy and occupational therapy -  Continue out of bed to chair -  Continue to have patient to sit in the chair for dialysis   severe protein calorie malnutrition with poor by mouth intake -  Discussed the possibility of dexamethasone, Megace, Reglan with the family. I preference would be dexamethasone to 2 better side effect profile particularly in the setting of end-stage renal disease and possible heart disease -  Nutrition consult -  Liberalize diet as much as possible -  Dietary supplement  Postextubation stridor with eschar over her right arytenoid with ulceration extending into the subglottic posterior commissure and small ulceration of the left arytenoid. Vocal cords were mobile and airway looked good. No tracheal  stenosis but at risk for tracheal stenosis in the future.  -  Continue protonix -  Keep head of bed elevated at all time -  High risk of aspiration, speech therapy discussed this with the family -  Repeat evaluation by ENT next week  Diarrhea, resolved, C. difficile negative.    HYPERTENSION, Blood pressures  previously elevated but had severe hypotension during dialysis today -  Please hold parameters for Norvasc and metoprolol  Acute diastolic CHF (congestive heart failure), compensated   ESRD on dialysis, per nephrology.   -  Continue sitting in a chair for dialysis to increase strength    Resolved non-convulsive status epilepticus due to cefepime, currently seizure free HYPERLIPIDEMIA, stable chronic GOUT, asymptomatic Muscular deconditioning, severe.  To skilled nursing. Left ear pain.  No evidence of acute otitis media.  Possible a serous effusion. -  Auralgan Dysphagia due to vocal cord injury and severe weakness, improving -  Patient and family aware of risk of aspiration within liquids, however daughter and patient both prefer a thin -  Patient is at risk of dehydration because she refuses thickened liquid -  Reassess her swallow next week  Diet:  Dysphagia 2 with thin liquids Access:  Peripheral IV IVF:  Off Proph:  SCDs  Code Status: DO NOT RESUSCITATE Family Communication: Spoke with patient and her daughter Disposition Plan:   Plan for skilled nursing next week. Needs to be reevaluated by ENT and speech therapy to solidify a diet texture.    Consultants:  Nephrology  CCM signed off  neurohospitalist signed off  ENT [facility] Procedures:  ETT 5/29 >> 6/07  R [place] CVC 5/29 >> 6/12  Rt graft>>>  ETT 6/07 >> 6/11  Laryngoscopy 6/16  Antibiotics:  Vanco 5/27 >> 5/30  Cefepime 5/27 >> 5/30  Zosyn 5/28 x1 dose  Acyclovir 5/29 >> 5/31  HPI/Subjective:  The patient states that  she feels that she is roughly treated in dialysis. She does not like going to dialysis.  He continues to have poor appetite and nausea. She denies diarrhea. She denies shortness of breath and chest pain. Her strength is improved. Her voice is stronger.  Objective: Filed Vitals:   07/29/12 1221 07/29/12 1225 07/29/12 1229 07/29/12 1329  BP: 57/39 61/49 117/37 111/64  Pulse:   63    Temp:      TempSrc:      Resp:   19   Height:      Weight:      SpO2:   100%     Intake/Output Summary (Last 24 hours) at 07/29/12 1428 Last data filed at 07/29/12 1230  Gross per 24 hour  Intake  350.7 ml  Output   4890 ml  Net -4539.3 ml   Filed Weights   07/28/12 1301 07/28/12 1750 07/29/12 0540  Weight: 57.5 kg (126 lb 12.2 oz) 55 kg (121 lb 4.1 oz) 54.2 kg (119 lb 7.8 oz)    Exam:   General:  Caucasian female, No acute distress  HEENT:  NCAT, MMM.    Cardiovascular:  RRR, nl S1, S2 no mrg, 2+ pulses, warm extremities  Respiratory:  No stridor, CTAB, no increased WOB  Abdomen:  NABS, soft, NT/ND  MSK:  Normal tone and bulk, no LEE  Neuro:  Able to lift her arms and lower legs off the bed today and her grip is still 4 minus out of 5 bilaterally, able to move her fingers and toes, no obvious facial droop.  Her voice  is stronger today  Data Reviewed: Basic Metabolic Panel:  Recent Labs Lab 07/26/12 0700 07/28/12 1307 07/29/12 0855  NA 129* 133* 136  K 5.1 4.6 3.3*  CL 89* 94* 97  CO2 20 22 27   GLUCOSE 105* 116* 117*  BUN 116* 61* 20  CREATININE 6.92* 5.65* 2.97*  CALCIUM 7.8* 7.9* 8.2*  MG 2.0  --   --   PHOS 6.9* 6.0*  --    Liver Function Tests:  Recent Labs Lab 07/26/12 0700 07/28/12 1307  ALBUMIN 2.8* 2.4*   No results found for this basename: LIPASE, AMYLASE,  in the last 168 hours No results found for this basename: AMMONIA,  in the last 168 hours CBC:  Recent Labs Lab 07/26/12 0700 07/28/12 1307 07/29/12 0855  WBC 16.4* 16.3* 8.7  HGB 11.1* 10.8* 10.7*  HCT 32.9* 32.9* 33.2*  MCV 96.5 97.9 99.1  PLT 350 317 281   Cardiac Enzymes:  Recent Labs Lab 07/26/12 0801  TROPONINI <0.30   BNP (last 3 results)  Recent Labs  07/04/12 0724  PROBNP 45870.0*   CBG: No results found for this basename: GLUCAP,  in the last 168 hours  Recent Results (from the past 240 hour(s))  CLOSTRIDIUM DIFFICILE BY PCR     Status: None    Collection Time    07/21/12  3:03 PM      Result Value Range Status   C difficile by pcr NEGATIVE  NEGATIVE Final     Studies: Mr Brain Wo Contrast  07/29/2012   *RADIOLOGY REPORT*  Clinical Data: Recent episode of facial droop and somewhat asymmetric weakness.  MRI HEAD WITHOUT CONTRAST  Technique:  Multiplanar, multiecho pulse sequences of the brain and surrounding structures were obtained according to standard protocol without intravenous contrast.  Comparison: Multiple priors.  Findings: The patient had difficulty remaining motionless for the study.  Images are suboptimal.  Small or subtle lesions could be overlooked.  No acute stroke or acute hemorrhage.  1 cm sized right lateral pontine cistern mass lesion with mild restricted diffusion likely meningioma.  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  Advanced atrophy and chronic microvascular ischemic change.  Small focus chronic hemorrhage left globus pallidus.  No midline shift.  No obvious vascular occlusion.  No obvious osseous lesions.  IMPRESSION: Markedly degraded scan due to motion.  Suspect 1 cm meningioma right lateral pontine cistern mass.  No acute cerebral infarction is identified.  Advanced atrophy and chronic microvascular ischemic change.   Original Report Authenticated By: Davonna Belling, M.D.    Scheduled Meds: . amLODipine  10 mg Oral Daily  . aspirin EC  325 mg Oral Daily  . calcium acetate  667 mg Oral TID WC  . doxercalciferol      . doxercalciferol  2 mcg Intravenous Q T,Th,Sa-HD  . feeding supplement  1 Container Oral TID BM  . feeding supplement  30 mL Oral BID  . metoprolol tartrate  25 mg Oral BID  . multivitamin  1 tablet Oral QHS  . pantoprazole  40 mg Oral BID  . sodium chloride  3 mL Intravenous Q12H   Continuous Infusions: . sodium chloride 10 mL/hr (07/26/12 1635)  . amiodarone (NEXTERONE PREMIX) 360 mg/200 mL dextrose 30 mg/hr (07/29/12 1235)    Active Problems:   HYPERLIPIDEMIA    GOUT   HYPERTENSION   ESRD on dialysis   Acute encephalopathy   Acute diastolic CHF (congestive heart failure)   Acute respiratory failure   Status  epilepticus   Muscular deconditioning   Postextubation stridor   Diarrhea   Other dysphagia   Trauma to vocal cord   Atrial fibrillation    Time spent: 30 min    Aleksander Edmiston, [facility]  [facility] Pager [phone]. If 7PM-7AM, please contact night-coverage at [email or web address], password [facility] 07/29/2012, 2:28 PM  LOS: 25 days

## 2012-07-29 NOTE — Progress Notes (Signed)
Pt is on dysphagia 2 diet with thickened liquids. Daughter at bedside giving mother thin liquids. I explained to daughter about diet and reason for it but daughter said to me"she's doing fine." Monitoring for signs of aspiration

## 2012-07-29 NOTE — Progress Notes (Signed)
Pt's back in sinus rhythm with a HR in the 60's. Christine Graham notified.

## 2012-07-29 NOTE — Progress Notes (Signed)
Subjective:  Husband feeding her breakfast, says she was up in chair for 3 hours yesterday  Objective Vital signs in last 24 hours: Filed Vitals:   07/29/12 0540 07/29/12 0835 07/29/12 0846 07/29/12 0900  BP: 182/44 174/88 176/43 185/50  Pulse: 67 66 77 51  Temp: 97 F (36.1 C) 98.5 F (36.9 C)    TempSrc: Oral Oral    Resp: 18 20 21 18   Height:      Weight: 54.2 kg (119 lb 7.8 oz)     SpO2: 97% 100%     Weight change: 3 kg (6 lb 9.8 oz)  Intake/Output Summary (Last 24 hours) at 07/29/12 0930 Last data filed at 07/29/12 0400  Gross per 24 hour  Intake  590.7 ml  Output   2003 ml  Net -1412.3 ml   Labs: Basic Metabolic Panel:  Recent Labs Lab 07/26/12 0700 07/28/12 1307  NA 129* 133*  K 5.1 4.6  CL 89* 94*  CO2 20 22  GLUCOSE 105* 116*  BUN 116* 61*  CREATININE 6.92* 5.65*  CALCIUM 7.8* 7.9*  PHOS 6.9* 6.0*   Liver Function Tests:  Recent Labs Lab 07/26/12 0700 07/28/12 1307  ALBUMIN 2.8* 2.4*   No results found for this basename: LIPASE, AMYLASE,  in the last 168 hours No results found for this basename: AMMONIA,  in the last 168 hours CBC:  Recent Labs Lab 07/26/12 0700 07/28/12 1307 07/29/12 0855  WBC 16.4* 16.3* 8.7  HGB 11.1* 10.8* 10.7*  HCT 32.9* 32.9* 33.2*  MCV 96.5 97.9 99.1  PLT 350 317 281   Cardiac Enzymes:  Recent Labs Lab 07/26/12 0801  TROPONINI <0.30   CBG: No results found for this basename: GLUCAP,  in the last 168 hours  Iron Studies: No results found for this basename: IRON, TIBC, TRANSFERRIN, FERRITIN,  in the last 72 hours Studies/Results: No results found. Medications: . sodium chloride 10 mL/hr (07/26/12 1635)  . amiodarone (NEXTERONE PREMIX) 360 mg/200 mL dextrose 30 mg/hr (07/29/12 0130)   . amLODipine  10 mg Oral Daily  . aspirin EC  325 mg Oral Daily  . calcium acetate  667 mg Oral TID WC  . doxercalciferol  2 mcg Intravenous Q T,Th,Sa-HD  . feeding supplement  1 Container Oral TID BM  . feeding  supplement  30 mL Oral BID  . metoprolol tartrate  25 mg Oral BID  . multivitamin  1 tablet Oral QHS  . pantoprazole  40 mg Oral BID  . sodium chloride  3 mL Intravenous Q12H     Physical Exam: General: Alert,Thin chronically  Ill WF Heart: RRR now Lungs: decreased bs at bases o/w clear Abdomen: BS pos.  Soft, nontender Extremities: Dialysis Access: no pedal edema/ R U A AVF patent on HD   Outpatient HD: [facility] TTS 3.75hrs EDW 59kg Bath 2K/2.5CA F160 Heparin 2000 RUA AVF Hect 2ug EPO 2200   Assessment/Plan:  1. AMS/ status epilepticus- felt due to cefipime toxicity; off seizure meds; previously intubated. MS better but still disoriented 2. ESRD, cont TTS HD. HD today 3. Anemia - Hgb 11 range, holding ESA - ^ ferritin - no Fe 4. MBD/CKD- ^ P slightly then down to 4.3 without binders - and now back up 6.9> 6.0  / CA corrected 9.0  start Phoslo 1 ac /on hectorol 2; 5. HTN/volume - under dry wt but BP high, max UF with HD today. On norvasc and metoprolol 6. Nutrition - on dysphagia diet, MVI 7. Afib/RVR- s/p  amio, back in NSR. Cardiology signed off 8. PNA- original admit diagnosis 9. DNR- full DNR, otherwise family wants "everything done"  Will try recliner attempt today at HD   Christine Moselle  MD [phone] pgr    [phone] cell 07/29/2012, 9:30 AM

## 2012-07-29 NOTE — Procedures (Signed)
I was present at this dialysis session. I have reviewed the session itself and made appropriate changes.   Vinson Moselle, MD [employer] 07/29/2012, 9:37 AM

## 2012-07-29 NOTE — Progress Notes (Signed)
Hemodialysis- BP drop to 57/38 during last 15 minutes. Pt placed in trendelenburg and given saline. Dr. Arlean Hopping on unit. Ok to dc treatment with 15 minutes remaining. Pt was responsive but difficult to arouse throughout incident. Post rinseback bp 117/57, pt is alert/cooperative. No other complaints.

## 2012-07-29 NOTE — Progress Notes (Signed)
Called by CMT pt is back in afib, HR=110, confirmed rhythm with the CMT, checked pt asleep resting in bed, family at bedside. Pt is already on amiodarone drip @ 16.72ml/hr. [name] NP on call notified, no new orders made at this time. Will continue to monitor.

## 2012-07-30 DIAGNOSIS — T07XXXA Unspecified multiple injuries, initial encounter: Secondary | ICD-10-CM

## 2012-07-30 DIAGNOSIS — R1032 Left lower quadrant pain: Secondary | ICD-10-CM

## 2012-07-30 DIAGNOSIS — I4891 Unspecified atrial fibrillation: Secondary | ICD-10-CM

## 2012-07-30 DIAGNOSIS — H6690 Otitis media, unspecified, unspecified ear: Secondary | ICD-10-CM

## 2012-07-30 MED ORDER — DIPHENHYDRAMINE HCL 25 MG PO CAPS: 25.0000 mg | ORAL_CAPSULE | Freq: Four times a day (QID) | ORAL | Status: DC | PRN

## 2012-07-30 MED ORDER — ALPRAZOLAM 0.25 MG PO TABS
0.2500 mg | ORAL_TABLET | Freq: Two times a day (BID) | ORAL | Status: DC | PRN
  Administered 2012-07-30: 0.25 mg via ORAL
  Filled 2012-07-30: qty 1

## 2012-07-30 NOTE — Progress Notes (Signed)
[facility] PROGRESS NOTE  Christine Graham ZOX:[id number] DOB: 27-Mar-1925 DOA: 07/04/2012 PCP: Judie Petit, MD  SUMMARY:  Patient is an 79 year old female with history of end-stage renal disease, hypertension, hyperlipidemia.  She presented on May 27 with an apparent viral syndrome with leukocytosis and pulmonary edema on her chest x-ray. There was concern she had healthcare associated pneumonia and was started on vancomycin and cefepime.  After initiation of antibiotics she had progressive altered mental status that led to obtundation. She had an EEG which demonstrated epileptiform activity and a negative brain MRI. She was loaded with Ativan and Dilantin but was concerned she had nonconvulsive seizures so she was intubated and placed on a propofol drip.  She was extubated on June 7 diet immediately reintubated due to stridor. She was started on Decadron.  She was extubated again on June 11. She continued to have stridor after extubation and was evaluated by ENT.  Laryngoscopy on June 16 demonstrated intubation trauma with ulceration of the right arytenoid and subglottic posterior commissure. She is at risk of progression and subglottic stenosis and will need reevaluation by ENT early next week.  Her antiepileptic medications were discontinued and this is thought to be an acute event due to her cefepime.  She has remained seizure-free.  Strength is improving.  Anticipate discharge to SNF if patient amenable later this week.  Would like ENT and SLP to reevaluate prior to discharge.    Assessment/Plan  Cefepime induced status epilepticus requiring intubation.  Resolved  Postextubation stridor due to vocal cord trauma with eschar and ulceration, resolving.   -  Continue protonix -  Aspiration precautions -  ENT and SLP to reevaluate on Monday  Dysphagia due to vocal cord injury and severe weakness, improving -  Patient and family aware of risk of aspiration within liquids -  Patient is at  risk of dehydration because she refuses thickened liquid -  SLP reevaluation tomorow  Atrial fibrillation with RVR, in NSR x 24 hours. Troponin negative, TSH wnl.  CHAD2vasc score is 4, 4% annual risk of stroke.  Has bled score of 2, 1.88-4.1% annual risk of bleed.  - ASA 81mg  daily - Transitioned to PO amiodarone on 6/21 -  Family and patient to discuss risk/benefit of A/C   Possible TIA on June 18 with possible transient facial droop, tongue deviation, and increased slurred speech -  MRI brain negative for stroke  Severe weakness and deconditioning improving.   -  Continue physical therapy and occupational therapy -  Continue out of bed to chair -  Continue to have patient to sit in the chair for dialysis  Severe protein calorie malnutrition with poor by mouth intake, appetite improving -  May not need appetite stimulant -  Liberalize diet and continue supplements  Acute hypoxic respiratory failure possible due to prolonged intubation and muscle weakness -  Continue IS -  Wean oxygen as tolerated  Diarrhea, resolved, C. difficile negative.    HYPERTENSION, Blood pressures previously elevated but had severe hypotension during dialysis today -  Please hold parameters for Norvasc and metoprolol Acute diastolic CHF (congestive heart failure), compensated  HYPERLIPIDEMIA, stable chronic  ESRD on dialysis T,H,Sa, per nephrology. -  Continue sitting in a chair for dialysis to increase strength    GOUT, asymptomatic  Muscular deconditioning, severe.  To skilled nursing.  Left ear pain.  No evidence of acute otitis media and improving.  Possible a serous effusion.  Continue Auralgan  Lab holiday until Tuesday  Diet:  Dysphagia 2 with thin liquids Access:  Peripheral IV IVF:  Off Proph:  SCDs  Code Status: DO NOT RESUSCITATE Family Communication: Spoke with patient alone Disposition Plan:   Plan for skilled nursing next week. Needs to be reevaluated by ENT and speech therapy  to solidify a diet texture.    Consultants:  Nephrology  CCM signed off  neurohospitalist signed off  ENT [facility] Cardiology Procedures:  ETT 5/29 >> 6/07  R Alligator CVC 5/29 >> 6/12  Rt graft>>>  ETT 6/07 >> 6/11  Laryngoscopy 6/16  Antibiotics:  Vanco 5/27 >> 5/30  Cefepime 5/27 >> 5/30  Zosyn 5/28 x1 dose  Acyclovir 5/29 >> 5/31  HPI/Subjective:  The patient states that she feels stronger and her nausea and appetite are both improving. She denies constipation and diarrhea. She denies shortness of breath but does have some cough.  Ear pain is also improving.  Objective: Filed Vitals:   07/29/12 1400 07/29/12 1954 07/29/12 2026 07/30/12 0534  BP: 118/64 116/68 125/66 160/42  Pulse: 63 65 60 59  Temp: 97.2 F (36.2 C) 98 F (36.7 C) 98 F (36.7 C) 98.4 F (36.9 C)  TempSrc: Oral Oral Axillary Axillary  Resp: 20  18 18   Height:      Weight:    54.2 kg (119 lb 7.8 oz)  SpO2: 100%  100% 99%    Intake/Output Summary (Last 24 hours) at 07/30/12 0756 Last data filed at 07/29/12 1800  Gross per 24 hour  Intake    200 ml  Output   2887 ml  Net  -2687 ml   Filed Weights   07/29/12 0540 07/30/12 0534  Weight: 54.2 kg (119 lb 7.8 oz) 54.2 kg (119 lb 7.8 oz)    Exam:   General:  Caucasian female, No acute distress  HEENT:  NCAT, MMM.  TM on right somewhat dull without effusion or erythema.  Left TM obscured by wax  Cardiovascular:  RRR, nl S1, S2 no mrg, 2+ pulses, warm extremities  Respiratory:  No stridor, CTAB, no increased WOB  Abdomen:  NABS, soft, NT/ND  MSK:  Normal tone and bulk, no LEE  Neuro:  Able to lift her arms and lower legs off the bed.  Grip 4/5.  No facial droop.  Her voice continues to improve.  Data Reviewed: Basic Metabolic Panel:  Recent Labs Lab 07/26/12 0700 07/28/12 1307 07/29/12 0855  NA 129* 133* 136  K 5.1 4.6 3.3*  CL 89* 94* 97  CO2 20 22 27   GLUCOSE 105* 116* 117*  BUN 116* 61* 20  CREATININE 6.92* 5.65* 2.97*   CALCIUM 7.8* 7.9* 8.2*  MG 2.0  --   --   PHOS 6.9* 6.0*  --    Liver Function Tests:  Recent Labs Lab 07/26/12 0700 07/28/12 1307  ALBUMIN 2.8* 2.4*   No results found for this basename: LIPASE, AMYLASE,  in the last 168 hours No results found for this basename: AMMONIA,  in the last 168 hours CBC:  Recent Labs Lab 07/26/12 0700 07/28/12 1307 07/29/12 0855  WBC 16.4* 16.3* 8.7  HGB 11.1* 10.8* 10.7*  HCT 32.9* 32.9* 33.2*  MCV 96.5 97.9 99.1  PLT 350 317 281   Cardiac Enzymes:  Recent Labs Lab 07/26/12 0801  TROPONINI <0.30   BNP (last 3 results)  Recent Labs  07/04/12 0724  PROBNP 45870.0*   CBG: No results found for this basename: GLUCAP,  in the last 168 hours  Recent Results (from the  past 240 hour(s))  CLOSTRIDIUM DIFFICILE BY PCR     Status: None   Collection Time    07/21/12  3:03 PM      Result Value Range Status   C difficile by pcr NEGATIVE  NEGATIVE Final     Studies: Mr Brain Wo Contrast  07/29/2012   *RADIOLOGY REPORT*  Clinical Data: Recent episode of facial droop and somewhat asymmetric weakness.  MRI HEAD WITHOUT CONTRAST  Technique:  Multiplanar, multiecho pulse sequences of the brain and surrounding structures were obtained according to standard protocol without intravenous contrast.  Comparison: Multiple priors.  Findings: The patient had difficulty remaining motionless for the study.  Images are suboptimal.  Small or subtle lesions could be overlooked.  No acute stroke or acute hemorrhage.  1 cm sized right lateral pontine cistern mass lesion with mild restricted diffusion likely meningioma.  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  Advanced atrophy and chronic microvascular ischemic change.  Small focus chronic hemorrhage left globus pallidus.  No midline shift.  No obvious vascular occlusion.  No obvious osseous lesions.  IMPRESSION: Markedly degraded scan due to motion.  Suspect 1 cm meningioma right lateral  pontine cistern mass.  No acute cerebral infarction is identified.  Advanced atrophy and chronic microvascular ischemic change.   Original Report Authenticated By: Davonna Belling, M.D.    Scheduled Meds: . amiodarone  200 mg Oral Daily  . amLODipine  10 mg Oral Daily  . aspirin EC  81 mg Oral Daily  . calcium acetate  667 mg Oral TID WC  . doxercalciferol  2 mcg Intravenous Q T,Th,Sa-HD  . feeding supplement  1 Container Oral TID BM  . feeding supplement  30 mL Oral BID  . metoprolol tartrate  25 mg Oral BID  . multivitamin  1 tablet Oral QHS  . pantoprazole  40 mg Oral BID  . sodium chloride  3 mL Intravenous Q12H   Continuous Infusions: . sodium chloride 10 mL/hr (07/26/12 1635)    Active Problems:   HYPERLIPIDEMIA   GOUT   HYPERTENSION   ESRD on dialysis   Acute encephalopathy   Acute diastolic CHF (congestive heart failure)   Acute respiratory failure   Status epilepticus   Muscular deconditioning   Postextubation stridor   Diarrhea   Other dysphagia   Trauma to vocal cord   Atrial fibrillation    Time spent: 30 min    Baneen Wieseler, [facility]  [facility] Pager [phone]. If 7PM-7AM, please contact night-coverage at [email or web address], password [facility] 07/30/2012, 7:56 AM  LOS: 26 days

## 2012-07-30 NOTE — Progress Notes (Addendum)
Patient was transferred from 47 and CSW discussed patient with Genelle Bal, LCSW.  She is a Hemodialysis patient who has experienced multiple medical issues since her admission to the hospital on 07/04/12.  Patient discussed with Dr. Malachi Bonds this afternoon.  Currently, family is wanting to active treatment for patient based on her prior level of functioning.  CSW discussed concern with Dr. Malachi Bonds that she is currently considered "stretcher bound dialysis.  If this remains the case- SNF placement will be next to impossible to arrange.  Family would like CIR but her insurance will not cover CIR placement.  Lorri Frederick. [place] Pugh  [phone]

## 2012-07-30 NOTE — Progress Notes (Signed)
Dr. Lamar Blinks notes are greatly appreciated.  Family is requesting CIR consult - however patient's insurance will not allow placement in CIR.  SNF search is active and in place.  CSW will discuss with family tomorrow  Per MD note- they do not patient approached regarding SNF placement but as her mentation improves she needs to be included in her d/c planning choices.   Lorri Frederick. [place] P[place]  [phone]

## 2012-07-30 NOTE — Progress Notes (Signed)
Pt weaned to room air-sats maintained at 98%. Pt up to chair with max assist on transfer. Pt adamantly refuses a bath this am. Numerous offers to wash hair turned down.

## 2012-07-30 NOTE — Progress Notes (Signed)
Family meeting set for 3PM 6/22 with son Siennah Barrasso and patient's daughter Katy Fitch. They are requesting to meet outside of the room first, son has HCPOA and is requesting any conversation about long term care not be discussed in front of his mother, however if she continues to improve her mental status will need to make sure she is informed and is a partner in decision making since HCPOA on active if patient unable to make her own decisions. Some of the issues about her goals may be complicated by conflicting family-patient ideas about future planning, possibilities, and recovery potential. May need to formally evaluate patient's capacity-she is mostly appropriate but there is clearly some residual confusion based on her pre-admission functional status which was driving herself to HD and fully independent. Full consult to follow.  Anderson Malta, DO Palliative Medicine

## 2012-07-30 NOTE — Consult Note (Signed)
Palliative Medicine Team at [facility]  Date: 07/30/2012   Patient Name: Christine Graham  DOB: 09-18-1925  MRN: [id number]  Age / Sex: 79 y.o., female   PCP: Lindley Magnus, MD Referring Physician: Renae Fickle, MD  HPI/Reason for Consultation: 79 yo woman with ESRD, HTN and A-Fib admitted with dyspnea, nausea, diarrhea and cough treated empirically for HCAP with cefepime and had severe reaction which led to subclinical seizure with status epilepticus requiring ICU transfer and short intubation for airway protection. She sustained a vocal cord injury during intubation and had stridor post-extubation. She is ESRD and has been on HD for the past 1.5 years. Her functional status prior to admission was VERY HIGH- she drove herself to HD, she went dancing with her 3 yo boyfriend and according to her family wore out her prior boyfriend with her energy level.   Participants in Discussion: Son Joanny Dupree, daughter Katy Fitch and DIL, who is a Teacher, early years/pre.  Goals/Summary of Discussion:  1. DNR 2. Continue aggressive medical treatment including attempts to regain her strength and prior functional status. 3. Needs very close attention to transitions of care-family have expressed frustrations with having to tell her story multiple times to providers, have had to be constant advocates for her basic needs and overall expressed a sense of disappointment with how the hospitalization has evolved. I gave them the number for office of patient experience and provided genuine empathy and apologies for their experience. 4. Key issue here is that patient's pre-admission functional status and QOL was very high. 5. Patient has been sitting up much of the day-tolerated HD today without problems, is a 1 person transfer and eating better.   Family interested in having her evaluated by CIR given her improvement  Family have identified SNF choices if CIR not an option  Recommended Palliative Care follow at SNF if  this is the care path.  No identified need for formal capacity determination at this point, patient has fluctuation of her mental status including delirium.  I also discussed overall big picture including QOL, complications of HD, stroke risk and explored a variety of possible disease trajectories. They report they have clear advance directives and have had these discussions regularly since starting HD.   Educational Materials Given:  DNR: Yes  MOST: No Healthcare Power-of-Attorney: Yes    Time: 50 minutes Greater than 50%  of this time was spent counseling and coordinating care related to the above assessment and plan.  Signed by: Edsel Petrin, DO  07/30/2012, 3:56 PM  Please contact Palliative Medicine Team phone at [phone] for questions and concerns.

## 2012-07-30 NOTE — Progress Notes (Signed)
Subjective:  NO complaints.  No sob , +cough  Objective Vital signs in last 24 hours: Filed Vitals:   07/29/12 1400 07/29/12 1954 07/29/12 2026 07/30/12 0534  BP: 118/64 116/68 125/66 160/42  Pulse: 63 65 60 59  Temp: 97.2 F (36.2 C) 98 F (36.7 C) 98 F (36.7 C) 98.4 F (36.9 C)  TempSrc: Oral Oral Axillary Axillary  Resp: 20  18 18   Height:      Weight:    54.2 kg (119 lb 7.8 oz)  SpO2: 100%  100% 99%   Weight change: -3.3 kg (-7 lb 4.4 oz)  Intake/Output Summary (Last 24 hours) at 07/30/12 0720 Last data filed at 07/29/12 1800  Gross per 24 hour  Intake    200 ml  Output   2887 ml  Net  -2687 ml   Labs: Basic Metabolic Panel:  Recent Labs Lab 07/26/12 0700 07/28/12 1307 07/29/12 0855  NA 129* 133* 136  K 5.1 4.6 3.3*  CL 89* 94* 97  CO2 20 22 27   GLUCOSE 105* 116* 117*  BUN 116* 61* 20  CREATININE 6.92* 5.65* 2.97*  CALCIUM 7.8* 7.9* 8.2*  PHOS 6.9* 6.0*  --    Liver Function Tests:  Recent Labs Lab 07/26/12 0700 07/28/12 1307  ALBUMIN 2.8* 2.4*   No results found for this basename: LIPASE, AMYLASE,  in the last 168 hours No results found for this basename: AMMONIA,  in the last 168 hours CBC:  Recent Labs Lab 07/26/12 0700 07/28/12 1307 07/29/12 0855  WBC 16.4* 16.3* 8.7  HGB 11.1* 10.8* 10.7*  HCT 32.9* 32.9* 33.2*  MCV 96.5 97.9 99.1  PLT 350 317 281   Cardiac Enzymes:  Recent Labs Lab 07/26/12 0801  TROPONINI <0.30   CBG: No results found for this basename: GLUCAP,  in the last 168 hours  Iron Studies: No results found for this basename: IRON, TIBC, TRANSFERRIN, FERRITIN,  in the last 72 hours Studies/Results: Mr Brain Wo Contrast  07/29/2012   *RADIOLOGY REPORT*  Clinical Data: Recent episode of facial droop and somewhat asymmetric weakness.  MRI HEAD WITHOUT CONTRAST  Technique:  Multiplanar, multiecho pulse sequences of the brain and surrounding structures were obtained according to standard protocol without intravenous  contrast.  Comparison: Multiple priors.  Findings: The patient had difficulty remaining motionless for the study.  Images are suboptimal.  Small or subtle lesions could be overlooked.  No acute stroke or acute hemorrhage.  1 cm sized right lateral pontine cistern mass lesion with mild restricted diffusion likely meningioma.  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  Advanced atrophy and chronic microvascular ischemic change.  Small focus chronic hemorrhage left globus pallidus.  No midline shift.  No obvious vascular occlusion.  No obvious osseous lesions.  IMPRESSION: Markedly degraded scan due to motion.  Suspect 1 cm meningioma right lateral pontine cistern mass.  No acute cerebral infarction is identified.  Advanced atrophy and chronic microvascular ischemic change.   Original Report Authenticated By: Davonna Belling, M.D.   Medications: . sodium chloride 10 mL/hr (07/26/12 1635)   . amiodarone  200 mg Oral Daily  . amLODipine  10 mg Oral Daily  . aspirin EC  81 mg Oral Daily  . calcium acetate  667 mg Oral TID WC  . doxercalciferol  2 mcg Intravenous Q T,Th,Sa-HD  . feeding supplement  1 Container Oral TID BM  . feeding supplement  30 mL Oral BID  . metoprolol tartrate  25 mg Oral BID  .  multivitamin  1 tablet Oral QHS  . pantoprazole  40 mg Oral BID  . sodium chloride  3 mL Intravenous Q12H     Physical Exam: General: Alert,Thin chronically  Ill WF Heart: RRR now Lungs: decreased bs at bases o/w clear Abdomen: BS pos.  Soft, nontender Extremities: Dialysis Access: no pedal edema/ R U A AVF patent on HD   Outpatient HD: [facility] 3.75hrs EDW 59kg Bath 2K/2.5CA F160 Heparin 2000 RUA AVF Hect 2ug EPO 2200   Assessment/Plan:  1. AMS/ status epilepticus- felt due to cefipime toxicity; off seizure meds; previously intubated. MS improving slowly 2. ESRD, cont TTS HD. HD Tuesday 3. Anemia - Hgb 11 range, holding ESA - ^ ferritin - no Fe                                           4. HTN/volume - . On norvasc / metoprolol. 2.8kg off Sat w late BP drop. Probably at new dry wt now ~54kg 5. MBD/CKD- ^ P slightly then down to 4.3 without binders - and now back up 6.9> 6.0  / CA corrected 9.0  started Phoslo 1 ac /on hectorol 2; 6. Nutrition - on dysphagia diet, MVI 7. Afib/RVR- s/p amio, back in NSR. Cardiology signed off 8. PNA- original admit diagnosis 9. DNR- full DNR, otherwise family wants "everything done"  Did HD in recliner yesterday and pt did well.  Will need SNF placement for marked debility and is getting close to being strong enough to get outpt HD. Discussed w patient who was said she was "not going anywhere for Rehab".     Vinson Moselle  MD [phone] pgr    [phone] cell 07/30/2012, 7:20 AM

## 2012-07-31 ENCOUNTER — Inpatient Hospital Stay (HOSPITAL_COMMUNITY): Payer: Medicare Other

## 2012-07-31 DIAGNOSIS — R1032 Left lower quadrant pain: Secondary | ICD-10-CM

## 2012-07-31 DIAGNOSIS — H6691 Otitis media, unspecified, right ear: Secondary | ICD-10-CM

## 2012-07-31 LAB — CBC
MCH: 32 pg (ref 26.0–34.0)
Platelets: 303 10*3/uL (ref 150–400)
RBC: 3.47 MIL/uL — ABNORMAL LOW (ref 3.87–5.11)
RDW: 17.1 % — ABNORMAL HIGH (ref 11.5–15.5)

## 2012-07-31 LAB — URINALYSIS, ROUTINE W REFLEX MICROSCOPIC
Glucose, UA: NEGATIVE mg/dL
Specific Gravity, Urine: 1.02 (ref 1.005–1.030)
pH: 8 (ref 5.0–8.0)

## 2012-07-31 LAB — COMPREHENSIVE METABOLIC PANEL
ALT: 19 U/L (ref 0–35)
AST: 21 U/L (ref 0–37)
Albumin: 2.3 g/dL — ABNORMAL LOW (ref 3.5–5.2)
CO2: 22 mEq/L (ref 19–32)
Calcium: 8.4 mg/dL (ref 8.4–10.5)
GFR calc non Af Amer: 8 mL/min — ABNORMAL LOW (ref >90)
Sodium: 126 mEq/L — ABNORMAL LOW (ref 135–145)
Total Protein: 6.2 g/dL (ref 6.0–8.3)

## 2012-07-31 LAB — URINE MICROSCOPIC-ADD ON

## 2012-07-31 MED ORDER — DARBEPOETIN ALFA-POLYSORBATE 40 MCG/0.4ML IJ SOLN
40.0000 ug | INTRAMUSCULAR | Status: DC
  Filled 2012-07-31: qty 0.4

## 2012-07-31 MED ORDER — PANTOPRAZOLE SODIUM 40 MG PO PACK
40.0000 mg | PACK | Freq: Two times a day (BID) | ORAL | Status: DC
  Administered 2012-07-31 – 2012-08-02 (×4): 40 mg
  Filled 2012-07-31 (×8): qty 20

## 2012-07-31 MED ORDER — AMOXICILLIN-POT CLAVULANATE 500-125 MG PO TABS
1.0000 | ORAL_TABLET | Freq: Every day | ORAL | Status: DC
  Administered 2012-07-31 – 2012-08-01 (×2): 500 mg via ORAL
  Filled 2012-07-31 (×3): qty 1

## 2012-07-31 MED ORDER — NEPRO/CARBSTEADY PO LIQD
237.0000 mL | Freq: Two times a day (BID) | ORAL | Status: DC
  Administered 2012-07-31: 237 mL via ORAL
  Administered 2012-08-01: 10:00:00 via ORAL
  Administered 2012-08-02: 237 mL via ORAL
  Filled 2012-07-31 (×7): qty 237

## 2012-07-31 MED ORDER — METOPROLOL TARTRATE 12.5 MG HALF TABLET
12.5000 mg | ORAL_TABLET | Freq: Two times a day (BID) | ORAL | Status: DC
  Administered 2012-07-31 – 2012-08-02 (×3): 12.5 mg via ORAL
  Filled 2012-07-31 (×5): qty 1

## 2012-07-31 MED ORDER — ACETAMINOPHEN 160 MG/5ML PO SOLN
650.0000 mg | ORAL | Status: DC | PRN
  Administered 2012-07-31 – 2012-08-01 (×2): 650 mg via ORAL
  Filled 2012-07-31 (×2): qty 20.3

## 2012-07-31 NOTE — Progress Notes (Signed)
NUTRITION FOLLOW UP  Intervention:   1. D/c Ensure pudding 2. Nepro Shake po BID, each supplement provides 425 kcal and 19 grams protein.   Nutrition Dx:   Swallowing difficulty r/t dysphagia and recent prolonged intubation AEB need for dysphagia diet and ST. Ongoing.  Goal:   Pt to meet >/=90% estimated nutrition needs. improving  Monitor:   Po intake, weight trends, labs, I/O's  Assessment:   Pt continues on HD, able to tolerate sitting up for treatment. Family has decided on continued aggressive treatment. Pt eating better at this time. Does not like Ensure pudding. Per notes family not consistently providing thickened liquids to pt. Pt willing to try Nepro shakes (nectar thick at room temp and chilled).   Height: Ht Readings from Last 1 Encounters:  07/26/12 5\' 5"  (1.651 m)    Weight Status:   Wt Readings from Last 1 Encounters:  07/31/12 118 lb 11.2 oz (53.842 kg)  Weight trending down with HD   Re-estimated needs:  Kcal: 1500-1700 Protein: 66-80 gm  Fluid: 1.2 L   Skin: stage I PU on back, wound on sacrum  Diet Order: Dysphagia 2, thin liquids    Intake/Output Summary (Last 24 hours) at 07/31/12 1157 Last data filed at 07/31/12 0834  Gross per 24 hour  Intake    500 ml  Output    275 ml  Net    225 ml  - 4.8 L this admission   Last BM: 6/21   Labs:   Recent Labs Lab 07/26/12 0700 07/28/12 1307 07/29/12 0855  NA 129* 133* 136  K 5.1 4.6 3.3*  CL 89* 94* 97  CO2 20 22 27   BUN 116* 61* 20  CREATININE 6.92* 5.65* 2.97*  CALCIUM 7.8* 7.9* 8.2*  MG 2.0  --   --   PHOS 6.9* 6.0*  --   GLUCOSE 105* 116* 117*    CBG (last 3)  No results found for this basename: GLUCAP,  in the last 72 hours  Scheduled Meds: . amiodarone  200 mg Oral Daily  . amLODipine  10 mg Oral Daily  . aspirin EC  81 mg Oral Daily  . calcium acetate  667 mg Oral TID WC  . [START ON 08/01/2012] darbepoetin (ARANESP) injection - DIALYSIS  40 mcg Intravenous Q Tue-HD  .  doxercalciferol  2 mcg Intravenous Q T,Th,Sa-HD  . feeding supplement  1 Container Oral TID BM  . feeding supplement  30 mL Oral BID  . metoprolol tartrate  25 mg Oral BID  . multivitamin  1 tablet Oral QHS  . pantoprazole sodium  40 mg Per Tube BID  . sodium chloride  3 mL Intravenous Q12H    Continuous Infusions: . sodium chloride 10 mL/hr (07/26/12 1635)    Clarene Duke RD, LDN Pager [phone] After Hours pager [phone]

## 2012-07-31 NOTE — Progress Notes (Signed)
[facility] PROGRESS NOTE  Christine Graham:[id number] DOB: 07-14-1925 DOA: 07/04/2012 PCP: Judie Petit, MD  SUMMARY:  Patient is an 79 year old female with history of end-stage renal disease, hypertension, hyperlipidemia.  She presented on May 27 with an apparent viral syndrome with leukocytosis and pulmonary edema on her chest x-ray. There was concern she had healthcare associated pneumonia and was started on vancomycin and cefepime.  After initiation of antibiotics she had progressive altered mental status that led to obtundation. She had an EEG which demonstrated epileptiform activity and a negative brain MRI. She was loaded with Ativan and Dilantin but was concerned she had nonconvulsive seizures so she was intubated and placed on a propofol drip.  She was extubated on June 7 diet immediately reintubated due to stridor. She was started on Decadron.  She was extubated again on June 11. She continued to have stridor after extubation and was evaluated by ENT.  Laryngoscopy on June 16 demonstrated intubation trauma with ulceration of the right arytenoid and subglottic posterior commissure. She is at risk of progression and subglottic stenosis and will need reevaluation by ENT early next week.  Her antiepileptic medications were discontinued and this is thought to be an acute event due to her cefepime.  She has remained seizure-free.  Strength is improving.  Anticipate discharge to SNF if patient amenable later this week.  Would like ENT and SLP to reevaluate prior to discharge.    Assessment/Plan  Cefepime induced status epilepticus requiring intubation.  Resolved  Postextubation stridor due to vocal cord trauma with eschar and ulceration, increased stridor today on exam.   -  Continue protonix  -  ENT called back to say they would reevaluate Wednesday morning  Dysphagia due to vocal cord injury and severe weakness, improving -  Patient and family aware of risk of aspiration with thin  liquids -  Patient is at risk of dehydration because she refuses thickened liquid -  Appreciate SLP and nutrition recommendations  Left ear pain.  Possibly some dullness to the eardrum, but no surrounding erythema.  The pain has not gotten better and may be getting worse.  Will treat for right acute otitis media -  Continue Auralgan and warm compresses -  Augmentin renally dosed  Left lower quadrant pain. Occurred only during exam.  Differential diagnoses include urinary tract infection, diverticulitis, gas pains, constipation. She had a bowel movement which was normal just a few hours ago. -  CBC: White blood cell count close to normal limits -  Liver function tests and lipase: Within normal limits -  Urinalysis pending -  KUB:  Pending  Atrial fibrillation with RVR, in NSR x 24 hours. Troponin negative, TSH wnl.  CHAD2vasc score is 4, 4% annual risk of stroke.  Has bled score of 2, 1.88-4.1% annual risk of bleed.  Has some sinus bradycardia to the 50s, but is hemodynamically stable and asymptomatic.   - ASA 81mg  daily - Transitioned to PO amiodarone on 6/21 -  Family and patient to discuss risk/benefit of A/C  -  Consider decreasing the dose of beta blocker if this persists or if the patient becomes hypotensive or symptomatic  Possible TIA on June 18 with possible transient facial droop, tongue deviation, and increased slurred speech -  MRI brain negative for stroke  Severe weakness and deconditioning improving.   -  Continue physical therapy and occupational therapy -  Continue out of bed to chair -  Continue to have patient to sit in the chair  for dialysis  Severe protein calorie malnutrition with poor by mouth intake, appetite improving but states she feels full after her meals -  Liberalize diet and continue supplements -  Consider Reglan   Acute hypoxic respiratory failure possible due to prolonged intubation and muscle weakness -  Continue IS -  Wean oxygen as  tolerated  Diarrhea, resolved, C. difficile negative.    HYPERTENSION, Blood pressures previously elevated but had severe hypotension during dialysis today -  Continue hold parameters for Norvasc and metoprolol Acute diastolic CHF (congestive heart failure), compensated  HYPERLIPIDEMIA, stable chronic  ESRD on dialysis T,H,Sa, per nephrology. -  Continue sitting in a chair for dialysis to increase strength    GOUT, asymptomatic  Muscular deconditioning, severe.  To skilled nursing.  Lab holiday until Tuesday  Diet:  Dysphagia 2 with thin liquids Access:  Peripheral IV IVF:  Off Proph:  SCDs  Code Status: DO NOT RESUSCITATE Family Communication: Spoke with patient alone Disposition Plan:   Plan for skilled nursing next week. ENT reevaluation on Wed and anticipate tx to SNF Wed afternoon.   The family would like to be the ones to communicate with the patient regarding her transition to skilled nursing. They are worried that the staff would cause unnecessary anxiety for the patient by relaying information about her transfering to skilled nursing directly.  Although the patient has had delirium and is clearly not able to make decisions for her herself, the family is still concerned that we would bypass them and ask the patient to make decisions regarding her discharge to skilled nursing.  The patient does have some times of lucidity and her mentation has been gradually improving, and I warned the son that not telling the patient about the possibility of transfer on Wednesday could cause unnecessary anxiety at the time of transfer. She may be shocked if the transport personnel arrived to move her to a different location and she had no idea that this was going to happen.  He voiced understanding and said that he would continue to communicate with his mother regarding our plans.   I reassured him that we would not be asking his mother to make any decisions regarding which skilled nursing facility  to go to from here.    Consultants:  Nephrology  CCM signed off  neurohospitalist signed off  ENT [facility] Cardiology Procedures:  ETT 5/29 >> 6/07  R [place] CVC 5/29 >> 6/12  Rt graft>>>  ETT 6/07 >> 6/11  Laryngoscopy 6/16  Antibiotics:  Vanco 5/27 >> 5/30  Cefepime 5/27 >> 5/30  Zosyn 5/28 x1 dose  Acyclovir 5/29 >> 5/31  HPI/Subjective:  The patient states that she continues to have right ear pain which is intermittent. She has been receiving frequent Tylenol, ear drops, and warm compresses with no relief.  Her appetite is improving, however still poor overall. She denies diarrhea, constipation, difficult in breathing.  Objective: Filed Vitals:   07/30/12 2123 07/31/12 0619 07/31/12 1054 07/31/12 1554  BP: 176/43 145/50 150/60 159/44  Pulse: 66 58 60 58  Temp: 98.5 F (36.9 C) 97.9 F (36.6 C)  98.1 F (36.7 C)  TempSrc: Oral Oral  Oral  Resp: 18 16  17   Height:      Weight:  53.842 kg (118 lb 11.2 oz)    SpO2: 100% 98% 100% 98%    Intake/Output Summary (Last 24 hours) at 07/31/12 1818 Last data filed at 07/31/12 1557  Gross per 24 hour  Intake  380 ml  Output    325 ml  Net     55 ml   Filed Weights   07/29/12 0540 07/30/12 0534 07/31/12 0619  Weight: 54.2 kg (119 lb 7.8 oz) 54.2 kg (119 lb 7.8 oz) 53.842 kg (118 lb 11.2 oz)    Exam:   General:  Caucasian female, No acute distress  HEENT:  NCAT, MMM.  TM on right dull without surrounding erythema.   Cardiovascular:  RRR, nl S1, S2 no mrg, 2+ pulses, warm extremities  Respiratory:  Stridulous, particularly with deep inspiration, no rales or rhonchi or wheeze, no increased WOB  Abdomen:  NABS, soft,  nondistended, focal tenderness to palpation in the left lower quadrant without rebound or guarding   MSK:  Normal tone and bulk, no LEE  Neuro:  Able to lift her arms and lower legs off the bed.  Grip 4/5.  No facial droop.  Her voice continues to improve.  Data Reviewed: Basic Metabolic  Panel:  Recent Labs Lab 07/26/12 0700 07/28/12 1307 07/29/12 0855 07/31/12 1711  NA 129* 133* 136 126*  K 5.1 4.6 3.3* 4.9  CL 89* 94* 97 88*  CO2 20 22 27 22   GLUCOSE 105* 116* 117* 91  BUN 116* 61* 20 47*  CREATININE 6.92* 5.65* 2.97* 4.74*  CALCIUM 7.8* 7.9* 8.2* 8.4  MG 2.0  --   --   --   PHOS 6.9* 6.0*  --   --    Liver Function Tests:  Recent Labs Lab 07/26/12 0700 07/28/12 1307 07/31/12 1711  AST  --   --  21  ALT  --   --  19  ALKPHOS  --   --  95  BILITOT  --   --  0.1*  PROT  --   --  6.2  ALBUMIN 2.8* 2.4* 2.3*    Recent Labs Lab 07/31/12 1711  LIPASE 66*   No results found for this basename: AMMONIA,  in the last 168 hours CBC:  Recent Labs Lab 07/26/12 0700 07/28/12 1307 07/29/12 0855 07/31/12 1711  WBC 16.4* 16.3* 8.7 10.3  HGB 11.1* 10.8* 10.7* 11.1*  HCT 32.9* 32.9* 33.2* 33.4*  MCV 96.5 97.9 99.1 96.3  PLT 350 317 281 303   Cardiac Enzymes:  Recent Labs Lab 07/26/12 0801  TROPONINI <0.30   BNP (last 3 results)  Recent Labs  07/04/12 0724  PROBNP 45870.0*   CBG: No results found for this basename: GLUCAP,  in the last 168 hours  No results found for this or any previous visit (from the past 240 hour(s)).   Studies: No results found.  Scheduled Meds: . amiodarone  200 mg Oral Daily  . amLODipine  10 mg Oral Daily  . amoxicillin-clavulanate  1 tablet Oral q1800  . aspirin EC  81 mg Oral Daily  . calcium acetate  667 mg Oral TID WC  . [START ON 08/01/2012] darbepoetin (ARANESP) injection - DIALYSIS  40 mcg Intravenous Q Tue-HD  . doxercalciferol  2 mcg Intravenous Q T,Th,Sa-HD  . feeding supplement (NEPRO CARB STEADY)  237 mL Oral BID BM  . feeding supplement  30 mL Oral BID  . metoprolol tartrate  12.5 mg Oral BID  . multivitamin  1 tablet Oral QHS  . pantoprazole sodium  40 mg Per Tube BID  . sodium chloride  3 mL Intravenous Q12H   Continuous Infusions: . sodium chloride 10 mL/hr (07/26/12 1635)    Active  Problems:   HYPERLIPIDEMIA  GOUT   HYPERTENSION   ESRD on dialysis   Acute encephalopathy   Acute diastolic CHF (congestive heart failure)   Acute respiratory failure   Status epilepticus   Muscular deconditioning   Postextubation stridor   Diarrhea   Other dysphagia   Trauma to vocal cord   Atrial fibrillation    Time spent: 30 min    Jeremey Bascom, [facility]  [facility] Pager [phone]. If 7PM-7AM, please contact night-coverage at [email or web address], password [facility] 07/31/2012, 6:18 PM  LOS: 27 days

## 2012-07-31 NOTE — Progress Notes (Signed)
Physical medicine and rehabilitation consult requested in chart has been reviewed. As per note by social workers 07/30/2012 times 7:56 PM insurance will not allow placement and CIR thus recommendations are made for skilled nursing facility. At this time as review of chart appears skilled nursing facility would be best option. Will hold on formal rehabilitation consult at this time

## 2012-07-31 NOTE — Progress Notes (Signed)
CSW contacted patient's daughter Jasmine December and son Arlys John today after talking with Dr. Malachi Bonds- patient will hopefully be medically ready for d/c by Weds; awaiting EMT to check on patient.  Patient's son Arlys John is POA and he verbalized that he does not want this CSW or any staff member talking to his mother about SNF placement and feels that this will upset her. He said that he was her HCPOA and that he and his sister would make all decisions and arrangements for his mother.  Per discussion with Dr. Malachi Bonds- patient is now more alert and oriented; CSW offered son support to help talk to his mother about the benefits of rehab and that it may be more upsetting to her on the day of d/c if she hasn't had time to prepare for SNF move.  Son remained adamant that his mother not be contacted by anyone and he was reassured that CSW would not approach his mother without his knowledge.  SNF placement process was discussed and family is hoping for bed availability at [facility]. Facility will need updated information sent to them for review.   CSW received call later from Dr. Malachi Bonds- she has received a call from patient's son who has requested that this CSW be removed from his mother's case.  CSW notified Hope Rife, Psychiatric nurse of Clinical SW of above.  She recommended that Genelle Bal, LCSW take over patient's case as she worked with patient and family when she was on 6700.  CSW notified Ms Okey Dupre of above and she will contact family tomorrow.  This CSW will sign off per son's wishes.  Lorri Frederick. [place]  [phone]

## 2012-07-31 NOTE — Progress Notes (Signed)
Speech Language Pathology Dysphagia Treatment Patient Details Name: Christine Graham MRN: [id number] DOB: 1926/01/11 Today's Date: 07/31/2012 Time: 4098-1191 SLP Time Calculation (min): 15 min  Assessment / Plan / Recommendation Clinical Impression  F/u for swallowing:  Pt's family has met with Palliative Medicine and decided to pursue aggressive medical treatment to regain pt's strength and prior functional status.  She has been on a PO diet (dysphagia 2, thin liquids) for several days subsequent to last week's FEES, and despite severity of dysphagia, has been coping relatively well without developing changes in lung condition/pna.   Today, this clinician assisted pt with lunch.  She demonstrates outward signs of aspiration with thin liquids, with immediate, wet cough.  Solids are consumed with slowed mastication, but she is functional.  Recommend continuing Dysphagia 2, thin liquids as desired per family; follow standard precautions to minimize aspiration risk.  SLP will follow-up  with family to determine how aggressive they wish to be with dysphagia management.      Diet Recommendation  Continue with Current Diet: Dysphagia 2 (fine chop);Thin liquid (per family request)    SLP Plan Continue with current plan of care   Pertinent Vitals/Pain None indicated   Swallowing Goals  SLP Swallowing Goals Patient will consume recommended diet without observed clinical signs of aspiration with: Supervision/safety;Minimal assistance Patient will utilize recommended strategies during swallow to increase swallowing safety with: Supervision/safety;Minimal assistance  General Temperature Spikes Noted: No Respiratory Status: Supplemental O2 delivered via (comment) Behavior/Cognition: Alert;Cooperative;Confused Oral Cavity - Dentition: Adequate natural dentition Patient Positioning: Upright in bed  Oral Cavity - Oral Hygiene Does patient have any of the following "at risk" factors?: Oxygen therapy -  cannula, mask, simple oxygen devices Brush patient's teeth BID with toothbrush (using toothpaste with fluoride): Yes   Dysphagia Treatment Treatment focused on: Skilled observation of diet tolerance;Patient/family/caregiver education Family/Caregiver Educated: Significant other Treatment Methods/Modalities: Skilled observation Patient observed directly with PO's: Yes Type of PO's observed: Dysphagia 2 (chopped);Thin liquids Feeding: Needs assist Liquids provided via: Cup;Teaspoon Oral Phase Signs & Symptoms: Prolonged mastication Pharyngeal Phase Signs & Symptoms: Multiple swallows;Immediate cough Type of cueing: Verbal;Tactile Amount of cueing: Minimal   GO    Christine Graham L. Christine Graham, [place] CCC/SLP Pager [phone]  Blenda Mounts Christine Graham 07/31/2012, 1:39 PM

## 2012-07-31 NOTE — Progress Notes (Signed)
S:No SOB No CP  EAting some O:BP 145/50  Pulse 58  Temp(Src) 97.9 F (36.6 C) (Oral)  Resp 16  Ht 5\' 5"  (1.651 m)  Wt 53.842 kg (118 lb 11.2 oz)  BMI 19.75 kg/m2  SpO2 98%  Intake/Output Summary (Last 24 hours) at 07/31/12 0830 Last data filed at 07/31/12 0305  Gross per 24 hour  Intake    620 ml  Output    275 ml  Net    345 ml   Weight change: -0.358 kg (-12.6 oz) MWN:UUVOZ and alert CVS:RRR Resp:Scattered rhonchi Abd:+ BS NTND Ext:no edema  RUA AVF + bruit NEURO:CNI Ox3 no asterixis   . amiodarone  200 mg Oral Daily  . amLODipine  10 mg Oral Daily  . aspirin EC  81 mg Oral Daily  . calcium acetate  667 mg Oral TID WC  . doxercalciferol  2 mcg Intravenous Q T,Th,Sa-HD  . feeding supplement  1 Container Oral TID BM  . feeding supplement  30 mL Oral BID  . metoprolol tartrate  25 mg Oral BID  . multivitamin  1 tablet Oral QHS  . pantoprazole  40 mg Oral BID  . sodium chloride  3 mL Intravenous Q12H   No results found. BMET    Component Value Date/Time   NA 136 07/29/2012 0855   K 3.3* 07/29/2012 0855   CL 97 07/29/2012 0855   CO2 27 07/29/2012 0855   GLUCOSE 117* 07/29/2012 0855   GLUCOSE 98 12/20/2005 1108   BUN 20 07/29/2012 0855   CREATININE 2.97* 07/29/2012 0855   CALCIUM 8.2* 07/29/2012 0855   CALCIUM 9.0 07/10/2010 1125   GFRNONAA 13* 07/29/2012 0855   GFRAA 16* 07/29/2012 0855   CBC    Component Value Date/Time   WBC 8.7 07/29/2012 0855   RBC 3.35* 07/29/2012 0855   HGB 10.7* 07/29/2012 0855   HCT 33.2* 07/29/2012 0855   PLT 281 07/29/2012 0855   MCV 99.1 07/29/2012 0855   MCH 31.9 07/29/2012 0855   MCHC 32.2 07/29/2012 0855   RDW 18.4* 07/29/2012 0855   LYMPHSABS 2.1 07/14/2012 0350   MONOABS 1.4* 07/14/2012 0350   EOSABS 0.3 07/14/2012 0350   BASOSABS 0.1 07/14/2012 0350     Assessment: 1.  Seizures ? Sec Cefepime 2. Paroxysmal A FIB 3. HTN 4. Anemia resume EPO 5. Sec HPTH on hectorol Plan: 1. HD in AM 2. Check labs at HD 3. Resume EPO 4. Increase  strength with PT   Alli Jasmer T

## 2012-07-31 NOTE — Progress Notes (Signed)
Patient request something for sleep and c/o difficulty breathing. O2 sats 100% on 2l [place]. Xanax .25 given. Patient asleep.

## 2012-08-01 LAB — RENAL FUNCTION PANEL
CO2: 21 mEq/L (ref 19–32)
Chloride: 86 mEq/L — ABNORMAL LOW (ref 96–112)
GFR calc Af Amer: 8 mL/min — ABNORMAL LOW (ref >90)
GFR calc non Af Amer: 7 mL/min — ABNORMAL LOW (ref >90)
Glucose, Bld: 87 mg/dL (ref 70–99)
Potassium: 4.8 mEq/L (ref 3.5–5.1)
Sodium: 126 mEq/L — ABNORMAL LOW (ref 135–145)

## 2012-08-01 LAB — URINE CULTURE

## 2012-08-01 LAB — CBC
HCT: 30.6 % — ABNORMAL LOW (ref 36.0–46.0)
Hemoglobin: 10.2 g/dL — ABNORMAL LOW (ref 12.0–15.0)
WBC: 9.5 10*3/uL (ref 4.0–10.5)

## 2012-08-01 MED ORDER — AMIODARONE HCL 200 MG PO TABS: 200.0000 mg | ORAL_TABLET | Freq: Every day | ORAL | Status: AC

## 2012-08-01 MED ORDER — AMOXICILLIN-POT CLAVULANATE 500-125 MG PO TABS: 1.0000 | ORAL_TABLET | Freq: Every day | ORAL | Status: DC

## 2012-08-01 MED ORDER — PRO-STAT SUGAR FREE PO LIQD: 30.0000 mL | Freq: Two times a day (BID) | ORAL | Status: DC

## 2012-08-01 MED ORDER — PHENOL 1.4 % MT LIQD: 1.0000 | OROMUCOSAL | Status: DC | PRN

## 2012-08-01 MED ORDER — ANTIPYRINE-BENZOCAINE 5.4-1.4 % OT SOLN: 3.0000 [drp] | OTIC | Status: DC | PRN

## 2012-08-01 MED ORDER — DOXERCALCIFEROL 4 MCG/2ML IV SOLN
INTRAVENOUS | Status: AC
  Administered 2012-08-01: 2 ug via INTRAVENOUS
  Filled 2012-08-01: qty 2

## 2012-08-01 MED ORDER — METOPROLOL TARTRATE 12.5 MG HALF TABLET: 12.5000 mg | ORAL_TABLET | Freq: Two times a day (BID) | ORAL | Status: DC

## 2012-08-01 MED ORDER — ACETAMINOPHEN 325 MG PO TABS
650.0000 mg | ORAL_TABLET | Freq: Every day | ORAL | Status: DC
  Administered 2012-08-02: 650 mg via ORAL
  Filled 2012-08-01: qty 2

## 2012-08-01 MED ORDER — CALCIUM ACETATE 667 MG PO CAPS: 667.0000 mg | ORAL_CAPSULE | Freq: Three times a day (TID) | ORAL | Status: AC

## 2012-08-01 MED ORDER — PANTOPRAZOLE SODIUM 40 MG PO PACK: 40.0000 mg | PACK | Freq: Two times a day (BID) | ORAL | Status: DC

## 2012-08-01 MED ORDER — DARBEPOETIN ALFA-POLYSORBATE 40 MCG/0.4ML IJ SOLN
INTRAMUSCULAR | Status: AC
  Administered 2012-08-01: 40 ug via INTRAVENOUS
  Filled 2012-08-01: qty 0.4

## 2012-08-01 MED ORDER — NEPRO/CARBSTEADY PO LIQD: 237.0000 mL | Freq: Two times a day (BID) | ORAL | Status: AC

## 2012-08-01 MED ORDER — ONDANSETRON HCL 4 MG/2ML IJ SOLN
INTRAMUSCULAR | Status: AC
  Administered 2012-08-01: 4 mg via INTRAVENOUS
  Filled 2012-08-01: qty 2

## 2012-08-01 NOTE — Progress Notes (Addendum)
[facility] PROGRESS NOTE  Christine FIGUEREO ZOX:[id number] DOB: 10-06-1925 DOA: 07/04/2012 PCP: Judie Petit, MD  SUMMARY:  PLEASE READ CAREFULLY. Patient is an 79 year old female with history of end-stage renal disease, hypertension, hyperlipidemia.  She presented on May 27 with an apparent viral syndrome with leukocytosis and pulmonary edema on her chest x-ray. There was concern she had healthcare associated pneumonia and was started on vancomycin and cefepime.  After initiation of antibiotics she had progressive altered mental status that led to obtundation. She had an EEG which demonstrated epileptiform activity and a negative brain MRI. She was loaded with Ativan and Dilantin but was concerned she had nonconvulsive seizures so she was intubated and placed on a propofol drip.  She was extubated on June 7 diet immediately reintubated due to stridor. She was started on Decadron.  She was extubated again on June 11. She continued to have stridor after extubation and was evaluated by ENT.  Laryngoscopy on June 16 demonstrated intubation trauma with ulceration of the right arytenoid and subglottic posterior commissure. She is at risk of progression and subglottic stenosis and will need reevaluation by ENT early next week.  Her antiepileptic medications were discontinued and this is thought to be an acute event due to her cefepime.  She has remained seizure-free.  Strength is improving.   Recently she has had problems with right ear pain and is being treated for right otitis media. She's also had some left lower quadrant pain which may be due to urinary tract infection. A urine culture is pending. She needed a reevaluation by ENT because of the risk of subglottic stenosis given the degree of ulceration and inflammation seen on her previous scope.  ENT will reevaluate.  She can potentially be discharged to skilled nursing Wednesday afternoon.    Assessment/Plan  Cefepime-induced status epilepticus  requiring intubation.  Resolved  Postextubation stridor due to vocal cord trauma with eschar and ulceration, worse today and patient starting to feel like she is choking.   -  Continue protonix  -  Will call ENT for more urgent evaluation -  Flutter valve  -  Neck extension -  Consider additional dose of steroid  Dysphagia due to vocal cord injury and severe weakness, improving -  Patient and family aware of risk of aspiration with thin liquids -  Patient is at risk of dehydration because she refused thickened liquid -  Appreciate SLP and nutrition recommendations  Left ear pain.  Possibly some dullness to the eardrum, but no surrounding erythema.  The pain has not gotten better and may be getting worse.  Will treat for right acute otitis media -  Continue Auralgan and warm compresses -  Augmentin renally dosed started 6/23  Left lower quadrant pain. Occurred only during exam.  Differential diagnoses include urinary tract infection, diverticulitis, gas pains, constipation. She had a bowel movement which was normal just a few hours ago. -  CBC: White blood cell count close to normal limits -  Liver function tests and lipase: Within normal limits -  Urinalysis with 11-20 WBC and + LE. -  F/u urine cx. -  KUB:  With gas.  No evidence of constipation or perforation  Atrial fibrillation with RVR, new onset on 6/18 during dialysis. Troponin negative, TSH wnl.  CHAD2vasc score is 4, 4% annual risk of stroke.  Has bled score of 2, 1.88-4.1% annual risk of bleed.  Was started on amiodarone gtt and cardioverted.  Reverted to a-fib once more during admission, but  NSR and sinus bradycardia to the high 50s for the last two days.   - ASA 81mg  daily - Transitioned to PO amiodarone on 6/21 -  Family and patient to discuss risk/benefit of A/C  -  Consider decreasing the dose of beta blocker if this persists or if the patient becomes hypotensive or symptomatic -   discontinue telemetry   Possible TIA  on June 18 with possible transient facial droop, tongue deviation, and increased slurred speech -  MRI brain negative for stroke  Severe weakness and deconditioning improving.   -  Continue physical therapy and occupational therapy -  Continue out of bed to chair -  Continue to have patient to sit in the chair for dialysis  Severe protein calorie malnutrition with poor by mouth intake, appetite improving but states she feels full after her meals -  Liberalize diet and continue supplements -  Consider Reglan   Acute hypoxic respiratory failure possible due to prolonged intubation and muscle weakness -  Continue IS -  Wean oxygen as tolerated  Diarrhea, resolved, C. difficile negative.    HYPERTENSION, Blood pressures previously elevated but had severe hypotension during dialysis previously. -  Continue hold parameters for Norvasc and metoprolol Acute diastolic CHF (congestive heart failure), compensated  HYPERLIPIDEMIA, stable chronic  ESRD on dialysis T,H,Sa, per nephrology. -  Continue sitting in a chair for dialysis to increase strength    GOUT, asymptomatic  Muscular deconditioning, severe.  To skilled nursing.  Diet:  Dysphagia 2 with thin liquids Access:  Peripheral IV IVF:  Off Proph:  SCDs  Code Status: DO NOT RESUSCITATE Family Communication: Spoke with patient alone Disposition Plan:   Plan for skilled nursing next week. ENT reevaluation on Wed and anticipate tx to SNF Wed afternoon.   The family would like to be the ones to communicate with the patient regarding her transition to skilled nursing. They are worried that the staff would cause unnecessary anxiety for the patient by relaying information about her transfering to skilled nursing directly.  Although the patient has had delirium and is clearly not able to make decisions for her herself, the family is still concerned that we would bypass them and ask the patient to make decisions regarding her discharge to  skilled nursing.  The patient does have some times of lucidity and her mentation has been gradually improving, and I warned the son that not telling the patient about the possibility of transfer on Wednesday could cause unnecessary anxiety at the time of transfer. She may be shocked if the transport personnel arrived to move her to a different location and she had no idea that this was going to happen.  He voiced understanding and said that he would continue to communicate with his mother regarding our plans.   I reassured him that we would not be asking his mother to make any decisions regarding which skilled nursing facility to go to from here.    Consultants:  Nephrology  CCM signed off  neurohospitalist signed off  ENT [facility] Procedures:  ETT 5/29 >> 6/07  R  CVC 5/29 >> 6/12  Rt graft>>>  ETT 6/07 >> 6/11  Laryngoscopy 6/16  Antibiotics:  Vanco 5/27 >> 5/30  Cefepime 5/27 >> 5/30  Zosyn 5/28 x1 dose  Acyclovir 5/29 >> 5/31 Augmentin 6/23 >>  HPI/Subjective:  The patient states that she continues to have right ear pain which is intermittent. She received Tylenol, ear drops, and warm compresses last night and  was able to sleep. Her appetite is improving, however still poor overall. She denies diarrhea, constipation, difficult in breathing.  episode of perceived choking late this morning.  Objective: Filed Vitals:   07/31/12 2047 07/31/12 2227 08/01/12 0715 08/01/12 0724  BP: 167/41 170/55  168/43  Pulse: 65 61  61  Temp: 98.3 F (36.8 C)   97.9 F (36.6 C)  TempSrc: Oral   Oral  Resp: 16   18  Height:      Weight:   55 kg (121 lb 4.1 oz)   SpO2: 98%   98%    Intake/Output Summary (Last 24 hours) at 08/01/12 1134 Last data filed at 08/01/12 0832  Gross per 24 hour  Intake    443 ml  Output    250 ml  Net    193 ml   Filed Weights   07/30/12 0534 07/31/12 0619 08/01/12 0715  Weight: 54.2 kg (119 lb 7.8 oz) 53.842 kg (118 lb 11.2 oz) 55 kg (121 lb 4.1  oz)    Exam:   General:  Caucasian female, No acute distress  HEENT:  NCAT, MMM.  TM on right dull without surrounding erythema. Stable from prior  Cardiovascular:  RRR, nl S1, S2 no mrg, 2+ pulses, warm extremities  Respiratory:  High pitched particularly with deep breaths, no rales or rhonchi or wheeze.  No increased WOB  Abdomen:  NABS, soft,  nondistended, focal tenderness to palpation in the left lower quadrant without rebound or guarding, stable from yesterday.   MSK:  Normal tone and bulk, no LEE  Neuro:  Able to lift her arms and lower legs off the bed.  Grip 4/5.  No facial droop.  Her voice continues to improve.  Data Reviewed: Basic Metabolic Panel:  Recent Labs Lab 07/26/12 0700 07/28/12 1307 07/29/12 0855 07/31/12 1711 08/01/12 0457  NA 129* 133* 136 126* 126*  K 5.1 4.6 3.3* 4.9 4.8  CL 89* 94* 97 88* 86*  CO2 20 22 27 22 21   GLUCOSE 105* 116* 117* 91 87  BUN 116* 61* 20 47* 56*  CREATININE 6.92* 5.65* 2.97* 4.74* 5.34*  CALCIUM 7.8* 7.9* 8.2* 8.4 8.4  MG 2.0  --   --   --   --   PHOS 6.9* 6.0*  --   --  4.9*   Liver Function Tests:  Recent Labs Lab 07/26/12 0700 07/28/12 1307 07/31/12 1711 08/01/12 0457  AST  --   --  21  --   ALT  --   --  19  --   ALKPHOS  --   --  95  --   BILITOT  --   --  0.1*  --   PROT  --   --  6.2  --   ALBUMIN 2.8* 2.4* 2.3* 2.3*    Recent Labs Lab 07/31/12 1711  LIPASE 66*   No results found for this basename: AMMONIA,  in the last 168 hours CBC:  Recent Labs Lab 07/26/12 0700 07/28/12 1307 07/29/12 0855 07/31/12 1711 08/01/12 0457  WBC 16.4* 16.3* 8.7 10.3 9.5  HGB 11.1* 10.8* 10.7* 11.1* 10.2*  HCT 32.9* 32.9* 33.2* 33.4* 30.6*  MCV 96.5 97.9 99.1 96.3 96.2  PLT 350 317 281 303 281   Cardiac Enzymes:  Recent Labs Lab 07/26/12 0801  TROPONINI <0.30   BNP (last 3 results)  Recent Labs  07/04/12 0724  PROBNP 45870.0*   CBG: No results found for this basename: GLUCAP,  in the last  168  hours  No results found for this or any previous visit (from the past 240 hour(s)).   Studies: Dg Abd 1 View  07/31/2012   *RADIOLOGY REPORT*  Clinical Data: Left lower quadrant abdominal pain  ABDOMEN - 1 VIEW  Comparison: Abdomen film of 07/15/2012  Findings: Supine views of the abdomen show no bowel obstruction. There is very mild gaseous distention of the stomach.  There are diffuse degenerative changes throughout the lumbar spine.  Right total hip replacement is noted.  IMPRESSION: Nonspecific bowel gas pattern.  Diffuse degenerative change throughout the lumbar spine   Original Report Authenticated By: Dwyane Dee, M.D.    Scheduled Meds: . amiodarone  200 mg Oral Daily  . amLODipine  10 mg Oral Daily  . amoxicillin-clavulanate  1 tablet Oral q1800  . aspirin EC  81 mg Oral Daily  . calcium acetate  667 mg Oral TID WC  . darbepoetin (ARANESP) injection - DIALYSIS  40 mcg Intravenous Q Tue-HD  . doxercalciferol  2 mcg Intravenous Q T,Th,Sa-HD  . feeding supplement (NEPRO CARB STEADY)  237 mL Oral BID BM  . feeding supplement  30 mL Oral BID  . metoprolol tartrate  12.5 mg Oral BID  . multivitamin  1 tablet Oral QHS  . pantoprazole sodium  40 mg Per Tube BID  . sodium chloride  3 mL Intravenous Q12H   Continuous Infusions: . sodium chloride 10 mL/hr (07/26/12 1635)    Active Problems:   HYPERLIPIDEMIA   GOUT   HYPERTENSION   ESRD on dialysis   Acute encephalopathy   Acute diastolic CHF (congestive heart failure)   Acute respiratory failure   Status epilepticus   Muscular deconditioning   Postextubation stridor   Diarrhea   Other dysphagia   Trauma to vocal cord   Atrial fibrillation   Otitis media of right ear   Abdominal pain, left lower quadrant    Time spent: 30 min    Damien Batty, [facility]  [facility] Pager [phone]. If 7PM-7AM, please contact night-coverage at [email or web address], password [facility] 08/01/2012, 11:34 AM  LOS: 28 days

## 2012-08-01 NOTE — Progress Notes (Signed)
08/01/2012 12:50 PM  Christine Graham [id number]  8 day recheck.  Improving on multiple fronts.  Complaining last 1-2 days of increasing dyspnea and inability to successfully clear her throat.   No pain.  No bleeding.  On po Protonix.  NG placement failed in Radiology.  Pt. On oral diet with reasonably good tolerance and no gross suggestion of aspiration.  ENT called for re-evaluation for subjective dyspnea.  O2 sats remain good.      Temp:  [97.9 F (36.6 C)-98.3 F (36.8 C)] 97.9 F (36.6 C) (06/24 0724) Pulse Rate:  [58-65] 61 (06/24 0724) Resp:  [16-18] 18 (06/24 0724) BP: (159-170)/(41-55) 168/43 mmHg (06/24 0724) SpO2:  [98 %] 98 % (06/24 0724) Weight:  [55 kg (121 lb 4.1 oz)] 55 kg (121 lb 4.1 oz) (06/24 0715),     Intake/Output Summary (Last 24 hours) at 08/01/12 1250 Last data filed at 08/01/12 4098  Gross per 24 hour  Intake    443 ml  Output    100 ml  Net    343 ml    Results for orders placed during the hospital encounter of 07/04/12 (from the past 24 hour(s))  CBC     Status: Abnormal   Collection Time    07/31/12  5:11 PM      Result Value Range   WBC 10.3  4.0 - 10.5 K/uL   RBC 3.47 (*) 3.87 - 5.11 MIL/uL   Hemoglobin 11.1 (*) 12.0 - 15.0 g/dL   HCT 11.9 (*) 14.7 - 82.9 %   MCV 96.3  78.0 - 100.0 fL   MCH 32.0  26.0 - 34.0 pg   MCHC 33.2  30.0 - 36.0 g/dL   RDW 56.2 (*) 13.0 - 86.5 %   Platelets 303  150 - 400 K/uL  COMPREHENSIVE METABOLIC PANEL     Status: Abnormal   Collection Time    07/31/12  5:11 PM      Result Value Range   Sodium 126 (*) 135 - 145 mEq/L   Potassium 4.9  3.5 - 5.1 mEq/L   Chloride 88 (*) 96 - 112 mEq/L   CO2 22  19 - 32 mEq/L   Glucose, Bld 91  70 - 99 mg/dL   BUN 47 (*) 6 - 23 mg/dL   Creatinine, Ser 7.84 (*) 0.50 - 1.10 mg/dL   Calcium 8.4  8.4 - 69.6 mg/dL   Total Protein 6.2  6.0 - 8.3 g/dL   Albumin 2.3 (*) 3.5 - 5.2 g/dL   AST 21  0 - 37 U/L   ALT 19  0 - 35 U/L   Alkaline Phosphatase 95  39 - 117 U/L   Total  Bilirubin 0.1 (*) 0.3 - 1.2 mg/dL   GFR calc non Af Amer 8 (*) >90 mL/min   GFR calc Af Amer 9 (*) >90 mL/min  LIPASE, BLOOD     Status: Abnormal   Collection Time    07/31/12  5:11 PM      Result Value Range   Lipase 66 (*) 11 - 59 U/L  URINALYSIS, ROUTINE W REFLEX MICROSCOPIC     Status: Abnormal   Collection Time    07/31/12  9:07 PM      Result Value Range   Color, Urine YELLOW  YELLOW   APPearance CLEAR  CLEAR   Specific Gravity, Urine 1.020  1.005 - 1.030   pH 8.0  5.0 - 8.0   Glucose, UA NEGATIVE  NEGATIVE mg/dL  Hgb urine dipstick SMALL (*) NEGATIVE   Bilirubin Urine NEGATIVE  NEGATIVE   Ketones, ur NEGATIVE  NEGATIVE mg/dL   Protein, ur >956 (*) NEGATIVE mg/dL   Urobilinogen, UA 0.2  0.0 - 1.0 mg/dL   Nitrite NEGATIVE  NEGATIVE   Leukocytes, UA MODERATE (*) NEGATIVE  URINE MICROSCOPIC-ADD ON     Status: Abnormal   Collection Time    07/31/12  9:07 PM      Result Value Range   Squamous Epithelial / LPF MANY (*) RARE   WBC, UA 21-50  <3 WBC/hpf   RBC / HPF 0-2  <3 RBC/hpf   Bacteria, UA FEW (*) RARE  RENAL FUNCTION PANEL     Status: Abnormal   Collection Time    08/01/12  4:57 AM      Result Value Range   Sodium 126 (*) 135 - 145 mEq/L   Potassium 4.8  3.5 - 5.1 mEq/L   Chloride 86 (*) 96 - 112 mEq/L   CO2 21  19 - 32 mEq/L   Glucose, Bld 87  70 - 99 mg/dL   BUN 56 (*) 6 - 23 mg/dL   Creatinine, Ser 2.13 (*) 0.50 - 1.10 mg/dL   Calcium 8.4  8.4 - 08.6 mg/dL   Phosphorus 4.9 (*) 2.3 - 4.6 mg/dL   Albumin 2.3 (*) 3.5 - 5.2 g/dL   GFR calc non Af Amer 7 (*) >90 mL/min   GFR calc Af Amer 8 (*) >90 mL/min  CBC     Status: Abnormal   Collection Time    08/01/12  4:57 AM      Result Value Range   WBC 9.5  4.0 - 10.5 K/uL   RBC 3.18 (*) 3.87 - 5.11 MIL/uL   Hemoglobin 10.2 (*) 12.0 - 15.0 g/dL   HCT 57.8 (*) 46.9 - 62.9 %   MCV 96.2  78.0 - 100.0 fL   MCH 32.1  26.0 - 34.0 pg   MCHC 33.3  30.0 - 36.0 g/dL   RDW 52.8 (*) 41.3 - 24.4 %   Platelets 281  150 -  400 K/uL    SUBJECTIVE:  Feels some work of respiration.  Feels unable to clear phlegm from throat.    OBJECTIVE:  More alert and energetic.  Voice very soft, but at least somewhat phonatory.    Stridor with inspiration, moreso with vigorous inspiration.  No retractions or obvious labor.  Cough not fully functional, with audible residual secretions.    With the flexible laryngoscope, using 2% viscous xylocaine for topical anesthesia, both vocal cords are more mobile and the arytenoid and vocal cord ulcerations are healing.  There is a semi-loose blob of mucus/eschar in the posterior sub glottis which she cannot cough up.  No sign of stenosis/  Airway is good.   IMPRESSION:  Improved with healing ulcerations and adequate airway.  New symptoms related to loosening of eschar which I expect her to expel successfully soon.  PLAN:  Airway should be safe.  Recheck my office 2 weeks.  Continue PPI's.  Flo Shanks

## 2012-08-01 NOTE — Progress Notes (Signed)
PT progress note:      08/01/12 1444  PT Visit Information  Last PT Received On 08/01/12  Assistance Needed +2  PT Time Calculation  PT Start Time 1301  PT Stop Time 1324  PT Time Calculation (min) 23 min  Precautions  Precautions Fall  Precaution Comments dysphasia diet with nectar thick liquids.  Restrictions  Weight Bearing Restrictions No  Cognition  Arousal/Alertness Awake/alert  Overall Cognitive Status Impaired/Different from baseline  Area of Impairment Memory;Attention  Current Attention Level Focused  Memory Decreased short-term memory  Following Commands Follows one step commands with increased time  Bed Mobility  Bed Mobility Supine to Sit;Sitting - Scoot to Edge of Bed  Supine to Sit 1: +2 Total assist  Supine to Sit: Patient Percentage 20%  Sitting - Scoot to Edge of Bed 1: +1 Total assist  Sitting - Scoot to Edge of Bed: Patient Percentage 0%  Details for Bed Mobility Assistance (A) for all components of transitional movement.    Transfers  Transfers Sit to Stand;Stand to Sit  Sit to Stand 1: +2 Total assist  Sit to Stand: Patient Percentage 10%  Stand to Sit 1: +2 Total assist;With upper extremity assist;To chair/3-in-1  Stand to Sit: Patient Percentage 10%  Stand Pivot Transfers 1: +2 Total assist  Stand Pivot Transfers: Patient Percentage 20%  Details for Transfer Assistance Max directional cues.  Pt unable to stand upright but she was able to take 2 small shuffling steps around to chair with therapists guiding/rotating hips from bed>recliner.    Ambulation/Gait  Ambulation/Gait Assistance Not tested (comment)  Balance  Balance Assessed Yes  Static Sitting Balance  Static Sitting - Balance Support Bilateral upper extremity supported;Feet supported  Static Sitting - Comment/# of Minutes Assistance needed fluctuated between SBA to Max assist at times.  Pt with LOB backwards & to Rt.  She would only self correct if she was given cues to do so.  When asked  if she felt off balance she stated "yes" but she states she was losing balance forwards.    Dynamic Sitting Balance  Dynamic Sitting - Balance Support Feet supported;Left upper extremity supported;Right upper extremity supported  Dynamic Sitting - Level of Assistance 5: Stand by assistance;2: Max assist  Dynamic Sitting - Comments Had pt perform reaching forwards with Rt hand to therapists' hand as well as having her place bil UE's on therapists' shoulders & working on anterior<>posterior translation.    PT - End of Session  Activity Tolerance Patient limited by fatigue  Patient left in chair;with call bell/phone within reach  Nurse Communication Mobility status  PT - Assessment/Plan  Comments on Treatment Session Pt making slow progress with mobility.  Cont's to require significant assistance for all mobility at this date.    PT Plan Discharge plan remains appropriate;Frequency remains appropriate  PT Frequency Min 3X/week  Follow Up Recommendations SNF  PT equipment None recommended by PT  Acute Rehab PT Goals  Time For Goal Achievement 08/06/2012  Potential to Achieve Goals Good  Pt will go Supine/Side to Sit with min assist  PT Goal: Supine/Side to Sit - Progress Not progressing  Pt will go Sit to Supine/Side with min assist  Pt will Transfer Bed to Chair/Chair to Bed with min assist  PT Transfer Goal: Bed to Chair/Chair to Bed - Progress Not progressing  Pt will Stand with min assist;1 - 2 min;with bilateral upper extremity support  Pt will Ambulate 16 - 50 feet;with min assist;with rolling walker  Pt will Go Up / Down Stairs 1-2 stairs;with supervision;with least restrictive assistive device  PT General Charges  $$ ACUTE PT VISIT 1 Procedure  PT Treatments  $Therapeutic Activity 23-37 mins     Verdell Face, [place] [phone] 08/01/2012

## 2012-08-01 NOTE — Progress Notes (Signed)
Occupational Therapy Treatment Patient Details Name: Christine Graham MRN: [id number] DOB: 1925/07/23 Today's Date: 08/01/2012 Time: 3474-2595 OT Time Calculation (min): 26 min  OT Assessment / Plan / Recommendation Comments on Treatment Session Pt very limited by fatigue.  Pt resistive to getting up in the chair but did ultimately agree although did not assist much with the process. Pt requires more and more encouragment to participate.    Follow Up Recommendations  SNF    Barriers to Discharge   Will not have adequate care/assist level at home at this time    Equipment Recommendations    TBD   Recommendations for Other Services    Frequency Min 2X/week   Plan Discharge plan remains appropriate    Precautions / Restrictions Precautions Precautions: Fall Precaution Comments: dysphasia diet with nectar thick liquids. Restrictions Weight Bearing Restrictions: No   Pertinent Vitals/Pain     ADL  Grooming: Wash/dry hands;Wash/dry face;Minimal assistance Where Assessed - Grooming: Supported sitting Upper Body Bathing: Simulated;Moderate assistance Where Assessed - Upper Body Bathing: Supported sitting Upper Body Dressing: +1 Total assistance Transfers/Ambulation Related to ADLs: Pt unable to hold self up on side of bed.  Required +2 assist for all mobility and for balance/support during grooming and UB bathing tasks (simulated) ADL Comments: Balance/support at EOB fluctuated from min guard A - mod/max A during functional tasks    OT Diagnosis:    OT Problem List:   OT Treatment Interventions:     OT Goals ADL Goals ADL Goal: Grooming - Progress: Progressing toward goals ADL Goal: Upper Body Bathing - Progress: Not progressing ADL Goal: Lower Body Bathing - Progress: Not progressing ADL Goal: Toilet Transfer - Progress: Not progressing Arm Goals Arm Goal: AROM - Progress: Not progressing Miscellaneous OT Goals OT Goal: Miscellaneous Goal #1 - Progress: Not progressing OT  Goal: Miscellaneous Goal #2 - Progress: Not progressing  Visit Information  Last OT Received On: 08/01/12 Assistance Needed: +2    Subjective Data  Subjective: " I don't feel too good today " Patient Stated Goal: to go home   Prior Functioning       Cognition  Cognition Arousal/Alertness: Awake/alert Overall Cognitive Status: Impaired/Different from baseline Area of Impairment: Memory;Attention Current Attention Level: Focused Memory: Decreased short-term memory Following Commands: Follows one step commands with increased time    Mobility  Bed Mobility Bed Mobility: Supine to Sit;Sitting - Scoot to Edge of Bed Supine to Sit: 1: +2 Total assist;With rails;HOB elevated Sitting - Scoot to Edge of Bed: 1: +2 Total assist Transfers Transfers: Sit to Stand;Stand to Sit Sit to Stand: 1: +2 Total assist;With upper extremity assist;From bed Sit to Stand: Patient Percentage: 20% Stand to Sit: 1: +2 Total assist;With upper extremity assist;To chair/3-in-1 Stand to Sit: Patient Percentage: 20%    Exercises      Balance Balance Balance Assessed: Yes Static Sitting Balance Static Sitting - Balance Support: Bilateral upper extremity supported;Feet supported Static Sitting - Comment/# of Minutes: min guard A Dynamic Sitting Balance Dynamic Sitting - Balance Support: Bilateral upper extremity supported;During functional activity Dynamic Sitting - Level of Assistance: 2: Max assist;3: Mod assist   End of Session OT - End of Session Activity Tolerance: Patient limited by fatigue Patient left: in chair;with call bell/phone within reach  GO     Galen Manila 08/01/2012, 2:48 PM

## 2012-08-01 NOTE — Progress Notes (Signed)
Palliative Care Team at [facility] Progress Note   Patient unchanged. Appears less motivated an more depressed-this has been a very long hospitalization for her. She is still confused, but also has periods where she is very clear. Her insight into her condition is poor, she focuses on going home.   Son is HCPOA and has explicitly requested that issues regarding SNF not be discussed with patient unless he is present.  OBJECTIVE: Vital Signs: BP 151/30  Pulse 74  Temp(Src) 98.5 F (36.9 C) (Oral)  Resp 16  Ht 5\' 5"  (1.651 m)  Wt 57.9 kg (127 lb 10.3 oz)  BMI 21.24 kg/m2  SpO2 100%   Intake and Output: 06/23 0701 - 06/24 0700 In: 483 [P.O.:480; I.V.:3] Out: 250 [Urine:250]  Physical Exam: General: Pale, weak appearing, no distress  Head: Normocephalic, atraumatic.  Lungs:  Poor respiratory effort  Heart: IRIR. without gallop,  or rubs. (+) murmur  Abdomen:  BS normoactive. Soft, Nondistended, non-tender.  No masses or organomegaly.  Extremities: + edema.    Allergies  Allergen Reactions  . Cefepime Other (See Comments)    encephalopathy  . Codeine Nausea Only  . Oxycodone Nausea Only    Medications: Scheduled Meds:  . acetaminophen  650 mg Oral QHS  . amiodarone  200 mg Oral Daily  . amLODipine  10 mg Oral Daily  . amoxicillin-clavulanate  1 tablet Oral q1800  . aspirin EC  81 mg Oral Daily  . calcium acetate  667 mg Oral TID WC  . darbepoetin (ARANESP) injection - DIALYSIS  40 mcg Intravenous Q Tue-HD  . doxercalciferol  2 mcg Intravenous Q T,Th,Sa-HD  . feeding supplement (NEPRO CARB STEADY)  237 mL Oral BID BM  . feeding supplement  30 mL Oral BID  . metoprolol tartrate  12.5 mg Oral BID  . multivitamin  1 tablet Oral QHS  . pantoprazole sodium  40 mg Per Tube BID  . sodium chloride  3 mL Intravenous Q12H    Continuous Infusions: . sodium chloride 10 mL/hr (07/26/12 1635)    PRN Meds: sodium chloride, sodium chloride, acetaminophen (TYLENOL) oral  liquid 160 mg/5 mL, ALPRAZolam, antipyrine-benzocaine, antiseptic oral rinse, feeding supplement (NEPRO CARB STEADY), heparin, heparin, labetalol, lidocaine (PF), lidocaine-prilocaine, ondansetron (ZOFRAN) IV, pentafluoroprop-tetrafluoroeth, phenol, RESOURCE THICKENUP CLEAR  Stool Softner: yes   Palliative Performance Scale: 30%    Labs: CBC    Component Value Date/Time   WBC 9.5 08/01/2012 0457   RBC 3.18* 08/01/2012 0457   HGB 10.2* 08/01/2012 0457   HCT 30.6* 08/01/2012 0457   PLT 281 08/01/2012 0457   MCV 96.2 08/01/2012 0457   MCH 32.1 08/01/2012 0457   MCHC 33.3 08/01/2012 0457   RDW 17.2* 08/01/2012 0457   LYMPHSABS 2.1 07/14/2012 0350   MONOABS 1.4* 07/14/2012 0350   EOSABS 0.3 07/14/2012 0350   BASOSABS 0.1 07/14/2012 0350    CMET     Component Value Date/Time   NA 126* 08/01/2012 0457   K 4.8 08/01/2012 0457   CL 86* 08/01/2012 0457   CO2 21 08/01/2012 0457   GLUCOSE 87 08/01/2012 0457   GLUCOSE 98 12/20/2005 1108   BUN 56* 08/01/2012 0457   CREATININE 5.34* 08/01/2012 0457   CALCIUM 8.4 08/01/2012 0457   CALCIUM 9.0 07/10/2010 1125   PROT 6.2 07/31/2012 1711   ALBUMIN 2.3* 08/01/2012 0457   AST 21 07/31/2012 1711   ALT 19 07/31/2012 1711   ALKPHOS 95 07/31/2012 1711   BILITOT 0.1* 07/31/2012 1711  GFRNONAA 7* 08/01/2012 0457   GFRAA 8* 08/01/2012 0457     ASSESSMENT/ PLAN: Multiple end-stage medical problems-Prolonged hospitalization with multiple complications. Patient now stable and issues now are related to care coordination and disposition.  I spoke with son at length on phone and he is disappointed that the 5-6 choices for SNF didn't work out today in terms of bed availability. He is looking at additional facilities. He also reports that he has been preparing his mother that she will be moving to rehab soon.   I further explained that our team would continue to follow if needed to make sure Ms. Rennaker has a smooth transition and that we could recommend PCS service to follow at  SNF.  Scheduled Tylenol at bedtime per patient's request.  Time 15 minutes  Greater than 50%  of this time was spent counseling and coordinating care related to the above assessment and plan.   Edsel Petrin, DO  08/01/2012, 11:24 PM  Please contact Palliative Medicine Team phone at [phone] for questions and concerns.

## 2012-08-01 NOTE — Discharge Summary (Signed)
Physician Discharge Summary  Christine Graham ZOX:[id number] DOB: [birth year] DOA: 07/04/2012  PCP: Judie Petit, MD  Admit date: 07/04/2012 Discharge date: 08/01/2012  Recommendations for Outpatient Follow-up:  1. To skilled nursing for ongoing physical and occupational therapy 2. Continue dialysis on Tuesday, Thursday, Saturday schedule 3. Dr. Lazarus Salines, ENT, in 2 weeks for followup of airway ulcerations and trauma 4. [facility] in 1 month for followup new onset atrial fibrillation with RVR. 5. Continue Augmentin and auralgan for total of 7 days, last day on June 29th.  Discharge Diagnoses:  Active Problems:   HYPERLIPIDEMIA   GOUT   HYPERTENSION   ESRD on dialysis   Acute encephalopathy   Acute diastolic CHF (congestive heart failure)   Acute respiratory failure   Status epilepticus   Muscular deconditioning   Postextubation stridor   Diarrhea   Other dysphagia   Trauma to vocal cord   Atrial fibrillation   Otitis media of right ear   Abdominal pain, left lower quadrant   Discharge Condition: fair  Diet recommendation: Dysphagia 2 with thin liquids  Wt Readings from Last 3 Encounters:  08/01/12 57.9 kg (127 lb 10.3 oz)  06/30/12 59.421 kg (131 lb)  05/17/12 60.328 kg (133 lb)    History of present illness:  Christine Graham is a 79 y.o. female with pmh of ESRD, HTN, HLD, AOCD; came to ED complaining of increased SOB and non productive cough. Patient reprots symptoms has been present for the last 1-2 weeks and initially associated with nausea/vomiting and loose stools; now no further nausea or vomiting, but still with loose stool. She is also complaining of general malaise, weakness and increase SOB and cough. Patient denies CP, fever, chills, dysuria, melena, hematochezia and hematemesis. In ED CXR demonstrating bilateral infiltrates (concers for PNA vs edema); BNP is elevated and had WBC's in the 14,000 range.  Hospital Course:   SUMMARY:  Patient is an  79 year old female with history of end-stage renal disease, hypertension, hyperlipidemia. She presented on May 27 with an apparent viral syndrome with leukocytosis and pulmonary edema on her chest x-ray. There was concern she had healthcare associated pneumonia and was started on vancomycin and cefepime. After initiation of antibiotics she had progressive altered mental status that led to obtundation. She had an EEG which demonstrated epileptiform activity and a negative brain MRI. She was loaded with Ativan and Dilantin but continued to have nonconvulsive seizures so she was intubated and placed on a propofol drip. She was extubated on June 7 but immediately reintubated due to stridor. She was started on Decadron. She was extubated again on June 11. She continued to have stridor after extubation and was evaluated by ENT. Laryngoscopy on June 16 demonstrated intubation trauma with ulceration of the right arytenoid and subglottic posterior commissure. She is at risk of progression and subglottic stenosis.  Her antiepileptic medications were discontinued as this was thought to be an acute event due to a rare toxicity of cefepime. She has remained seizure-free.  She was initially so weak she can barely lift her finger off the bed, however now she is able to sit in the chair for dialysis.  She developed atrial fibrillation with RVR on June 18 with hypotension. She cardioverted with amiodarone and [facility] was consulted.  She should continue amiodarone and followup with him in clinic in a few weeks. She will continue aspirin 81 mg.  I discussed risks and benefits of anticoagulation with the family and they declined.  Recently she has had  problems with right ear pain and is being treated for right otitis media. She's also had some left lower quadrant pain which may be due to urinary tract infection. A urine culture is pending.  By systems:  Cefepime-induced status epilepticus requiring intubation. Resolved   Postextubation stridor due to vocal cord trauma with eschar and ulceration - Reevaluated by ENT on 6/24 and appears to be improving - Continue protonix  - Flutter valve   Dysphagia due to vocal cord injury and severe weakness, improving  - Patient and family aware of risk of aspiration with thin liquids  - Patient is at risk of dehydration because she refused thickened liquid  - Continue dysphagia 2 diet with thin liquids  Left ear pain. Possibly some dullness to the eardrum, but no surrounding erythema. The pain has not gotten better and may be getting worse so started treatment for right acute otitis media  - Continue Auralgan and warm compresses  - Augmentin renally dosed started 6/23   Left lower quadrant pain. Occurred only during exam. Differential diagnoses include urinary tract infection, diverticulitis, gas pains, constipation. She had a bowel movement which was normal just a few hours ago.  - CBC: White blood cell count close to normal limits  - Liver function tests and lipase: Within normal limits  - Urinalysis with 11-20 WBC and + LE.  - F/u urine cx.  - KUB: With gas. No evidence of constipation or perforation   Atrial fibrillation with RVR, new onset on 6/18 during dialysis. Troponin negative, TSH wnl. CHAD2vasc score is 4, 4% annual risk of stroke. Has bled score of 2, 1.88-4.1% annual risk of bleed. Was started on amiodarone gtt and cardioverted. Reverted to a-fib once more during admission, but NSR and sinus bradycardia to the high 50s for the last two days.  - ASA 81mg  daily  - Transitioned to PO amiodarone on 6/21  - Family and patient to discuss risk/benefit of A/C  - Consider decreasing the dose of beta blocker if this persists or if the patient becomes hypotensive or symptomatic   Possible TIA on June 18 with possible transient facial droop, tongue deviation, and increased slurred speech  - MRI brain negative for stroke   Severe weakness and deconditioning  improving.  - Continue physical therapy and occupational therapy   Severe protein calorie malnutrition with poor by mouth intake, appetite improving but states she feels full after her meals  - Liberalize diet and continue supplements   Acute hypoxic respiratory failure possible due to prolonged intubation and muscle weakness. - Continue IS  - Wean oxygen as tolerated   Diarrhea, resolved, C. difficile negative.   HYPERTENSION, Blood pressures previously elevated but had severe hypotension during dialysis previously.  - Continue hold parameters for Norvasc and metoprolol   Acute diastolic CHF (congestive heart failure), compensated  HYPERLIPIDEMIA, stable chronic.  Okay to restart statin at discharge. ESRD on dialysis T,H,Sa, per nephrology.  GOUT, asymptomatic  Muscular deconditioning, severe. To skilled nursing.   Consultants:  Nephrology  CCM signed off  neurohospitalist signed off  [facility]  Cardiology Procedures:  ETT 5/29 >> 6/07  R  CVC 5/29 >> 6/12  Rt graft>>>  ETT 6/07 >> 6/11  Laryngoscopy 6/16  Antibiotics:  Vanco 5/27 >> 5/30  Cefepime 5/27 >> 5/30  Zosyn 5/28 x1 dose  Acyclovir 5/29 >> 5/31  Augmentin 6/23 >>   Discharge Exam: Filed Vitals:   08/01/12 2015  BP: 166/57  Pulse: 68  Temp:  Resp: 16   Filed Vitals:   08/01/12 1521 08/01/12 1921 08/01/12 1945 08/01/12 2015  BP: 167/55 181/71 178/65 166/57  Pulse: 60 65 65 68  Temp: 98 F (36.7 C) 98.5 F (36.9 C)    TempSrc: Oral Oral    Resp: 18 16 16 16   Height:      Weight:  57.9 kg (127 lb 10.3 oz)    SpO2: 99% 100%      General: Caucasian female, No acute distress  HEENT: NCAT, MMM. TM on right dull without surrounding erythema. Stable from prior  Cardiovascular: RRR, nl S1, S2 no mrg, 2+ pulses, warm extremities  Respiratory: High pitched particularly with deep breaths, no rales or rhonchi or wheeze. No increased WOB  Abdomen: NABS, soft, nondistended, focal tenderness to  palpation in the left lower quadrant without rebound or guarding, stable from yesterday.  MSK: Normal tone and bulk, no LEE  Neuro: Able to lift her arms and lower legs off the bed. Grip 4/5. No facial droop. Her voice continues to improve.   Discharge Instructions      Discharge Orders   Future Orders Complete By Expires     Call MD for:  difficulty breathing, headache or visual disturbances  As directed     Call MD for:  extreme fatigue  As directed     Call MD for:  hives  As directed     Call MD for:  persistant dizziness or light-headedness  As directed     Call MD for:  persistant nausea and vomiting  As directed     Call MD for:  severe uncontrolled pain  As directed     Call MD for:  temperature >100.4  As directed     Diet general  As directed     Comments:      Dysphagia 2 with thin liquids    Increase activity slowly  As directed         Medication List    STOP taking these medications       amoxicillin 500 MG capsule  Commonly known as:  AMOXIL     atenolol 50 MG tablet  Commonly known as:  TENORMIN      TAKE these medications       acetaminophen 325 MG tablet  Commonly known as:  TYLENOL  Take 650 mg by mouth every 6 (six) hours as needed. For pain/fever     ALPRAZolam 0.25 MG tablet  Commonly known as:  XANAX  Take 1 tablet (0.25 mg total) by mouth 2 (two) times daily as needed. For anxiety     amiodarone 200 MG tablet  Commonly known as:  PACERONE  Take 1 tablet (200 mg total) by mouth daily.     amLODipine 10 MG tablet  Commonly known as:  NORVASC  Take 1 tablet (10 mg total) by mouth daily.     amoxicillin-clavulanate 500-125 MG per tablet  Commonly known as:  AUGMENTIN  Take 1 tablet (500 mg total) by mouth daily at 6 PM.     antipyrine-benzocaine otic solution  Commonly known as:  AURALGAN  Place 3-4 drops into the right ear every 2 (two) hours as needed for pain.     aspirin EC 81 MG tablet  Take 81 mg by mouth daily.     calcium  acetate 667 MG capsule  Commonly known as:  PHOSLO  Take 1 capsule (667 mg total) by mouth 3 (three) times daily with meals.  feeding supplement (NEPRO CARB STEADY) Liqd  Take 237 mLs by mouth 2 (two) times daily between meals.     feeding supplement Liqd  Take 30 mLs by mouth 2 (two) times daily.     metoprolol tartrate 12.5 mg Tabs  Commonly known as:  LOPRESSOR  Take 0.5 tablets (12.5 mg total) by mouth 2 (two) times daily.     multivitamin with minerals Tabs  Take 1 tablet by mouth daily.     pantoprazole sodium 40 mg/20 mL Pack  Commonly known as:  PROTONIX  Take 20 mLs (40 mg total) by mouth 2 (two) times daily.     phenol 1.4 % Liqd  Commonly known as:  CHLORASEPTIC  Use as directed 1 spray in the mouth or throat as needed.     rosuvastatin 20 MG tablet  Commonly known as:  CRESTOR  Take 20 mg by mouth daily.       Follow-up Information   Follow up with Judie Petit, MD. Schedule an appointment as soon as possible for a visit in 2 weeks.   Contact information:   [address] [place] [address] [phone]       Follow up with [facility]). Schedule an appointment as soon as possible for a visit in 1 month.   Contact information:   [address] [place] [address] [phone]      Follow up with Flo Shanks, MD. Schedule an appointment as soon as possible for a visit in 2 weeks.   Contact information:   [address] [place] [address] [phone]       Follow up with Hemodialysis.   Contact information:   Tuesday, Thursday, Saturday       The results of significant diagnostics from this hospitalization (including imaging, microbiology, ancillary and laboratory) are listed below for reference.    Significant Diagnostic Studies: Dg Chest 2 View  07/05/2012   *RADIOLOGY REPORT*  Clinical Data: Follow up shortness of breath  CHEST - 2 VIEW  Comparison:  07/04/2012  Findings: Normal heart size. There are low lung volumes.  Bilateral pleural effusions are identified and appear similar to previous exam.  There is moderate interstitial edema.  Review of the visualized osseous structures is significant for thoracic spondylosis.  IMPRESSION:  1.  Moderate CHF.  This is not significantly improved from previous exam.   Original Report Authenticated By: Signa Kell, M.D.   Dg Chest 2 View  07/04/2012   *RADIOLOGY REPORT*  Clinical Data: Bilateral airspace disease versus edema check for improvement following dialysis  CHEST - 2 VIEW  Comparison: 07/04/2012 07:43 hours  Findings: Cardiac shadow is stable.  There is some left basilar infiltrate stable in appearance.  There has been some improvement in the degree of central vascular congestion when compared with the prior exam.  IMPRESSION: Improved appearance consistent with edema improvement following dialysis. There are persistent bibasilar changes worst on the left.   Original Report Authenticated By: Alcide Clever, M.D.   Dg Chest 2 View  07/04/2012   *RADIOLOGY REPORT*  Clinical Data: Shortness of breath and cough.  CHEST - 2 VIEW  Comparison: PA and lateral chest 07/05/2011.  Findings: The patient has small bilateral pleural effusions, larger on the left.  Bilateral airspace disease also appears worse on the left.  Heart size is upper normal.  No pneumothorax.  IMPRESSION: Bilateral airspace disease and small effusions, worse on the left, are likely due  to asymmetric edema rather than pneumonia.   Original Report Authenticated By: Holley Dexter, M.D.   Dg Abd 1 View  07/31/2012   *RADIOLOGY REPORT*  Clinical Data: Left lower quadrant abdominal pain  ABDOMEN - 1 VIEW  Comparison: Abdomen film of 07/15/2012  Findings: Supine views of the abdomen show no bowel obstruction. There is very mild gaseous distention of the stomach.  There are diffuse degenerative changes throughout the lumbar spine.  Right total hip  replacement is noted.  IMPRESSION: Nonspecific bowel gas pattern.  Diffuse degenerative change throughout the lumbar spine   Original Report Authenticated By: Dwyane Dee, M.D.   Ct Head Wo Contrast  07/08/2012   *RADIOLOGY REPORT*  Clinical Data: Seizure-like activity.  Possible encephalopathy. The patient is not aroused a voice.,.  CT HEAD WITHOUT CONTRAST  Technique:  Contiguous axial images were obtained from the base of the skull through the vertex without contrast.  Comparison: MRI of the head without contrast 07/05/2012.  Findings: A remote right inferior cerebellar infarct is again noted.  Atrophy and moderate to periventricular white matter disease is evident bilaterally.  No acute cortical infarct, hemorrhage, or mass lesion is present.  There is no significant interval change.  The paranasal sinuses and mastoid air cells are clear.  The osseous skull is intact.  Note is made of hyperostosis frontalis internus.  IMPRESSION:  1.  Stable atrophy and diffuse nonspecific white matter disease. This could be related to a diffuse encephalopathy.  It is not significantly changed since 2009. 2.  Remote right PICA territory infarct.   Original Report Authenticated By: Marin Roberts, M.D.   Ct Head Wo Contrast  07/05/2012   *RADIOLOGY REPORT*  Clinical Data: Altered mental status  CT HEAD WITHOUT CONTRAST  Technique:  Contiguous axial images were obtained from the base of the skull through the vertex without contrast.  Comparison: Head CT 05/14/2007  Findings: The patient is moving within the scanner.  Repeat exams were performed with no significant improvement. The exam is suboptimal.  There is a hypodense lesion within the right cerebellum.  No intracranial hemorrhage.   There is a rounded lesion within the CSF space adjacent to the right aspect of the pons which is not  seen on prior.  There is mild ventricular dilatation and periventricular white matter hypodensities  Paranasal sinuses and mastoid air  cells are clear.  Orbits are normal.  IMPRESSION:  1.  Age indeterminate right cerebellar infarction.  This could represent an acute infarction. 2.  New high density rounded lesion right adjacent to the pons. This could represent the dolichoectasia or aneurysm of the vertebral artery. A meningioma is a secondary consideration.   Original Report Authenticated By: Genevive Bi, M.D.   Mr Brain Wo Contrast  07/29/2012   *RADIOLOGY REPORT*  Clinical Data: Recent episode of facial droop and somewhat asymmetric weakness.  MRI HEAD WITHOUT CONTRAST  Technique:  Multiplanar, multiecho pulse sequences of the brain and surrounding structures were obtained according to standard protocol without intravenous contrast.  Comparison: Multiple priors.  Findings: The patient had difficulty remaining motionless for the study.  Images are suboptimal.  Small or subtle lesions could be overlooked.  No acute stroke or acute hemorrhage.  1 cm sized right lateral pontine cistern mass lesion with mild restricted diffusion likely meningioma.  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  Advanced atrophy and chronic microvascular ischemic change.  Small focus chronic hemorrhage left globus pallidus.  No midline shift.  No obvious vascular  occlusion.  No obvious osseous lesions.  IMPRESSION: Markedly degraded scan due to motion.  Suspect 1 cm meningioma right lateral pontine cistern mass.  No acute cerebral infarction is identified.  Advanced atrophy and chronic microvascular ischemic change.   Original Report Authenticated By: Davonna Belling, M.D.   Mr Brain Wo Contrast  07/06/2012   *RADIOLOGY REPORT*  Clinical Data: Progressive mental status changes.  Abnormal CT scan.  MRI HEAD WITHOUT CONTRAST  Technique:  Multiplanar, multiecho pulse sequences of the brain and surrounding structures were obtained according to standard protocol without intravenous contrast.  Comparison: CT head without contrast 07/05/2012   Findings: The diffusion weighted images demonstrate no evidence for acute or subacute infarction.  The right inferior cerebellar infarcts are remote.  Moderate atrophy and bilateral periventricular white matter disease is evident bilaterally. Remote lacunar infarcts are present in the basal ganglia bilaterally. A remote lacunar infarct is evident in the left cerebellum.  No acute infarct, hemorrhage, or mass lesion is present.  The ventricles are proportionate to the degree of atrophy.  The globes and orbits are intact. The paranasal sinuses and mastoid air cells are clear.  IMPRESSION:  1.  No acute intracranial abnormality. 2.  The right inferior cerebellar infarct is remote. 3.  Advanced atrophy and white matter disease likely reflects the sequelae of chronic microvascular ischemia in this patient with hypertension and end-stage renal disease. 4.  Remote lacunar infarcts of the basal ganglia and left cerebellum.   Original Report Authenticated By: Marin Roberts, M.D.   Dg Chest Port 1 View  07/26/2012   *RADIOLOGY REPORT*  Clinical Data: Atrial fibrillation  PORTABLE CHEST - 1 VIEW  Comparison: 07/19/2012  Findings: The cardiac shadow is stable.  The lungs are well-aerated bilaterally without evidence of focal infiltrate.  No acute bony abnormality is seen.  IMPRESSION: No acute abnormality noted.   Original Report Authenticated By: Alcide Clever, M.D.   Dg Chest Port 1 View  07/19/2012   *RADIOLOGY REPORT*  Clinical Data: Atelectasis.  PORTABLE CHEST - 1 VIEW  Comparison: 07/18/2012.  Findings: Endotracheal tube is in satisfactory position. Nasogastric tube is followed into the stomach.  Right subclavian central line tip projects over the SVC.  Heart size normal. Thoracic aorta is calcified.  Mild bibasilar air space disease with a small left pleural effusion.  There may be a tiny right pleural effusion as well.  IMPRESSION: Mild bibasilar air space disease and small left pleural effusion. Possible  tiny right pleural effusion as well.   Original Report Authenticated By: Leanna Battles, M.D.   Dg Chest Port 1 View  07/18/2012   *RADIOLOGY REPORT*  Clinical Data: Follow up atelectasis  PORTABLE CHEST - 1 VIEW  Comparison: Prior chest x-ray 07/17/2012  Findings: The tip of the endotracheal tube is less than 1 cm above the carina and is directed at the right mainstem bronchus.  The right subclavian approach central venous catheter is in unchanged position with the tip in the mid SVC.  Nasogastric tube is incompletely imaged, the tip lies below the field of view likely within the stomach.  Stable cardiomegaly.  No pneumothorax identified.  Small left pleural effusion and associated atelectasis.  Pulmonary vascular congestion is similar to slightly improved.  IMPRESSION:  1. The tip of the endotracheal tube is just above the carina and directed toward the right mainstem bronchus.  Recommend withdrawing 2 - 3 cm for more optimal placement. 2.  Similar to slightly improved pulmonary vascular congestion 3.  Small left  pleural effusion and associated atelectasis.  These results were called by telephone on 07/18/2012 at 07:45 a.m. to the patient's nurse, Leotis Shames,, who verbally acknowledged these results.   Original Report Authenticated By: Malachy Moan, M.D.   Dg Chest Port 1 View  07/17/2012   *RADIOLOGY REPORT*  Clinical Data: Evaluate atelectasis  PORTABLE CHEST - 1 VIEW  Comparison: 07/16/2012; 07/15/2012  Findings: Grossly unchanged cardiac silhouette and mediastinal contours given patient rotation.  Stable position of support apparatus.  No pneumothorax.  Grossly unchanged bibasilar heterogeneous opacity.  No new focal airspace opacity.  Mild pulmonary congestion without frank evidence of edema.  Trace right- sided pleural effusion is not excluded.  Unchanged bones.  IMPRESSION: 1.  Stable positioning of support apparatus.  No pneumothorax. 2.  Grossly unchanged findings of mild pulmonary venous congestion  and bibasilar opacities, atelectasis versus infiltrate.   Original Report Authenticated By: Tacey Ruiz, MD   Dg Chest Port 1 View  07/16/2012   *RADIOLOGY REPORT*  Clinical Data: Follow up atelectasis  PORTABLE CHEST - 1 VIEW  Comparison: Prior chest x-ray 07/15/2012  Findings: The tip of the endotracheal tube is 1 cm above the carina. A nasogastric tube is in place.  The tip lies below the diaphragm off the field of view, presumably within the stomach. Right subclavian approach central venous catheter in stable position with the tip in the mid SVC.  Stable appearance of the chest with persistent low inspiratory volumes and left greater than right retrocardiac opacities which could reflect atelectasis versus consolidation.  A prominent skin fold in the right upper lung gives a pseudo pneumothorax appearance.  No acute osseous abnormality.  IMPRESSION:  1.  A nasogastric tube is in place.  The tip lies below the diaphragm off the field of view, presumably within the stomach.  2. The tip of the endotracheal tube is low at 1 cm above the carina.    Consider retracting 2 cm for more optimal placement.  3.  Stable appearance of the lungs with left greater than right bibasilar retrocardiac opacities which could reflect atelectasis or infiltrate.   Original Report Authenticated By: Malachy Moan, M.D.   Dg Chest Port 1 View  07/15/2012   *RADIOLOGY REPORT*  Clinical Data: Evaluate endotracheal tube placement.  PORTABLE CHEST - 1 VIEW  Comparison: Chest x-ray 07/15/2012.  Findings: An endotracheal tube is in place with tip 3.1 cm above the carina. There is a right-sided subclavian central venous catheter with tip terminating in the mid superior vena cava. Lung volumes are low.  Opacity in the medial aspect of the left bases similar to the prior study and may reflect some atelectasis and/or consolidation.  No pleural effusions.  No evidence of pulmonary edema.  Heart size is normal.  Mediastinal contours are  unremarkable.  Atherosclerosis of the thoracic aorta.  IMPRESSION: 1.  Support apparatus, as above. 2.  Low lung volumes with atelectasis and/or consolidation in the medial aspect of left lower lobe. 3.  Atherosclerosis.   Original Report Authenticated By: Trudie Reed, M.D.   Dg Chest Port 1 View  07/15/2012   *RADIOLOGY REPORT*  Clinical Data: Assess right base atelectasis  PORTABLE CHEST - 1 VIEW  Comparison: 07/14/2012  Findings: Endotracheal tube terminates 2.5 cm above the carina.  Small bilateral pleural effusions.  Associated basilar opacities, left greater than right, likely atelectasis.  No pneumothorax.  The heart is normal in size.  Stable right subclavian venous catheter and enteric tube.  IMPRESSION: Endotracheal tube terminates  2.5 cm above the carina.  Small bilateral pleural effusions.  Associated bibasilar opacities, likely atelectasis, left greater than right.   Original Report Authenticated By: Charline Bills, M.D.   Dg Chest Port 1 View  07/14/2012   *RADIOLOGY REPORT*  Clinical Data: Evaluate endotracheal tube position  PORTABLE CHEST - 1 VIEW  Comparison: Portable chest x-ray of 07/12/2012  Findings:  The tip of the endotracheal tube appears to be somewhat near the carina, only 1.6 cm above the expected carina. There is little change in haziness at the lung bases consistent with atelectasis and effusions.  Mild cardiomegaly is stable with minimal pulmonary vascular congestion.  The right central venous line is unchanged in position with the tip overlying the lower SVC.  IMPRESSION:  1.  Tip of endotracheal tube approximately 1.6 cm above the carina. 2.  Little change in basilar atelectasis and effusions.   Original Report Authenticated By: Dwyane Dee, M.D.   Dg Chest Port 1 View  07/12/2012   *RADIOLOGY REPORT*  Clinical Data: Follow up respiratory failure  PORTABLE CHEST - 1 VIEW  Comparison: 08/07/2012  Findings: The endotracheal tube tip is stable above the carina. There is a  right subclavian catheter with tip in the projection of the cavoatrial junction.  Nasogastric tube is coiled in the stomach.  Lung volumes are decreased.  Small bilateral pleural effusions and pulmonary venous congestion persist.  IMPRESSION:  1.  Stable support apparatus. 2.  No change in small effusions and pulmonary venous congestion.   Original Report Authenticated By: Signa Kell, M.D.   Dg Chest Port 1 View  07/11/2012   *RADIOLOGY REPORT*  Clinical Data: Respiratory failure, follow-up  PORTABLE CHEST - 1 VIEW  Comparison: Portable chest x-ray of 07/10/2012  Findings: The tip of the endotracheal tube is very near the carina only 6 mm above, and withdrawing the endotracheal tube by 2 cm is recommended.  Left basilar opacity persists although aeration has improved slightly.  Also there appears to be a small left pleural effusion present.  The right central venous line tip is unchanged in position and an NG tube courses below the hemidiaphragm.  IMPRESSION:  1.  Slightly better aeration. 2.  Persistent left basilar opacity consistent with atelectasis, effusion, and possibly pneumonia. 3.  Tip of endotracheal tube very near the carina as noted above. Recommend withdrawing by 2 cm.   Original Report Authenticated By: Dwyane Dee, M.D.   Dg Chest Port 1 View  07/10/2012   *RADIOLOGY REPORT*  Clinical Data: Respiratory failure.  PORTABLE CHEST - 1 VIEW  Comparison: Chest 07/09/2012 and 07/08/2012.  Findings: Support tubes and lines are unchanged.  Bilateral effusions airspace disease persist and appear slightly increased. There is no pneumothorax.  Heart size is normal.  The patient is rotated on the study.  IMPRESSION: No change in small to moderate bilateral pleural effusions and basilar airspace disease.   Original Report Authenticated By: Holley Dexter, M.D.   Dg Chest Portable 1 View  07/09/2012   *RADIOLOGY REPORT*  Clinical Data: Edema versus pneumonia  PORTABLE CHEST - 1 VIEW  Comparison:   the  previous day's study  Findings: Endotracheal tube, nasogastric tube, and right subclavian central line are stable in position.  Infrahilar infiltrate or atelectasis, left greater than right, without convincing change from previous exam.  There may be small pleural effusions.  Heart size remains normal.  Spurring in the thoracic spine.  IMPRESSION:  1.  Little change in asymmetric bibasilar atelectasis or infiltrates with  possible small effusions. 2. Support hardware stable in position.   Original Report Authenticated By: D. Andria Rhein, MD   Dg Chest Port 1 View  07/08/2012   *RADIOLOGY REPORT*  Clinical Data: Intubated  PORTABLE CHEST - 1 VIEW  Comparison: 07/06/2012  Findings: Endotracheal tube tip 2.3 cm above carina.  Nasogastric tube and   right subclavian central line are stable in position. Persistent perihilar and left retrocardiac hazy infiltrates or atelectasis.  Suspect small pleural effusions.  Heart size remains normal.  Atheromatous aorta.  IMPRESSION:  1.  Little change   since previous day's portable chest exam   Original Report Authenticated By: D. Andria Rhein, MD   Dg Chest Port 1 View  07/06/2012   **ADDENDUM** CREATED: 07/06/2012 17:06:49  Additionally, there is an orogastric tube present, the tip of which appears coiled back on itself in the proximal stomach.  **END ADDENDUM** SIGNED BY: Florencia Reasons, M.D.  07/06/2012   *RADIOLOGY REPORT*  Clinical Data: Central line placement.  PORTABLE CHEST - 1 VIEW  Comparison: Chest x-ray 07/06/2012.  Findings: An endotracheal tube is in place with tip 3.9 cm above the carina. There is a right-sided subclavian central venous catheter with tip terminating in the distal superior vena cava. No pneumothorax.  Bibasilar opacities may reflect areas of atelectasis and/or consolidation.  Small bilateral pleural effusions.  No evidence of pulmonary edema.  Heart size is within normal limits. Upper mediastinal contours are unremarkable.  Atherosclerosis  in the thoracic aorta.  IMPRESSION: 1.  Support apparatus, as above.  No pneumothorax following right subclavian central venous catheter placement. 2.  Persistent bibasilar atelectasis and/or consolidation with small bilateral pleural effusions. 3.  Atherosclerosis.   Original Report Authenticated By: Trudie Reed, M.D.   Dg Chest Port 1 View  07/06/2012   *RADIOLOGY REPORT*  Clinical Data: Status post intubation.  PORTABLE CHEST - 1 VIEW  Comparison: Chest x-ray 07/05/2012.  Findings: An endotracheal tube is in place with tip 3.1 cm above the carina. Lung volumes are normal.  Worsening bibasilar opacities, which may represent areas of atelectasis and/or consolidation, with superimposed moderate right and small left pleural effusions.  Pulmonary vasculature now appears within normal limits.  Heart size is borderline enlarged.  Mediastinal contours are unremarkable.  Atherosclerosis of the thoracic aorta.  IMPRESSION: 1.  Tip of endotracheal tube appears properly located 3.1 cm above the carina. 2.  Interval resolution of pulmonary edema. 3.  Worsening bibasilar aeration, which may reflect increasing areas of atelectasis and/or consolidation, with increasing moderate right and small left-sided pleural effusions. 4.  Atherosclerosis.   Original Report Authenticated By: Trudie Reed, M.D.   Dg Abd Portable 1v  07/15/2012   *RADIOLOGY REPORT*  Clinical Data: NG tube placement.  PORTABLE ABDOMEN - 1 VIEW  Comparison: Chest x-ray 07/15/2012  Findings: Orogastric tube is in place, tip overlying the level of the duodenum, likely within the region of the bulb.  Bowel gas pattern is nonobstructive.  There are significant degenerative changes in the spine.  The patient has had previous right hip arthroplasty.  IMPRESSION: Orogastric tube tip overlying the level of the proximal duodenum.   Original Report Authenticated By: Norva Pavlov, M.D.   Dg Fluoro Guide Lumbar Puncture  07/06/2012   *RADIOLOGY REPORT*   Clinical Data:  Altered mental status.  DIAGNOSTIC LUMBAR PUNCTURE UNDER FLUOROSCOPIC GUIDANCE  Fluoroscopy time:  1 minute, 29 seconds.  Technique:  Informed consent was obtained from the patient prior to the procedure, including potential complications  of headache, allergy, and pain.   With the patient prone, the lower back was prepped with Betadine.  1% Lidocaine was used for local anesthesia. Lumbar puncture was performed at the L3-4 level using a 20 gauge needle with return of clear CSF with an opening pressure of 10 cm water.   8 ml of CSF were obtained for laboratory studies.  The patient tolerated the procedure well and there were no apparent complications.  IMPRESSION: Technically successful fluoro guided lumbar puncture as above.   Original Report Authenticated By: Charlett Nose, M.D.    Microbiology: No results found for this or any previous visit (from the past 240 hour(s)).   Labs: Basic Metabolic Panel:  Recent Labs Lab 07/26/12 0700 07/28/12 1307 07/29/12 0855 07/31/12 1711 08/01/12 0457  NA 129* 133* 136 126* 126*  K 5.1 4.6 3.3* 4.9 4.8  CL 89* 94* 97 88* 86*  CO2 20 22 27 22 21   GLUCOSE 105* 116* 117* 91 87  BUN 116* 61* 20 47* 56*  CREATININE 6.92* 5.65* 2.97* 4.74* 5.34*  CALCIUM 7.8* 7.9* 8.2* 8.4 8.4  MG 2.0  --   --   --   --   PHOS 6.9* 6.0*  --   --  4.9*   Liver Function Tests:  Recent Labs Lab 07/26/12 0700 07/28/12 1307 07/31/12 1711 08/01/12 0457  AST  --   --  21  --   ALT  --   --  19  --   ALKPHOS  --   --  95  --   BILITOT  --   --  0.1*  --   PROT  --   --  6.2  --   ALBUMIN 2.8* 2.4* 2.3* 2.3*    Recent Labs Lab 07/31/12 1711  LIPASE 66*   No results found for this basename: AMMONIA,  in the last 168 hours CBC:  Recent Labs Lab 07/26/12 0700 07/28/12 1307 07/29/12 0855 07/31/12 1711 08/01/12 0457  WBC 16.4* 16.3* 8.7 10.3 9.5  HGB 11.1* 10.8* 10.7* 11.1* 10.2*  HCT 32.9* 32.9* 33.2* 33.4* 30.6*  MCV 96.5 97.9 99.1 96.3 96.2   PLT 350 317 281 303 281   Cardiac Enzymes:  Recent Labs Lab 07/26/12 0801  TROPONINI <0.30   BNP: BNP (last 3 results)  Recent Labs  07/04/12 0724  PROBNP 45870.0*   CBG: No results found for this basename: GLUCAP,  in the last 168 hours  Time coordinating discharge: 60 minutes  Signed:  Christabell Loseke  [facility] 08/01/2012, 8:17 PM

## 2012-08-01 NOTE — Progress Notes (Signed)
S:No SOB No CP   A little difficulty mobilizing secretions O:BP 168/43  Pulse 61  Temp(Src) 97.9 F (36.6 C) (Oral)  Resp 18  Ht 5\' 5"  (1.651 m)  Wt 55 kg (121 lb 4.1 oz)  BMI 20.18 kg/m2  SpO2 98%  Intake/Output Summary (Last 24 hours) at 08/01/12 1610 Last data filed at 07/31/12 2227  Gross per 24 hour  Intake    483 ml  Output    250 ml  Net    233 ml   Weight change:  RUE:AVWUJ and alert CVS:RRR Resp:Scattered rhonchi Abd:+ BS NTND Ext:no edema  RUA AVF + bruit NEURO:CNI Ox3 no asterixis   . amiodarone  200 mg Oral Daily  . amLODipine  10 mg Oral Daily  . amoxicillin-clavulanate  1 tablet Oral q1800  . aspirin EC  81 mg Oral Daily  . calcium acetate  667 mg Oral TID WC  . darbepoetin (ARANESP) injection - DIALYSIS  40 mcg Intravenous Q Tue-HD  . doxercalciferol  2 mcg Intravenous Q T,Th,Sa-HD  . feeding supplement (NEPRO CARB STEADY)  237 mL Oral BID BM  . feeding supplement  30 mL Oral BID  . metoprolol tartrate  12.5 mg Oral BID  . multivitamin  1 tablet Oral QHS  . pantoprazole sodium  40 mg Per Tube BID  . sodium chloride  3 mL Intravenous Q12H   Dg Abd 1 View  07/31/2012   *RADIOLOGY REPORT*  Clinical Data: Left lower quadrant abdominal pain  ABDOMEN - 1 VIEW  Comparison: Abdomen film of 07/15/2012  Findings: Supine views of the abdomen show no bowel obstruction. There is very mild gaseous distention of the stomach.  There are diffuse degenerative changes throughout the lumbar spine.  Right total hip replacement is noted.  IMPRESSION: Nonspecific bowel gas pattern.  Diffuse degenerative change throughout the lumbar spine   Original Report Authenticated By: Dwyane Dee, M.D.   BMET    Component Value Date/Time   NA 126* 08/01/2012 0457   K 4.8 08/01/2012 0457   CL 86* 08/01/2012 0457   CO2 21 08/01/2012 0457   GLUCOSE 87 08/01/2012 0457   GLUCOSE 98 12/20/2005 1108   BUN 56* 08/01/2012 0457   CREATININE 5.34* 08/01/2012 0457   CALCIUM 8.4 08/01/2012 0457   CALCIUM 9.0 07/10/2010 1125   GFRNONAA 7* 08/01/2012 0457   GFRAA 8* 08/01/2012 0457   CBC    Component Value Date/Time   WBC 9.5 08/01/2012 0457   RBC 3.18* 08/01/2012 0457   HGB 10.2* 08/01/2012 0457   HCT 30.6* 08/01/2012 0457   PLT 281 08/01/2012 0457   MCV 96.2 08/01/2012 0457   MCH 32.1 08/01/2012 0457   MCHC 33.3 08/01/2012 0457   RDW 17.2* 08/01/2012 0457   LYMPHSABS 2.1 07/14/2012 0350   MONOABS 1.4* 07/14/2012 0350   EOSABS 0.3 07/14/2012 0350   BASOSABS 0.1 07/14/2012 0350     Assessment: 1.  Seizures ? Sec Cefepime 2. Paroxysmal A FIB, now NSR 3. HTN 4. Anemia on Aranesp 5. Sec HPTH on hectorol 6. Deconditioned Plan: 1. HD today 2. Will see if pulling fluid at HD helps with BP 3. Cont to work with PT Christine Graham T

## 2012-08-01 NOTE — Progress Notes (Signed)
Early this afternoon prior to lunch was notified by nurse tech that patient states she can not breathe. Nurse went in to assess patient. Oxygen saturation was 100 % on 2 liters of oxygen, but patient states that she felt more like she was choking and not getting air. Increased oxygen to 3 liters and patient stated she could breathe better but still had a choking sensation. Administered chloroseptic spray per PRN order and that had little to no effect. Therefore notified Dr. Malachi Bonds in which nurse was notified that ENT doctor would be called in to evaluate patient. Dr. Lazarus Salines performed ENT procedure to re-evaluate patient's throat area. Currently patient is alert, calm and currently has no discomfort with throat at this time. Will continue to monitor to end of shift.

## 2012-08-01 NOTE — Progress Notes (Signed)
Patient transferred to Hemodialysis with ticket to ride and left at change of shift. Notified oncoming nurse.   Also spoke to son and son notified nurse that he spoke to Genelle Bal, SW is in process of getting things lined up for patient to be transferred to a SNF for rehabilitaion. Notified night nurse to notify the next day nurse. Nurse signing off this time

## 2012-08-01 NOTE — Progress Notes (Signed)
The patient complained about right ear pain at the beginning of the night.  Tylenol, ear drops, and heat packs seemed to relieve her pain.  She rested comfortably during the night.

## 2012-08-02 LAB — RENAL FUNCTION PANEL
Albumin: 2.1 g/dL — ABNORMAL LOW (ref 3.5–5.2)
BUN: 17 mg/dL (ref 6–23)
CO2: 28 mEq/L (ref 19–32)
Chloride: 97 mEq/L (ref 96–112)
Creatinine, Ser: 2.38 mg/dL — ABNORMAL HIGH (ref 0.50–1.10)
GFR calc non Af Amer: 17 mL/min — ABNORMAL LOW (ref >90)
Potassium: 4 mEq/L (ref 3.5–5.1)

## 2012-08-02 LAB — CBC
Hemoglobin: 9.9 g/dL — ABNORMAL LOW (ref 12.0–15.0)
MCH: 32.4 pg (ref 26.0–34.0)
Platelets: 243 10*3/uL (ref 150–400)
RBC: 3.06 MIL/uL — ABNORMAL LOW (ref 3.87–5.11)
WBC: 7.5 10*3/uL (ref 4.0–10.5)

## 2012-08-02 NOTE — Progress Notes (Signed)
S:No SOB No CP  Ate most of her breakfast O:BP 148/56  Pulse 68  Temp(Src) 98.2 F (36.8 C) (Oral)  Resp 17  Ht 5\' 5"  (1.651 m)  Wt 58 kg (127 lb 13.9 oz)  BMI 21.28 kg/m2  SpO2 98%  Intake/Output Summary (Last 24 hours) at 08/02/12 0905 Last data filed at 08/01/12 2328  Gross per 24 hour  Intake    440 ml  Output   2037 ml  Net  -1597 ml   Weight change:  RUE:AVWUJ and alert CVS:RRR Resp clear Abd:+ BS NTND Ext:no edema  RUA AVF + bruit NEURO:CNI Ox3 no asterixis   . acetaminophen  650 mg Oral QHS  . amiodarone  200 mg Oral Daily  . amLODipine  10 mg Oral Daily  . amoxicillin-clavulanate  1 tablet Oral q1800  . aspirin EC  81 mg Oral Daily  . calcium acetate  667 mg Oral TID WC  . darbepoetin (ARANESP) injection - DIALYSIS  40 mcg Intravenous Q Tue-HD  . doxercalciferol  2 mcg Intravenous Q T,Th,Sa-HD  . feeding supplement (NEPRO CARB STEADY)  237 mL Oral BID BM  . feeding supplement  30 mL Oral BID  . metoprolol tartrate  12.5 mg Oral BID  . multivitamin  1 tablet Oral QHS  . pantoprazole sodium  40 mg Per Tube BID  . sodium chloride  3 mL Intravenous Q12H   Dg Abd 1 View  07/31/2012   *RADIOLOGY REPORT*  Clinical Data: Left lower quadrant abdominal pain  ABDOMEN - 1 VIEW  Comparison: Abdomen film of 07/15/2012  Findings: Supine views of the abdomen show no bowel obstruction. There is very mild gaseous distention of the stomach.  There are diffuse degenerative changes throughout the lumbar spine.  Right total hip replacement is noted.  IMPRESSION: Nonspecific bowel gas pattern.  Diffuse degenerative change throughout the lumbar spine   Original Report Authenticated By: Dwyane Dee, M.D.   BMET    Component Value Date/Time   NA 135 08/02/2012 0605   K 4.0 08/02/2012 0605   CL 97 08/02/2012 0605   CO2 28 08/02/2012 0605   GLUCOSE 88 08/02/2012 0605   GLUCOSE 98 12/20/2005 1108   BUN 17 08/02/2012 0605   CREATININE 2.38* 08/02/2012 0605   CALCIUM 8.2* 08/02/2012 0605   CALCIUM 9.0 07/10/2010 1125   GFRNONAA 17* 08/02/2012 0605   GFRAA 20* 08/02/2012 0605   CBC    Component Value Date/Time   WBC 7.5 08/02/2012 0605   RBC 3.06* 08/02/2012 0605   HGB 9.9* 08/02/2012 0605   HCT 29.7* 08/02/2012 0605   PLT 243 08/02/2012 0605   MCV 97.1 08/02/2012 0605   MCH 32.4 08/02/2012 0605   MCHC 33.3 08/02/2012 0605   RDW 17.8* 08/02/2012 0605   LYMPHSABS 2.1 07/14/2012 0350   MONOABS 1.4* 07/14/2012 0350   EOSABS 0.3 07/14/2012 0350   BASOSABS 0.1 07/14/2012 0350     Assessment: 1.  Seizures ? Sec Cefepime 2. Paroxysmal A FIB, now NSR 3. HTN 4. Anemia on Aranesp 5. Sec HPTH on hectorol 6. Deconditioned Plan: 1. HD tomorrow if still here 2. Cont to work with PT .  ? DC to NH today Deepa Barthel T

## 2012-08-02 NOTE — Progress Notes (Signed)
Pt for transfer to [facility]. Report given to nurse at [facility]. Pt is aware of her departure as per son. Pt is awake and alert, in no distress upon d/c.

## 2012-08-02 NOTE — Discharge Summary (Signed)
Physician Discharge Summary  Christine Graham ZOX:[id number] DOB: 1925/08/29 DOA: 07/04/2012  PCP: Judie Petit, MD  Admit date: 07/04/2012 Discharge date: 08/02/2012  Recommendations for Outpatient Follow-up:  1. To skilled nursing for ongoing physical and occupational therapy 2. Continue dialysis on Tuesday, Thursday, Saturday schedule 3. Dr. Lazarus Salines, ENT, in 2 weeks for followup of airway ulcerations and trauma 4. [place] [facility] in 1 month for followup new onset atrial fibrillation with RVR. 5. Continue Augmentin and auralgan for total of 7 days, last day on June 29th.  Please give 500mg /125mg  dose at 1800 daily.  Please give dose today (6/25).  Discharge Diagnoses:  Active Problems:   HYPERLIPIDEMIA   GOUT   HYPERTENSION   ESRD on dialysis   Acute encephalopathy   Acute diastolic CHF (congestive heart failure)   Acute respiratory failure   Status epilepticus   Muscular deconditioning   Postextubation stridor   Diarrhea   Other dysphagia   Trauma to vocal cord   Atrial fibrillation   Otitis media of right ear   Abdominal pain, left lower quadrant   Discharge Condition: fair  Diet recommendation: Dysphagia 2 with thin liquids  Wt Readings from Last 3 Encounters:  08/02/12 58 kg (127 lb 13.9 oz)  06/30/12 59.421 kg (131 lb)  05/17/12 60.328 kg (133 lb)    History of present illness:  Christine Graham is a 79 y.o. female with pmh of ESRD, HTN, HLD, AOCD; came to ED complaining of increased SOB and non productive cough. Patient reprots symptoms has been present for the last 1-2 weeks and initially associated with nausea/vomiting and loose stools; now no further nausea or vomiting, but still with loose stool. She is also complaining of general malaise, weakness and increase SOB and cough. Patient denies CP, fever, chills, dysuria, melena, hematochezia and hematemesis. In ED CXR demonstrating bilateral infiltrates (concers for PNA vs edema); BNP is elevated and had WBC's  in the 14,000 range.  Hospital Course:   SUMMARY:  Patient is an 79 year old female with history of end-stage renal disease, hypertension, hyperlipidemia. She presented on May 27 with an apparent viral syndrome with leukocytosis and pulmonary edema on her chest x-ray. There was concern she had healthcare associated pneumonia and was started on vancomycin and cefepime. After initiation of antibiotics she had progressive altered mental status that led to obtundation. She had an EEG which demonstrated epileptiform activity and a negative brain MRI. She was loaded with Ativan and Dilantin but continued to have nonconvulsive seizures so she was intubated and placed on a propofol drip. She was extubated on June 7 but immediately reintubated due to stridor. She was started on Decadron. She was extubated again on June 11. She continued to have stridor after extubation and was evaluated by ENT. Laryngoscopy on June 16 demonstrated intubation trauma with ulceration of the right arytenoid and subglottic posterior commissure. She is at risk of progression and subglottic stenosis.  Her antiepileptic medications were discontinued as this was thought to be an acute event due to a rare toxicity of cefepime. She has remained seizure-free.  She was initially so weak she can barely lift her finger off the bed, however now she is able to sit in the chair for dialysis.  She developed atrial fibrillation with RVR on June 18 with hypotension. She cardioverted with amiodarone and [place] [facility]ology was consulted.  She should continue amiodarone and followup with him in clinic in a few weeks. She will continue aspirin 81 mg.  I discussed risks and  benefits of anticoagulation with the family and they declined.  Recently she has had problems with right ear pain and is being treated for right otitis media. She's also had some left lower quadrant pain which may be due to urinary tract infection. Urine culture showed 30,000 colonies of  multiple morphotypes of bacteria. The patient will continue on Augmentin with last day of antibiotics on 08/06/12.  On the day of discharge, patient denied any further abdominal pain or ear pain.  She continued to work with PT and OT.  Speech therapy recommended dysphagia 2 diet.  On the day of discharge, pt's son was updated at bedside and all questions answered.  Coordination of care with social work, Erie Noe Crawford--pt will be going to [employer].  By systems: Acute respiratory failure -?Cefepime-induced neurotoxicity? requiring intubation. Resolved  Postextubation stridor due to vocal cord trauma with eschar and ulceration - Reevaluated by ENT on 6/24 and appears to be improving - Continue protonix  - Flutter valve   Dysphagia due to vocal cord injury and severe weakness, improving  - Patient and family aware of risk of aspiration with thin liquids  - Patient is at risk of dehydration because she refused thickened liquid  - Continue dysphagia 2 diet with thin liquids -pt will follow up with Dr. Flo Shanks in 2 weeks  Left ear pain. Possibly some dullness to the eardrum, but no surrounding erythema. The pain had not gotten better and may be getting worse so started treatment for right acute otitis media  - Continue Auralgan and warm compresses  - Augmentin renally dosed started 6/23  -pain improved and pt had no pain on day of d/c -plan Augmentin with last day on 08/06/12  Left lower quadrant pain. Occurred only during exam. Differential diagnoses include urinary tract infection, diverticulitis, gas pains, constipation. She had a bowel movement which was normal just a few hours ago.  - CBC: White blood cell count close to normal limits  - Liver function tests and lipase: Within normal limits  - Urinalysis with 11-20 WBC and + LE.  - 6/23 urine culture with 30K colonies of multiple bacterial morphoytpes - KUB: With gas. No evidence of constipation or perforation  -pt  tolerating diet and stated pain improved on day of discharge  Atrial fibrillation with RVR, new onset on 6/18 during dialysis. Troponin negative, TSH wnl. CHAD2vasc score is 4, 4% annual risk of stroke. Has bled score of 2, 1.88-4.1% annual risk of bleed. Was started on amiodarone gtt and cardioverted. Reverted to a-fib once more during admission, but NSR and sinus bradycardia to the high 50s for the last two days.  - ASA 81mg  daily  - Transitioned to PO amiodarone on 6/21  - Family and patient to discuss risk/benefit of anticoagulation  - Consider decreasing the dose of beta blocker if this persists or if the patient becomes hypotensive or symptomatic  -pt will need to follow up with [facility] in 2-4 weeks  Possible TIA on June 18 with possible transient facial droop, tongue deviation, and increased slurred speech  - 6/20 MRI brain negative for acute stroke   Severe weakness and deconditioning improving.  - Continue physical therapy and occupational therapy   Severe protein calorie malnutrition with poor by mouth intake, appetite improving but states she feels full after her meals  - Liberalize diet and continue supplements   Acute hypoxic respiratory failure possible due to prolonged intubation and muscle weakness. - Continue IS  - Wean oxygen  as tolerated   Diarrhea, resolved, C. difficile negative.   HYPERTENSION, Blood pressures previously elevated but had severe hypotension during dialysis previously.  - Continue hold parameters for Norvasc and metoprolol   Acute diastolic CHF (congestive heart failure), compensated  HYPERLIPIDEMIA, stable chronic.  Okay to restart statin at discharge. ESRD on dialysis T,H,Sa, per nephrology.  -notified renal service of pt's d/c on 08/02/12 for coordination of out pt HD on 07/24/12 GOUT, asymptomatic  Muscular deconditioning, severe. To skilled nursing.   Consultants:  Nephrology  CCM signed off  neurohospitalist signed off  ENT  [facility]  Cardiology Procedures:  ETT 5/29 >> 6/07  R Selz CVC 5/29 >> 6/12  Rt graft>>>  ETT 6/07 >> 6/11  Laryngoscopy 6/16  Antibiotics:  Vanco 5/27 >> 5/30  Cefepime 5/27 >> 5/30  Zosyn 5/28 x1 dose  Acyclovir 5/29 >> 5/31  Augmentin 6/23 >> Allergies:  Cefepime, codeine, oxycodone  Discharge Exam: Filed Vitals:   08/02/12 1009  BP: 138/56  Pulse:   Temp:   Resp:    Filed Vitals:   08/01/12 2300 08/02/12 0106 08/02/12 0504 08/02/12 1009  BP: 151/30 154/48 148/56 138/56  Pulse: 74 70 68   Temp:  97.8 F (36.6 C) 98.2 F (36.8 C)   TempSrc:  Oral Oral   Resp: 16 16 17    Height:      Weight:   58 kg (127 lb 13.9 oz)   SpO2:  100% 98%     General: Caucasian female, No acute distress, answers appropriately HEENT: NCAT,   Cardiovascular: RRR, nl S1, S2 no mrg, 2+ pulses, warm extremities  Respiratory: no rales or rhonchi or wheeze. No increased WOB  Abdomen:  soft, nondistended, soft+BS/ MSK: Normal tone Neuro: Able to lift her arms and lower legs off the bed antigravity. Grip 4/5. No facial droop. Her voice--without hoarseness   Discharge Instructions      Discharge Orders   Future Orders Complete By Expires     Call MD for:  difficulty breathing, headache or visual disturbances  As directed     Call MD for:  extreme fatigue  As directed     Call MD for:  hives  As directed     Call MD for:  persistant dizziness or light-headedness  As directed     Call MD for:  persistant nausea and vomiting  As directed     Call MD for:  severe uncontrolled pain  As directed     Call MD for:  temperature >100.4  As directed     Diet general  As directed     Comments:      Dysphagia 2 with thin liquids    Increase activity slowly  As directed     Increase activity slowly  As directed         Medication List    STOP taking these medications       amoxicillin 500 MG capsule  Commonly known as:  AMOXIL     atenolol 50 MG tablet  Commonly known as:  TENORMIN       TAKE these medications       acetaminophen 325 MG tablet  Commonly known as:  TYLENOL  Take 650 mg by mouth every 6 (six) hours as needed. For pain/fever     ALPRAZolam 0.25 MG tablet  Commonly known as:  XANAX  Take 1 tablet (0.25 mg total) by mouth 2 (two) times daily as needed. For anxiety  amiodarone 200 MG tablet  Commonly known as:  PACERONE  Take 1 tablet (200 mg total) by mouth daily.     amLODipine 10 MG tablet  Commonly known as:  NORVASC  Take 1 tablet (10 mg total) by mouth daily.     amoxicillin-clavulanate 500-125 MG per tablet  Commonly known as:  AUGMENTIN  Take 1 tablet (500 mg total) by mouth daily at 6 PM.     antipyrine-benzocaine otic solution  Commonly known as:  AURALGAN  Place 3-4 drops into the right ear every 2 (two) hours as needed for pain.     aspirin EC 81 MG tablet  Take 81 mg by mouth daily.     calcium acetate 667 MG capsule  Commonly known as:  PHOSLO  Take 1 capsule (667 mg total) by mouth 3 (three) times daily with meals.     feeding supplement (NEPRO CARB STEADY) Liqd  Take 237 mLs by mouth 2 (two) times daily between meals.     feeding supplement Liqd  Take 30 mLs by mouth 2 (two) times daily.     metoprolol tartrate 12.5 mg Tabs  Commonly known as:  LOPRESSOR  Take 0.5 tablets (12.5 mg total) by mouth 2 (two) times daily.     multivitamin with minerals Tabs  Take 1 tablet by mouth daily.     pantoprazole sodium 40 mg/20 mL Pack  Commonly known as:  PROTONIX  Take 20 mLs (40 mg total) by mouth 2 (two) times daily.     phenol 1.4 % Liqd  Commonly known as:  CHLORASEPTIC  Use as directed 1 spray in the mouth or throat as needed.     rosuvastatin 20 MG tablet  Commonly known as:  CRESTOR  Take 20 mg by mouth daily.       Follow-up Information   Follow up with Judie Petit, MD. Schedule an appointment as soon as possible for a visit in 2 weeks.   Contact information:   [address] Christena Flake [place]  [address] [phone]       Follow up with [facility] Main Office [facility]). Schedule an appointment as soon as possible for a visit in 1 month.   Contact information:   [address], [address] [place] [address] [phone]      Follow up with Flo Shanks, MD. Schedule an appointment as soon as possible for a visit in 2 weeks.   Contact information:   [address], [address] [place] [address] [phone]       Follow up with Hemodialysis.   Contact information:   Tuesday, Thursday, Saturday       The results of significant diagnostics from this hospitalization (including imaging, microbiology, ancillary and laboratory) are listed below for reference.    Significant Diagnostic Studies: Dg Chest 2 View  07/05/2012   *RADIOLOGY REPORT*  Clinical Data: Follow up shortness of breath  CHEST - 2 VIEW  Comparison: 07/04/2012  Findings: Normal heart size. There are low lung volumes.  Bilateral pleural effusions are identified and appear similar to previous exam.  There is moderate interstitial edema.  Review of the visualized osseous structures is significant for thoracic spondylosis.  IMPRESSION:  1.  Moderate CHF.  This is not significantly improved from previous exam.   Original Report Authenticated By: Signa Kell, M.D.   Dg Chest 2 View  07/04/2012   *RADIOLOGY REPORT*  Clinical Data: Bilateral airspace disease versus edema check for improvement following dialysis  CHEST - 2 VIEW  Comparison: 07/04/2012 07:43 hours  Findings: Cardiac shadow is stable.  There is some left basilar infiltrate stable in appearance.  There has been some improvement in the degree of central vascular congestion when compared with the prior exam.  IMPRESSION: Improved appearance consistent with edema improvement following dialysis. There are persistent bibasilar changes worst on the left.   Original Report Authenticated By: Alcide Clever, M.D.   Dg Chest 2 View  07/04/2012    *RADIOLOGY REPORT*  Clinical Data: Shortness of breath and cough.  CHEST - 2 VIEW  Comparison: PA and lateral chest 07/05/2011.  Findings: The patient has small bilateral pleural effusions, larger on the left.  Bilateral airspace disease also appears worse on the left.  Heart size is upper normal.  No pneumothorax.  IMPRESSION: Bilateral airspace disease and small effusions, worse on the left, are likely due to asymmetric edema rather than pneumonia.   Original Report Authenticated By: Holley Dexter, M.D.   Dg Abd 1 View  07/31/2012   *RADIOLOGY REPORT*  Clinical Data: Left lower quadrant abdominal pain  ABDOMEN - 1 VIEW  Comparison: Abdomen film of 07/15/2012  Findings: Supine views of the abdomen show no bowel obstruction. There is very mild gaseous distention of the stomach.  There are diffuse degenerative changes throughout the lumbar spine.  Right total hip replacement is noted.  IMPRESSION: Nonspecific bowel gas pattern.  Diffuse degenerative change throughout the lumbar spine   Original Report Authenticated By: Dwyane Dee, M.D.   Ct Head Wo Contrast  07/08/2012   *RADIOLOGY REPORT*  Clinical Data: Seizure-like activity.  Possible encephalopathy. The patient is not aroused a voice.,.  CT HEAD WITHOUT CONTRAST  Technique:  Contiguous axial images were obtained from the base of the skull through the vertex without contrast.  Comparison: MRI of the head without contrast 07/05/2012.  Findings: A remote right inferior cerebellar infarct is again noted.  Atrophy and moderate to periventricular white matter disease is evident bilaterally.  No acute cortical infarct, hemorrhage, or mass lesion is present.  There is no significant interval change.  The paranasal sinuses and mastoid air cells are clear.  The osseous skull is intact.  Note is made of hyperostosis frontalis internus.  IMPRESSION:  1.  Stable atrophy and diffuse nonspecific white matter disease. This could be related to a diffuse encephalopathy.   It is not significantly changed since 2009. 2.  Remote right PICA territory infarct.   Original Report Authenticated By: Marin Roberts, M.D.   Ct Head Wo Contrast  07/05/2012   *RADIOLOGY REPORT*  Clinical Data: Altered mental status  CT HEAD WITHOUT CONTRAST  Technique:  Contiguous axial images were obtained from the base of the skull through the vertex without contrast.  Comparison: Head CT 05/14/2007  Findings: The patient is moving within the scanner.  Repeat exams were performed with no significant improvement. The exam is suboptimal.  There is a hypodense lesion within the right cerebellum.  No intracranial hemorrhage.   There is a rounded lesion within the CSF space adjacent to the right aspect of the pons which is not  seen on prior.  There is mild ventricular dilatation and periventricular white matter hypodensities  Paranasal sinuses and mastoid air cells are clear.  Orbits are normal.  IMPRESSION:  1.  Age indeterminate right cerebellar infarction.  This could represent an acute infarction. 2.  New high density rounded lesion right adjacent to the pons. This could represent the dolichoectasia or aneurysm of the  vertebral artery. A meningioma is a secondary consideration.   Original Report Authenticated By: Genevive Bi, M.D.   Mr Brain Wo Contrast  07/29/2012   *RADIOLOGY REPORT*  Clinical Data: Recent episode of facial droop and somewhat asymmetric weakness.  MRI HEAD WITHOUT CONTRAST  Technique:  Multiplanar, multiecho pulse sequences of the brain and surrounding structures were obtained according to standard protocol without intravenous contrast.  Comparison: Multiple priors.  Findings: The patient had difficulty remaining motionless for the study.  Images are suboptimal.  Small or subtle lesions could be overlooked.  No acute stroke or acute hemorrhage.  1 cm sized right lateral pontine cistern mass lesion with mild restricted diffusion likely meningioma.  Remote right inferior  cerebellar artery PICA territory infarct.  Advanced atrophy and chronic microvascular ischemic change.  Small focus chronic hemorrhage left globus pallidus.  No midline shift.  No obvious vascular occlusion.  No obvious osseous lesions.  IMPRESSION: Markedly degraded scan due to motion.  Suspect 1 cm meningioma right lateral pontine cistern mass.  No acute cerebral infarction is identified.  Advanced atrophy and chronic microvascular ischemic change.   Original Report Authenticated By: Davonna Belling, M.D.   Mr Brain Wo Contrast  07/06/2012   *RADIOLOGY REPORT*  Clinical Data: Progressive mental status changes.  Abnormal CT scan.  MRI HEAD WITHOUT CONTRAST  Technique:  Multiplanar, multiecho pulse sequences of the brain and surrounding structures were obtained according to standard protocol without intravenous contrast.  Comparison: CT head without contrast 07/05/2012  Findings: The diffusion weighted images demonstrate no evidence for acute or subacute infarction.  The right inferior cerebellar infarcts are remote.  Moderate atrophy and bilateral periventricular white matter disease is evident bilaterally. Remote lacunar infarcts are present in the basal ganglia bilaterally. A remote lacunar infarct is evident in the left cerebellum.  No acute infarct, hemorrhage, or mass lesion is present.  The ventricles are proportionate to the degree of atrophy.  The globes and orbits are intact. The paranasal sinuses and mastoid air cells are clear.  IMPRESSION:  1.  No acute intracranial abnormality. 2.  The right inferior cerebellar infarct is remote. 3.  Advanced atrophy and white matter disease likely reflects the sequelae of chronic microvascular ischemia in this patient with hypertension and end-stage renal disease. 4.  Remote lacunar infarcts of the basal ganglia and left cerebellum.   Original Report Authenticated By: Marin Roberts, M.D.   Dg Chest Port 1 View  07/26/2012   *RADIOLOGY REPORT*  Clinical Data:  Atrial fibrillation  PORTABLE CHEST - 1 VIEW  Comparison: 07/19/2012  Findings: The cardiac shadow is stable.  The lungs are well-aerated bilaterally without evidence of focal infiltrate.  No acute bony abnormality is seen.  IMPRESSION: No acute abnormality noted.   Original Report Authenticated By: Alcide Clever, M.D.   Dg Chest Port 1 View  07/19/2012   *RADIOLOGY REPORT*  Clinical Data: Atelectasis.  PORTABLE CHEST - 1 VIEW  Comparison: 07/18/2012.  Findings: Endotracheal tube is in satisfactory position. Nasogastric tube is followed into the stomach.  Right subclavian central line tip projects over the SVC.  Heart size normal. Thoracic aorta is calcified.  Mild bibasilar air space disease with a small left pleural effusion.  There may be a tiny right pleural effusion as well.  IMPRESSION: Mild bibasilar air space disease and small left pleural effusion. Possible tiny right pleural effusion as well.   Original Report Authenticated By: Leanna Battles, M.D.   Dg Chest Port 1 View  07/18/2012   *  RADIOLOGY REPORT*  Clinical Data: Follow up atelectasis  PORTABLE CHEST - 1 VIEW  Comparison: Prior chest x-ray 07/17/2012  Findings: The tip of the endotracheal tube is less than 1 cm above the carina and is directed at the right mainstem bronchus.  The right subclavian approach central venous catheter is in unchanged position with the tip in the mid SVC.  Nasogastric tube is incompletely imaged, the tip lies below the field of view likely within the stomach.  Stable cardiomegaly.  No pneumothorax identified.  Small left pleural effusion and associated atelectasis.  Pulmonary vascular congestion is similar to slightly improved.  IMPRESSION:  1. The tip of the endotracheal tube is just above the carina and directed toward the right mainstem bronchus.  Recommend withdrawing 2 - 3 cm for more optimal placement. 2.  Similar to slightly improved pulmonary vascular congestion 3.  Small left pleural effusion and associated  atelectasis.  These results were called by telephone on 07/18/2012 at 07:45 a.m. to the patient's nurse, Leotis Shames,, who verbally acknowledged these results.   Original Report Authenticated By: Malachy Moan, M.D.   Dg Chest Port 1 View  07/17/2012   *RADIOLOGY REPORT*  Clinical Data: Evaluate atelectasis  PORTABLE CHEST - 1 VIEW  Comparison: 07/16/2012; 07/15/2012  Findings: Grossly unchanged cardiac silhouette and mediastinal contours given patient rotation.  Stable position of support apparatus.  No pneumothorax.  Grossly unchanged bibasilar heterogeneous opacity.  No new focal airspace opacity.  Mild pulmonary congestion without frank evidence of edema.  Trace right- sided pleural effusion is not excluded.  Unchanged bones.  IMPRESSION: 1.  Stable positioning of support apparatus.  No pneumothorax. 2.  Grossly unchanged findings of mild pulmonary venous congestion and bibasilar opacities, atelectasis versus infiltrate.   Original Report Authenticated By: Tacey Ruiz, MD   Dg Chest Port 1 View  07/16/2012   *RADIOLOGY REPORT*  Clinical Data: Follow up atelectasis  PORTABLE CHEST - 1 VIEW  Comparison: Prior chest x-ray 07/15/2012  Findings: The tip of the endotracheal tube is 1 cm above the carina. A nasogastric tube is in place.  The tip lies below the diaphragm off the field of view, presumably within the stomach. Right subclavian approach central venous catheter in stable position with the tip in the mid SVC.  Stable appearance of the chest with persistent low inspiratory volumes and left greater than right retrocardiac opacities which could reflect atelectasis versus consolidation.  A prominent skin fold in the right upper lung gives a pseudo pneumothorax appearance.  No acute osseous abnormality.  IMPRESSION:  1.  A nasogastric tube is in place.  The tip lies below the diaphragm off the field of view, presumably within the stomach.  2. The tip of the endotracheal tube is low at 1 cm above the carina.     Consider retracting 2 cm for more optimal placement.  3.  Stable appearance of the lungs with left greater than right bibasilar retrocardiac opacities which could reflect atelectasis or infiltrate.   Original Report Authenticated By: Malachy Moan, M.D.   Dg Chest Port 1 View  07/15/2012   *RADIOLOGY REPORT*  Clinical Data: Evaluate endotracheal tube placement.  PORTABLE CHEST - 1 VIEW  Comparison: Chest x-ray 07/15/2012.  Findings: An endotracheal tube is in place with tip 3.1 cm above the carina. There is a right-sided subclavian central venous catheter with tip terminating in the mid superior vena cava. Lung volumes are low.  Opacity in the medial aspect of the left bases similar to  the prior study and may reflect some atelectasis and/or consolidation.  No pleural effusions.  No evidence of pulmonary edema.  Heart size is normal.  Mediastinal contours are unremarkable.  Atherosclerosis of the thoracic aorta.  IMPRESSION: 1.  Support apparatus, as above. 2.  Low lung volumes with atelectasis and/or consolidation in the medial aspect of left lower lobe. 3.  Atherosclerosis.   Original Report Authenticated By: Trudie Reed, M.D.   Dg Chest Port 1 View  07/15/2012   *RADIOLOGY REPORT*  Clinical Data: Assess right base atelectasis  PORTABLE CHEST - 1 VIEW  Comparison: 07/14/2012  Findings: Endotracheal tube terminates 2.5 cm above the carina.  Small bilateral pleural effusions.  Associated basilar opacities, left greater than right, likely atelectasis.  No pneumothorax.  The heart is normal in size.  Stable right subclavian venous catheter and enteric tube.  IMPRESSION: Endotracheal tube terminates 2.5 cm above the carina.  Small bilateral pleural effusions.  Associated bibasilar opacities, likely atelectasis, left greater than right.   Original Report Authenticated By: Charline Bills, M.D.   Dg Chest Port 1 View  07/14/2012   *RADIOLOGY REPORT*  Clinical Data: Evaluate endotracheal tube position   PORTABLE CHEST - 1 VIEW  Comparison: Portable chest x-ray of 07/12/2012  Findings:  The tip of the endotracheal tube appears to be somewhat near the carina, only 1.6 cm above the expected carina. There is little change in haziness at the lung bases consistent with atelectasis and effusions.  Mild cardiomegaly is stable with minimal pulmonary vascular congestion.  The right central venous line is unchanged in position with the tip overlying the lower SVC.  IMPRESSION:  1.  Tip of endotracheal tube approximately 1.6 cm above the carina. 2.  Little change in basilar atelectasis and effusions.   Original Report Authenticated By: Dwyane Dee, M.D.   Dg Chest Port 1 View  07/12/2012   *RADIOLOGY REPORT*  Clinical Data: Follow up respiratory failure  PORTABLE CHEST - 1 VIEW  Comparison: 08/07/2012  Findings: The endotracheal tube tip is stable above the carina. There is a right subclavian catheter with tip in the projection of the cavoatrial junction.  Nasogastric tube is coiled in the stomach.  Lung volumes are decreased.  Small bilateral pleural effusions and pulmonary venous congestion persist.  IMPRESSION:  1.  Stable support apparatus. 2.  No change in small effusions and pulmonary venous congestion.   Original Report Authenticated By: Signa Kell, M.D.   Dg Chest Port 1 View  07/11/2012   *RADIOLOGY REPORT*  Clinical Data: Respiratory failure, follow-up  PORTABLE CHEST - 1 VIEW  Comparison: Portable chest x-ray of 07/10/2012  Findings: The tip of the endotracheal tube is very near the carina only 6 mm above, and withdrawing the endotracheal tube by 2 cm is recommended.  Left basilar opacity persists although aeration has improved slightly.  Also there appears to be a small left pleural effusion present.  The right central venous line tip is unchanged in position and an NG tube courses below the hemidiaphragm.  IMPRESSION:  1.  Slightly better aeration. 2.  Persistent left basilar opacity consistent with  atelectasis, effusion, and possibly pneumonia. 3.  Tip of endotracheal tube very near the carina as noted above. Recommend withdrawing by 2 cm.   Original Report Authenticated By: Dwyane Dee, M.D.   Dg Chest Port 1 View  07/10/2012   *RADIOLOGY REPORT*  Clinical Data: Respiratory failure.  PORTABLE CHEST - 1 VIEW  Comparison: Chest 07/09/2012 and 07/08/2012.  Findings: Support  tubes and lines are unchanged.  Bilateral effusions airspace disease persist and appear slightly increased. There is no pneumothorax.  Heart size is normal.  The patient is rotated on the study.  IMPRESSION: No change in small to moderate bilateral pleural effusions and basilar airspace disease.   Original Report Authenticated By: Holley Dexter, M.D.   Dg Chest Portable 1 View  07/09/2012   *RADIOLOGY REPORT*  Clinical Data: Edema versus pneumonia  PORTABLE CHEST - 1 VIEW  Comparison:   the previous day's study  Findings: Endotracheal tube, nasogastric tube, and right subclavian central line are stable in position.  Infrahilar infiltrate or atelectasis, left greater than right, without convincing change from previous exam.  There may be small pleural effusions.  Heart size remains normal.  Spurring in the thoracic spine.  IMPRESSION:  1.  Little change in asymmetric bibasilar atelectasis or infiltrates with possible small effusions. 2. Support hardware stable in position.   Original Report Authenticated By: D. Andria Rhein, MD   Dg Chest Port 1 View  07/08/2012   *RADIOLOGY REPORT*  Clinical Data: Intubated  PORTABLE CHEST - 1 VIEW  Comparison: 07/06/2012  Findings: Endotracheal tube tip 2.3 cm above carina.  Nasogastric tube and   right subclavian central line are stable in position. Persistent perihilar and left retrocardiac hazy infiltrates or atelectasis.  Suspect small pleural effusions.  Heart size remains normal.  Atheromatous aorta.  IMPRESSION:  1.  Little change   since previous day's portable chest exam   Original Report  Authenticated By: D. Andria Rhein, MD   Dg Chest Port 1 View  07/06/2012   **ADDENDUM** CREATED: 07/06/2012 17:06:49  Additionally, there is an orogastric tube present, the tip of which appears coiled back on itself in the proximal stomach.  **END ADDENDUM** SIGNED BY: Florencia Reasons, M.D.  07/06/2012   *RADIOLOGY REPORT*  Clinical Data: Central line placement.  PORTABLE CHEST - 1 VIEW  Comparison: Chest x-ray 07/06/2012.  Findings: An endotracheal tube is in place with tip 3.9 cm above the carina. There is a right-sided subclavian central venous catheter with tip terminating in the distal superior vena cava. No pneumothorax.  Bibasilar opacities may reflect areas of atelectasis and/or consolidation.  Small bilateral pleural effusions.  No evidence of pulmonary edema.  Heart size is within normal limits. Upper mediastinal contours are unremarkable.  Atherosclerosis in the thoracic aorta.  IMPRESSION: 1.  Support apparatus, as above.  No pneumothorax following right subclavian central venous catheter placement. 2.  Persistent bibasilar atelectasis and/or consolidation with small bilateral pleural effusions. 3.  Atherosclerosis.   Original Report Authenticated By: Trudie Reed, M.D.   Dg Chest Port 1 View  07/06/2012   *RADIOLOGY REPORT*  Clinical Data: Status post intubation.  PORTABLE CHEST - 1 VIEW  Comparison: Chest x-ray 07/05/2012.  Findings: An endotracheal tube is in place with tip 3.1 cm above the carina. Lung volumes are normal.  Worsening bibasilar opacities, which may represent areas of atelectasis and/or consolidation, with superimposed moderate right and small left pleural effusions.  Pulmonary vasculature now appears within normal limits.  Heart size is borderline enlarged.  Mediastinal contours are unremarkable.  Atherosclerosis of the thoracic aorta.  IMPRESSION: 1.  Tip of endotracheal tube appears properly located 3.1 cm above the carina. 2.  Interval resolution of pulmonary edema. 3.   Worsening bibasilar aeration, which may reflect increasing areas of atelectasis and/or consolidation, with increasing moderate right and small left-sided pleural effusions. 4.  Atherosclerosis.   Original Report Authenticated  By: Trudie Reed, M.D.   Dg Abd Portable 1v  07/15/2012   *RADIOLOGY REPORT*  Clinical Data: NG tube placement.  PORTABLE ABDOMEN - 1 VIEW  Comparison: Chest x-ray 07/15/2012  Findings: Orogastric tube is in place, tip overlying the level of the duodenum, likely within the region of the bulb.  Bowel gas pattern is nonobstructive.  There are significant degenerative changes in the spine.  The patient has had previous right hip arthroplasty.  IMPRESSION: Orogastric tube tip overlying the level of the proximal duodenum.   Original Report Authenticated By: Norva Pavlov, M.D.   Dg Fluoro Guide Lumbar Puncture  07/06/2012   *RADIOLOGY REPORT*  Clinical Data:  Altered mental status.  DIAGNOSTIC LUMBAR PUNCTURE UNDER FLUOROSCOPIC GUIDANCE  Fluoroscopy time:  1 minute, 29 seconds.  Technique:  Informed consent was obtained from the patient prior to the procedure, including potential complications of headache, allergy, and pain.   With the patient prone, the lower back was prepped with Betadine.  1% Lidocaine was used for local anesthesia. Lumbar puncture was performed at the L3-4 level using a 20 gauge needle with return of clear CSF with an opening pressure of 10 cm water.   8 ml of CSF were obtained for laboratory studies.  The patient tolerated the procedure well and there were no apparent complications.  IMPRESSION: Technically successful fluoro guided lumbar puncture as above.   Original Report Authenticated By: Charlett Nose, M.D.    Microbiology: Recent Results (from the past 240 hour(s))  URINE CULTURE     Status: None   Collection Time    07/31/12  9:07 PM      Result Value Range Status   Specimen Description URINE, CLEAN CATCH   Final   Special Requests NONE   Final    Culture  Setup Time 07/31/2012 22:05   Final   Colony Count 30,000 COLONIES/ML   Final   Culture     Final   Value: Multiple bacterial morphotypes present, none predominant. Suggest appropriate recollection if clinically indicated.   Report Status 08/01/2012 FINAL   Final     Labs: Basic Metabolic Panel:  Recent Labs Lab 07/28/12 1307 07/29/12 0855 07/31/12 1711 08/01/12 0457 08/02/12 0605  NA 133* 136 126* 126* 135  K 4.6 3.3* 4.9 4.8 4.0  CL 94* 97 88* 86* 97  CO2 22 27 22 21 28   GLUCOSE 116* 117* 91 87 88  BUN 61* 20 47* 56* 17  CREATININE 5.65* 2.97* 4.74* 5.34* 2.38*  CALCIUM 7.9* 8.2* 8.4 8.4 8.2*  PHOS 6.0*  --   --  4.9* 3.0   Liver Function Tests:  Recent Labs Lab 07/28/12 1307 07/31/12 1711 08/01/12 0457 08/02/12 0605  AST  --  21  --   --   ALT  --  19  --   --   ALKPHOS  --  95  --   --   BILITOT  --  0.1*  --   --   PROT  --  6.2  --   --   ALBUMIN 2.4* 2.3* 2.3* 2.1*    Recent Labs Lab 07/31/12 1711  LIPASE 66*   No results found for this basename: AMMONIA,  in the last 168 hours CBC:  Recent Labs Lab 07/28/12 1307 07/29/12 0855 07/31/12 1711 08/01/12 0457 08/02/12 0605  WBC 16.3* 8.7 10.3 9.5 7.5  HGB 10.8* 10.7* 11.1* 10.2* 9.9*  HCT 32.9* 33.2* 33.4* 30.6* 29.7*  MCV 97.9 99.1 96.3 96.2 97.1  PLT  317 281 303 281 243   Cardiac Enzymes: No results found for this basename: CKTOTAL, CKMB, CKMBINDEX, TROPONINI,  in the last 168 hours BNP: BNP (last 3 results)  Recent Labs  07/04/12 0724  PROBNP 45870.0*   CBG: No results found for this basename: GLUCAP,  in the last 168 hours  Time coordinating discharge: 60 minutes  Signed:  Daxten Kovalenko  [facility] 08/02/2012, 12:00 PM

## 2012-08-02 NOTE — Progress Notes (Signed)
Speech Language Pathology Dysphagia Treatment Patient Details Name: Christine Graham MRN: [id number] DOB: January 20, 1926 Today's Date: 08/02/2012 Time: 8657-8469 SLP Time Calculation (min): 10 min  Assessment / Plan / Recommendation Clinical Impression  D/C pending.  Pt has been followed for dysphagia s/p intubation, laryngeal trauma, deconditioning.  Per ENT eval yesterday, arytenoid and vocal cord ulcerations are healing, likely having a concomitant effect on improved swallow.  Tolerating POs relatively well  - no overt s/s of aspiration today; swallow looks improved from two days ago.  Recommend continue Dysphagia 2 diet with thin liquids per family's wishes.   No further acute SLP needs.      Diet Recommendation  Continue with Current Diet: Dysphagia 2 (fine chop);Thin liquid    SLP Plan All goals met   Pertinent Vitals/Pain No pain   Swallowing Goals  SLP Swallowing Goals Patient will consume recommended diet without observed clinical signs of aspiration with: Minimal assistance Swallow Study Goal #1 - Progress: Met Patient will utilize recommended strategies during swallow to increase swallowing safety with: Minimal assistance Swallow Study Goal #2 - Progress: Met  General Temperature Spikes Noted: No Respiratory Status: Supplemental O2 delivered via (comment) Behavior/Cognition: Alert;Cooperative;Confused Oral Cavity - Dentition: Adequate natural dentition Patient Positioning: Upright in bed  Oral Cavity - Oral Hygiene Does patient have any of the following "at risk" factors?: Oxygen therapy - cannula, mask, simple oxygen devices Brush patient's teeth BID with toothbrush (using toothpaste with fluoride): Yes   Dysphagia Treatment Treatment focused on: Skilled observation of diet tolerance Family/Caregiver Educated: Significant other Treatment Methods/Modalities: Skilled observation Patient observed directly with PO's: Yes Type of PO's observed: Thin liquids Feeding: Needs  assist Liquids provided via: Cup;Straw Pharyngeal Phase Signs & Symptoms: Multiple swallows Type of cueing: Verbal Amount of cueing: Minimal   GO     Carolan Shiver 08/02/2012, 10:18 AM

## 2012-08-08 ENCOUNTER — Encounter: Payer: Self-pay | Admitting: Cardiovascular Disease

## 2012-08-08 DEATH — deceased

## 2012-10-20 ENCOUNTER — Encounter: Payer: Self-pay | Admitting: Cardiovascular Disease

## 2012-10-20 ENCOUNTER — Institutional Professional Consult (permissible substitution): Payer: Medicare Other | Admitting: Cardiovascular Disease

## 2012-10-20 ENCOUNTER — Ambulatory Visit (INDEPENDENT_AMBULATORY_CARE_PROVIDER_SITE_OTHER): Payer: Medicare Other | Admitting: Cardiovascular Disease

## 2012-10-20 VITALS — BP 177/54 | HR 62 | Wt 131.0 lb

## 2012-10-20 DIAGNOSIS — I509 Heart failure, unspecified: Secondary | ICD-10-CM

## 2012-10-20 DIAGNOSIS — E785 Hyperlipidemia, unspecified: Secondary | ICD-10-CM

## 2012-10-20 DIAGNOSIS — J9600 Acute respiratory failure, unspecified whether with hypoxia or hypercapnia: Secondary | ICD-10-CM

## 2012-10-20 DIAGNOSIS — I10 Essential (primary) hypertension: Secondary | ICD-10-CM

## 2012-10-20 DIAGNOSIS — I4891 Unspecified atrial fibrillation: Secondary | ICD-10-CM

## 2012-10-20 DIAGNOSIS — I129 Hypertensive chronic kidney disease with stage 1 through stage 4 chronic kidney disease, or unspecified chronic kidney disease: Secondary | ICD-10-CM

## 2012-10-20 NOTE — Assessment & Plan Note (Signed)
Pulmonary status improved post intubation.  Primary should reassess code status and long term goals

## 2012-10-20 NOTE — Assessment & Plan Note (Signed)
Maint NSR with no palpitations.  Not a good long term anticoagulation candidate due to low weigth and frailty

## 2012-10-20 NOTE — Patient Instructions (Signed)
Your physician recommends that you schedule a follow-up appointment in: AS NEEDED  Your physician recommends that you continue on your current medications as directed. Please refer to the Current Medication list given to you today.  

## 2012-10-20 NOTE — Progress Notes (Signed)
Patient ID: Christine Graham, female   DOB: 30-Aug-1925, 79 y.o.   MRN: [id number] Christine Graham is an 79 y/o F with history of ESRD, HTN, HLD, anemia of chronic disease and atrial arrhythmias.  Last seen at [facility] by Dr Teressa Lower  with SOB and nonproductive cough. CXR was concerning for bilateral infiltrates, PNA vs edema, WBC 14k. She was placed on antibiotic therapy with cefepime, Zosyn and vancomycin. She experienced a chance in mental status with progressive confusion/agitation leading to obtundation. Brain MRI 5/28 with old CVA's, nothing acute, EEG with epileptiform pattern and was treated with ativan and dilantin for non-convulsive seizure activity later deemed status epilepticus. She developed respiratory failure requiring intubation. It is felt at this point in time that cefepime is the culprit for her neurotoxicity.She was noted to go into a narrow complex tachycardia, rates 160's, SBP 94-132 systolic. EKG appears regular but she also had bouts of irregularity on telemetry raising suspicion for atrial fibrillation, with ST-T changes.   Echo 5/14 with normal EF and moderate MR    No palpitations or evidence of recurrent arrhythmia.  On low dose amiodarone.  At [facility] for rehab  Still difficult to ambulate.  Hopes to go home when she can walk  ROS: Denies fever, malais, weight loss, blurry vision, decreased visual acuity, cough, sputum, SOB, hemoptysis, pleuritic pain, palpitaitons, heartburn, abdominal pain, melena, lower extremity edema, claudication, or rash.  All other systems reviewed and negative  General: Affect appropriate Frail elderly female HEENT: normal Neck supple with no adenopathy JVP normal no bruits no thyromegaly Lungs clear with no wheezing and good diaphragmatic motion Heart:  S1/S2 no murmur, no rub, gallop or click PMI normal Abdomen: benighn, BS positve, no tenderness, no AAA no bruit.  No HSM or HJR Distal pulses intact with no bruits No edema Neuro  non-focal Skin warm and dry Bileateral LE weakness    Current Outpatient Prescriptions  Medication Sig Dispense Refill  . acetaminophen (TYLENOL) 325 MG tablet Take 650 mg by mouth every 6 (six) hours as needed. For pain/fever      . ALPRAZolam (XANAX) 0.25 MG tablet Take 1 tablet (0.25 mg total) by mouth 2 (two) times daily as needed. For anxiety  60 tablet  3  . amiodarone (PACERONE) 200 MG tablet Take 1 tablet (200 mg total) by mouth daily.      Marland Kitchen amLODipine (NORVASC) 10 MG tablet Take 1 tablet (10 mg total) by mouth daily.  90 tablet  0  . aspirin EC 81 MG tablet Take 81 mg by mouth daily.      . calcium acetate (PHOSLO) 667 MG capsule Take 1 capsule (667 mg total) by mouth 3 (three) times daily with meals.      . metoprolol tartrate (LOPRESSOR) 25 MG tablet Take 12.5 mg by mouth 2 (two) times daily.      . Multiple Vitamin (MULTIVITAMIN WITH MINERALS) TABS Take 1 tablet by mouth daily.      . Nutritional Supplements (FEEDING SUPPLEMENT, NEPRO CARB STEADY,) LIQD Take 237 mLs by mouth 2 (two) times daily between meals.    0  . omeprazole (PRILOSEC) 20 MG capsule Take 20 mg by mouth daily.      . pravastatin (PRAVACHOL) 80 MG tablet Take 80 mg by mouth daily.      . Vitamin D, Ergocalciferol, (DRISDOL) 50000 UNITS CAPS capsule Take 50,000 Units by mouth every 7 (seven) days.       No current facility-administered medications for this  visit.    Allergies  Cefepime; Codeine; and Oxycodone  Electrocardiogram:  NSR rate 62 LVH  Otherwise normal   Assessment and Plan

## 2012-10-20 NOTE — Assessment & Plan Note (Signed)
Cholesterol is at goal.  Continue current dose of statin and diet Rx.  No myalgias or side effects.  F/U  LFT's in 6 months. Lab Results  Component Value Date   LDLCALC 52 07/05/2012

## 2012-10-20 NOTE — Assessment & Plan Note (Signed)
F/U renal.  No signs of uremia No rub

## 2013-10-21 IMAGING — CR DG ABDOMEN 1V
2 series · 2 of 2 positions shown · non-contrast
Comparison: Abdomen film of 07/15/2012

CLINICAL DATA: Left lower quadrant abdominal pain

ABDOMEN - 1 VIEW

[x abdomen supine (1 of 2)]
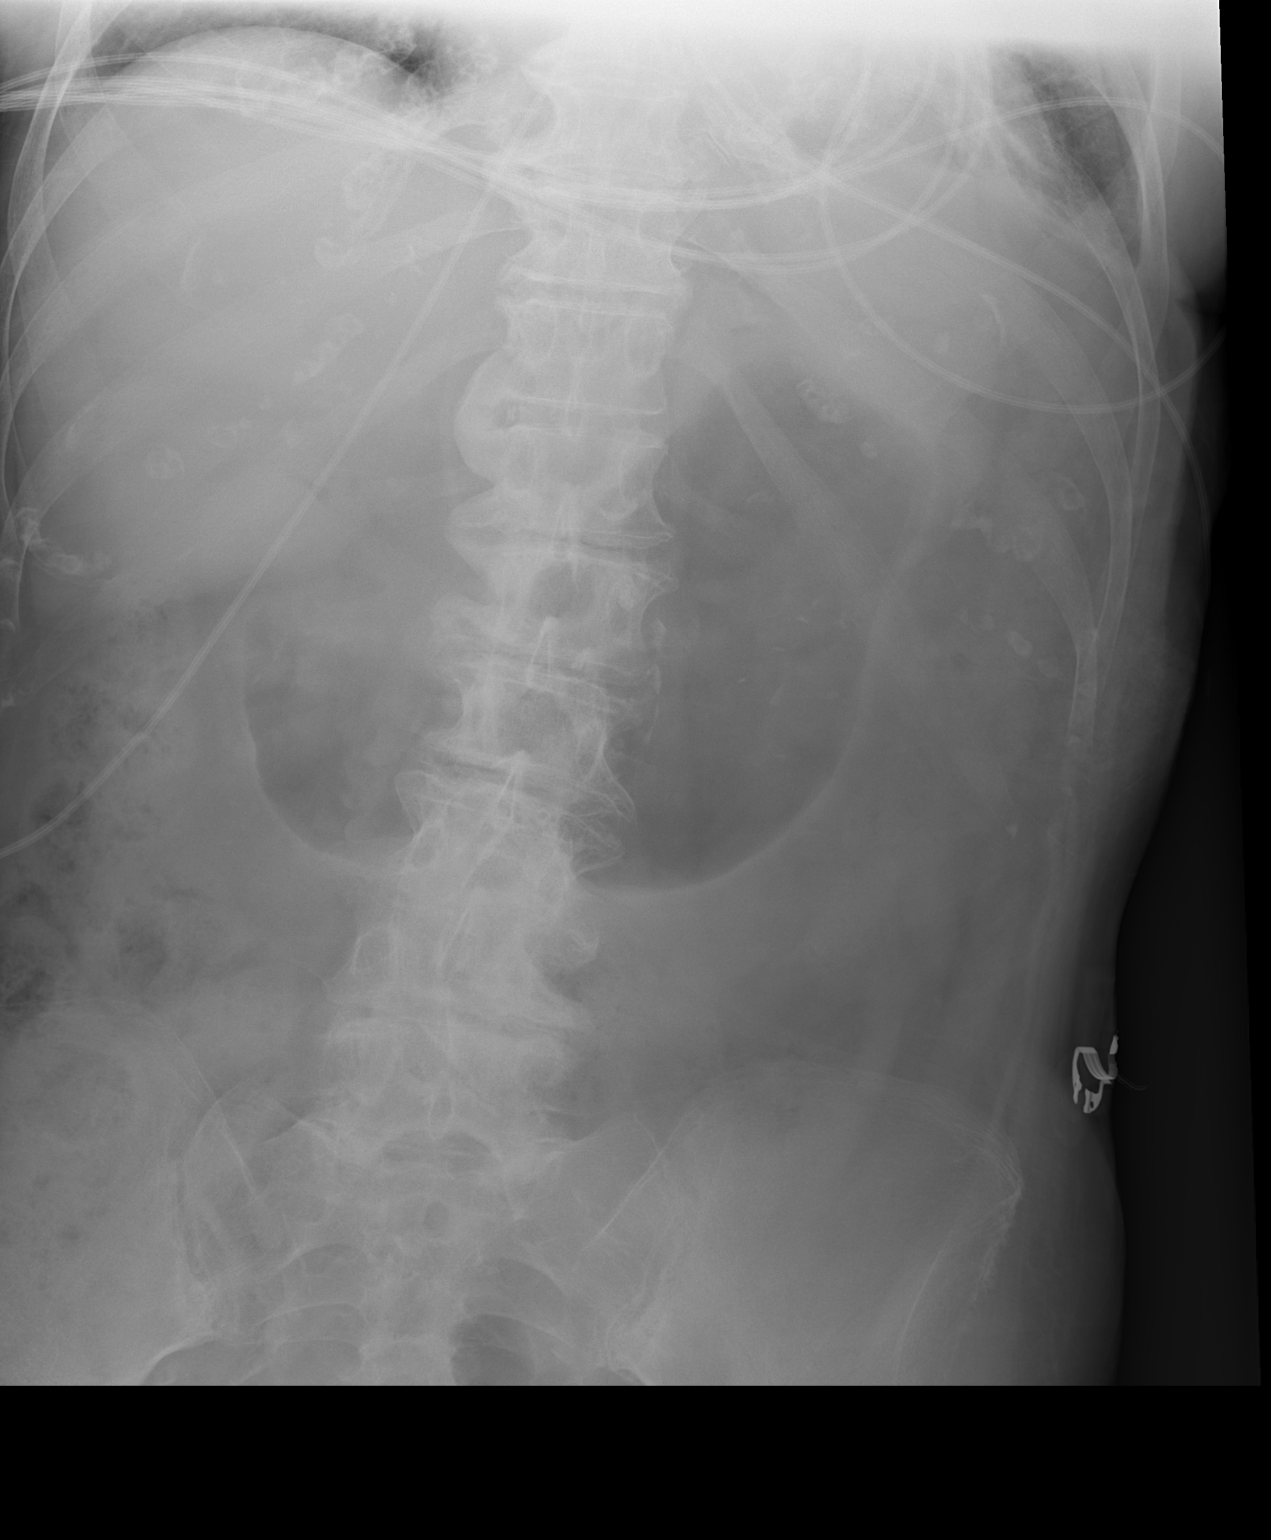

[x abdomen supine (2 of 2)]
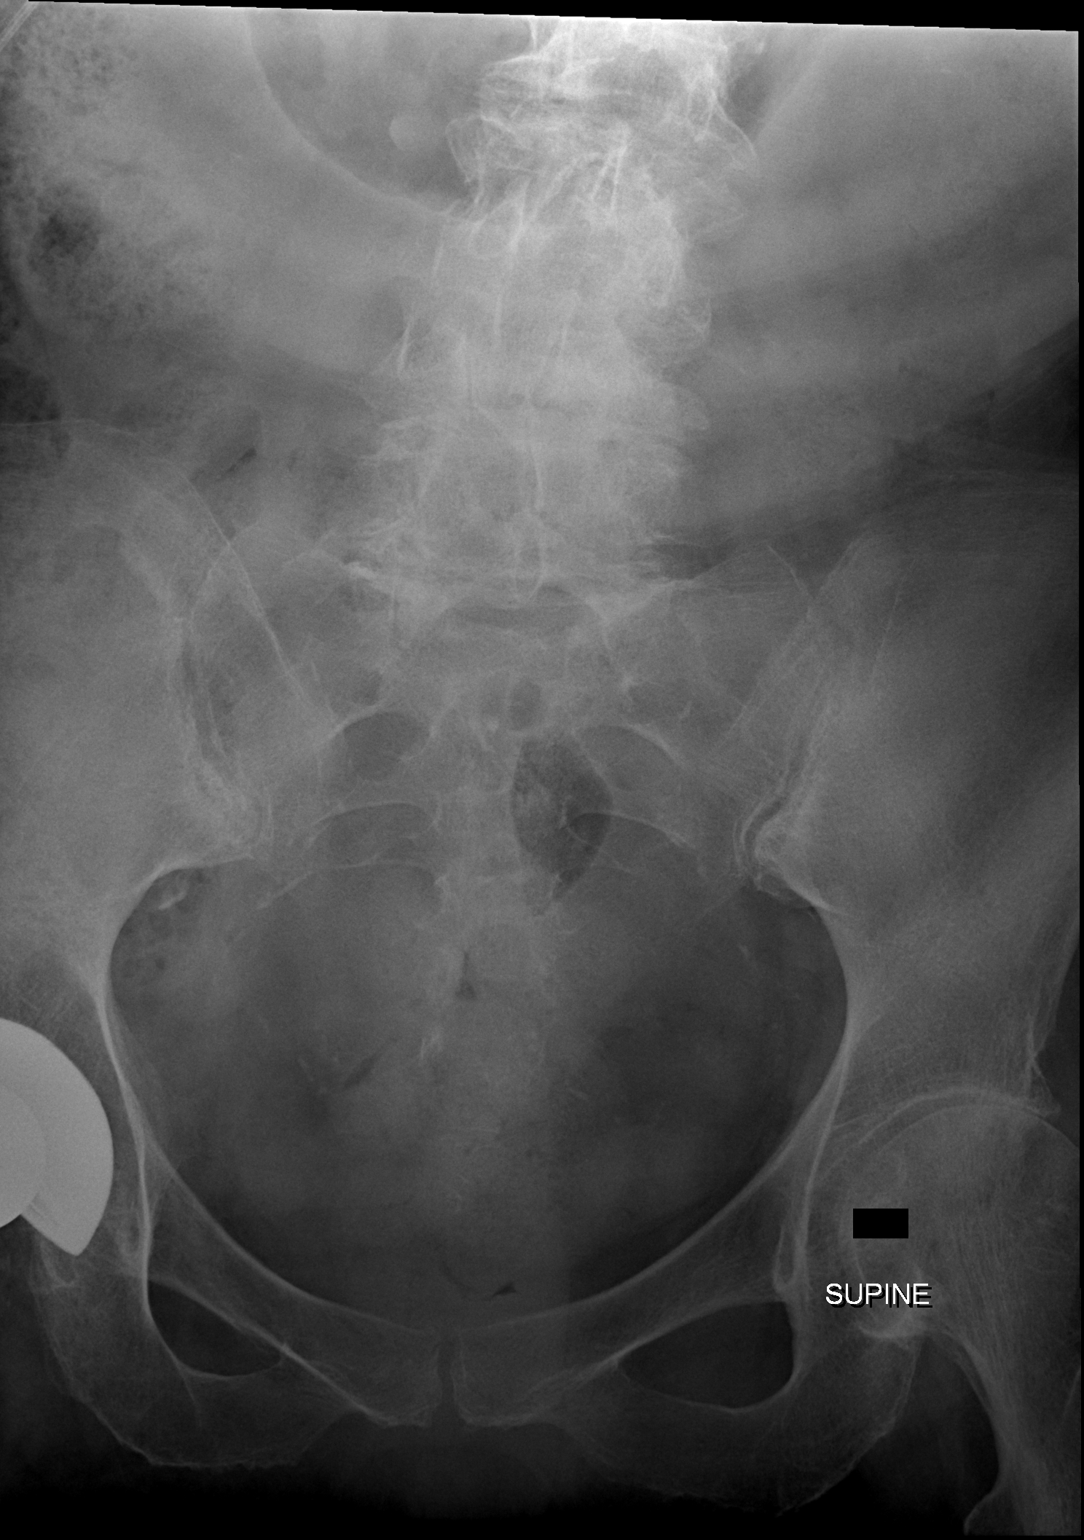

[2 of 2 positions shown; findings below may reference images not displayed]

FINDINGS: Supine views of the abdomen show no bowel obstruction.
There is very mild gaseous distention of the stomach.  There are
diffuse degenerative changes throughout the lumbar spine.  Right
total hip replacement is noted.
IMPRESSION: Nonspecific bowel gas pattern.  Diffuse degenerative change
throughout the lumbar spine

## 2014-02-10 DIAGNOSIS — D631 Anemia in chronic kidney disease: Secondary | ICD-10-CM | POA: Diagnosis not present

## 2014-02-10 DIAGNOSIS — N186 End stage renal disease: Secondary | ICD-10-CM | POA: Diagnosis not present

## 2014-02-10 DIAGNOSIS — D689 Coagulation defect, unspecified: Secondary | ICD-10-CM | POA: Diagnosis not present

## 2014-02-10 DIAGNOSIS — D509 Iron deficiency anemia, unspecified: Secondary | ICD-10-CM | POA: Diagnosis not present

## 2014-02-12 DIAGNOSIS — D509 Iron deficiency anemia, unspecified: Secondary | ICD-10-CM | POA: Diagnosis not present

## 2014-02-12 DIAGNOSIS — D631 Anemia in chronic kidney disease: Secondary | ICD-10-CM | POA: Diagnosis not present

## 2014-02-12 DIAGNOSIS — D689 Coagulation defect, unspecified: Secondary | ICD-10-CM | POA: Diagnosis not present

## 2014-02-12 DIAGNOSIS — N186 End stage renal disease: Secondary | ICD-10-CM | POA: Diagnosis not present

## 2014-02-14 DIAGNOSIS — D631 Anemia in chronic kidney disease: Secondary | ICD-10-CM | POA: Diagnosis not present

## 2014-02-14 DIAGNOSIS — D689 Coagulation defect, unspecified: Secondary | ICD-10-CM | POA: Diagnosis not present

## 2014-02-14 DIAGNOSIS — N186 End stage renal disease: Secondary | ICD-10-CM | POA: Diagnosis not present

## 2014-02-14 DIAGNOSIS — D509 Iron deficiency anemia, unspecified: Secondary | ICD-10-CM | POA: Diagnosis not present

## 2014-02-16 DIAGNOSIS — D509 Iron deficiency anemia, unspecified: Secondary | ICD-10-CM | POA: Diagnosis not present

## 2014-02-16 DIAGNOSIS — N186 End stage renal disease: Secondary | ICD-10-CM | POA: Diagnosis not present

## 2014-02-16 DIAGNOSIS — D689 Coagulation defect, unspecified: Secondary | ICD-10-CM | POA: Diagnosis not present

## 2014-02-16 DIAGNOSIS — D631 Anemia in chronic kidney disease: Secondary | ICD-10-CM | POA: Diagnosis not present

## 2014-02-19 DIAGNOSIS — D631 Anemia in chronic kidney disease: Secondary | ICD-10-CM | POA: Diagnosis not present

## 2014-02-19 DIAGNOSIS — D509 Iron deficiency anemia, unspecified: Secondary | ICD-10-CM | POA: Diagnosis not present

## 2014-02-19 DIAGNOSIS — D689 Coagulation defect, unspecified: Secondary | ICD-10-CM | POA: Diagnosis not present

## 2014-02-19 DIAGNOSIS — N186 End stage renal disease: Secondary | ICD-10-CM | POA: Diagnosis not present

## 2014-02-21 DIAGNOSIS — D631 Anemia in chronic kidney disease: Secondary | ICD-10-CM | POA: Diagnosis not present

## 2014-02-21 DIAGNOSIS — D509 Iron deficiency anemia, unspecified: Secondary | ICD-10-CM | POA: Diagnosis not present

## 2014-02-21 DIAGNOSIS — D689 Coagulation defect, unspecified: Secondary | ICD-10-CM | POA: Diagnosis not present

## 2014-02-21 DIAGNOSIS — N186 End stage renal disease: Secondary | ICD-10-CM | POA: Diagnosis not present

## 2014-02-22 DIAGNOSIS — Z992 Dependence on renal dialysis: Secondary | ICD-10-CM | POA: Diagnosis not present

## 2014-02-22 DIAGNOSIS — I871 Compression of vein: Secondary | ICD-10-CM | POA: Diagnosis not present

## 2014-02-22 DIAGNOSIS — T82858D Stenosis of vascular prosthetic devices, implants and grafts, subsequent encounter: Secondary | ICD-10-CM | POA: Diagnosis not present

## 2014-02-22 DIAGNOSIS — N186 End stage renal disease: Secondary | ICD-10-CM | POA: Diagnosis not present

## 2014-02-23 DIAGNOSIS — D689 Coagulation defect, unspecified: Secondary | ICD-10-CM | POA: Diagnosis not present

## 2014-02-23 DIAGNOSIS — D631 Anemia in chronic kidney disease: Secondary | ICD-10-CM | POA: Diagnosis not present

## 2014-02-23 DIAGNOSIS — D509 Iron deficiency anemia, unspecified: Secondary | ICD-10-CM | POA: Diagnosis not present

## 2014-02-23 DIAGNOSIS — N186 End stage renal disease: Secondary | ICD-10-CM | POA: Diagnosis not present

## 2014-02-26 DIAGNOSIS — D631 Anemia in chronic kidney disease: Secondary | ICD-10-CM | POA: Diagnosis not present

## 2014-02-26 DIAGNOSIS — D509 Iron deficiency anemia, unspecified: Secondary | ICD-10-CM | POA: Diagnosis not present

## 2014-02-26 DIAGNOSIS — D689 Coagulation defect, unspecified: Secondary | ICD-10-CM | POA: Diagnosis not present

## 2014-02-26 DIAGNOSIS — N186 End stage renal disease: Secondary | ICD-10-CM | POA: Diagnosis not present

## 2014-02-28 DIAGNOSIS — D689 Coagulation defect, unspecified: Secondary | ICD-10-CM | POA: Diagnosis not present

## 2014-02-28 DIAGNOSIS — I509 Heart failure, unspecified: Secondary | ICD-10-CM | POA: Diagnosis not present

## 2014-02-28 DIAGNOSIS — R569 Unspecified convulsions: Secondary | ICD-10-CM | POA: Diagnosis not present

## 2014-02-28 DIAGNOSIS — I10 Essential (primary) hypertension: Secondary | ICD-10-CM | POA: Diagnosis not present

## 2014-02-28 DIAGNOSIS — N186 End stage renal disease: Secondary | ICD-10-CM | POA: Diagnosis not present

## 2014-02-28 DIAGNOSIS — D509 Iron deficiency anemia, unspecified: Secondary | ICD-10-CM | POA: Diagnosis not present

## 2014-02-28 DIAGNOSIS — D631 Anemia in chronic kidney disease: Secondary | ICD-10-CM | POA: Diagnosis not present

## 2014-02-28 DIAGNOSIS — F0390 Unspecified dementia without behavioral disturbance: Secondary | ICD-10-CM | POA: Diagnosis not present

## 2014-03-02 DIAGNOSIS — N186 End stage renal disease: Secondary | ICD-10-CM | POA: Diagnosis not present

## 2014-03-02 DIAGNOSIS — D509 Iron deficiency anemia, unspecified: Secondary | ICD-10-CM | POA: Diagnosis not present

## 2014-03-02 DIAGNOSIS — D689 Coagulation defect, unspecified: Secondary | ICD-10-CM | POA: Diagnosis not present

## 2014-03-02 DIAGNOSIS — D631 Anemia in chronic kidney disease: Secondary | ICD-10-CM | POA: Diagnosis not present

## 2014-03-05 DIAGNOSIS — D509 Iron deficiency anemia, unspecified: Secondary | ICD-10-CM | POA: Diagnosis not present

## 2014-03-05 DIAGNOSIS — D631 Anemia in chronic kidney disease: Secondary | ICD-10-CM | POA: Diagnosis not present

## 2014-03-05 DIAGNOSIS — D689 Coagulation defect, unspecified: Secondary | ICD-10-CM | POA: Diagnosis not present

## 2014-03-05 DIAGNOSIS — N186 End stage renal disease: Secondary | ICD-10-CM | POA: Diagnosis not present

## 2014-03-07 DIAGNOSIS — N186 End stage renal disease: Secondary | ICD-10-CM | POA: Diagnosis not present

## 2014-03-07 DIAGNOSIS — D631 Anemia in chronic kidney disease: Secondary | ICD-10-CM | POA: Diagnosis not present

## 2014-03-07 DIAGNOSIS — D689 Coagulation defect, unspecified: Secondary | ICD-10-CM | POA: Diagnosis not present

## 2014-03-07 DIAGNOSIS — D509 Iron deficiency anemia, unspecified: Secondary | ICD-10-CM | POA: Diagnosis not present

## 2014-03-09 DIAGNOSIS — D631 Anemia in chronic kidney disease: Secondary | ICD-10-CM | POA: Diagnosis not present

## 2014-03-09 DIAGNOSIS — D689 Coagulation defect, unspecified: Secondary | ICD-10-CM | POA: Diagnosis not present

## 2014-03-09 DIAGNOSIS — N186 End stage renal disease: Secondary | ICD-10-CM | POA: Diagnosis not present

## 2014-03-09 DIAGNOSIS — D509 Iron deficiency anemia, unspecified: Secondary | ICD-10-CM | POA: Diagnosis not present

## 2014-03-10 DIAGNOSIS — Z992 Dependence on renal dialysis: Secondary | ICD-10-CM | POA: Diagnosis not present

## 2014-03-10 DIAGNOSIS — N186 End stage renal disease: Secondary | ICD-10-CM | POA: Diagnosis not present

## 2014-03-12 DIAGNOSIS — D509 Iron deficiency anemia, unspecified: Secondary | ICD-10-CM | POA: Diagnosis not present

## 2014-03-12 DIAGNOSIS — N186 End stage renal disease: Secondary | ICD-10-CM | POA: Diagnosis not present

## 2014-03-12 DIAGNOSIS — D631 Anemia in chronic kidney disease: Secondary | ICD-10-CM | POA: Diagnosis not present

## 2014-03-12 DIAGNOSIS — D689 Coagulation defect, unspecified: Secondary | ICD-10-CM | POA: Diagnosis not present

## 2014-03-14 DIAGNOSIS — D631 Anemia in chronic kidney disease: Secondary | ICD-10-CM | POA: Diagnosis not present

## 2014-03-14 DIAGNOSIS — N186 End stage renal disease: Secondary | ICD-10-CM | POA: Diagnosis not present

## 2014-03-14 DIAGNOSIS — D509 Iron deficiency anemia, unspecified: Secondary | ICD-10-CM | POA: Diagnosis not present

## 2014-03-14 DIAGNOSIS — D689 Coagulation defect, unspecified: Secondary | ICD-10-CM | POA: Diagnosis not present

## 2014-03-16 DIAGNOSIS — D631 Anemia in chronic kidney disease: Secondary | ICD-10-CM | POA: Diagnosis not present

## 2014-03-16 DIAGNOSIS — N186 End stage renal disease: Secondary | ICD-10-CM | POA: Diagnosis not present

## 2014-03-16 DIAGNOSIS — D509 Iron deficiency anemia, unspecified: Secondary | ICD-10-CM | POA: Diagnosis not present

## 2014-03-16 DIAGNOSIS — D689 Coagulation defect, unspecified: Secondary | ICD-10-CM | POA: Diagnosis not present

## 2014-03-19 DIAGNOSIS — D631 Anemia in chronic kidney disease: Secondary | ICD-10-CM | POA: Diagnosis not present

## 2014-03-19 DIAGNOSIS — D689 Coagulation defect, unspecified: Secondary | ICD-10-CM | POA: Diagnosis not present

## 2014-03-19 DIAGNOSIS — N186 End stage renal disease: Secondary | ICD-10-CM | POA: Diagnosis not present

## 2014-03-19 DIAGNOSIS — D509 Iron deficiency anemia, unspecified: Secondary | ICD-10-CM | POA: Diagnosis not present

## 2014-03-21 DIAGNOSIS — D689 Coagulation defect, unspecified: Secondary | ICD-10-CM | POA: Diagnosis not present

## 2014-03-21 DIAGNOSIS — D631 Anemia in chronic kidney disease: Secondary | ICD-10-CM | POA: Diagnosis not present

## 2014-03-21 DIAGNOSIS — D509 Iron deficiency anemia, unspecified: Secondary | ICD-10-CM | POA: Diagnosis not present

## 2014-03-21 DIAGNOSIS — N186 End stage renal disease: Secondary | ICD-10-CM | POA: Diagnosis not present

## 2014-03-21 DIAGNOSIS — R634 Abnormal weight loss: Secondary | ICD-10-CM | POA: Diagnosis not present

## 2014-03-21 DIAGNOSIS — F329 Major depressive disorder, single episode, unspecified: Secondary | ICD-10-CM | POA: Diagnosis not present

## 2014-03-23 DIAGNOSIS — D689 Coagulation defect, unspecified: Secondary | ICD-10-CM | POA: Diagnosis not present

## 2014-03-23 DIAGNOSIS — D509 Iron deficiency anemia, unspecified: Secondary | ICD-10-CM | POA: Diagnosis not present

## 2014-03-23 DIAGNOSIS — D631 Anemia in chronic kidney disease: Secondary | ICD-10-CM | POA: Diagnosis not present

## 2014-03-23 DIAGNOSIS — N186 End stage renal disease: Secondary | ICD-10-CM | POA: Diagnosis not present

## 2014-03-26 DIAGNOSIS — D689 Coagulation defect, unspecified: Secondary | ICD-10-CM | POA: Diagnosis not present

## 2014-03-26 DIAGNOSIS — D631 Anemia in chronic kidney disease: Secondary | ICD-10-CM | POA: Diagnosis not present

## 2014-03-26 DIAGNOSIS — D509 Iron deficiency anemia, unspecified: Secondary | ICD-10-CM | POA: Diagnosis not present

## 2014-03-26 DIAGNOSIS — N186 End stage renal disease: Secondary | ICD-10-CM | POA: Diagnosis not present

## 2014-03-28 DIAGNOSIS — D509 Iron deficiency anemia, unspecified: Secondary | ICD-10-CM | POA: Diagnosis not present

## 2014-03-28 DIAGNOSIS — N186 End stage renal disease: Secondary | ICD-10-CM | POA: Diagnosis not present

## 2014-03-28 DIAGNOSIS — D631 Anemia in chronic kidney disease: Secondary | ICD-10-CM | POA: Diagnosis not present

## 2014-03-28 DIAGNOSIS — D689 Coagulation defect, unspecified: Secondary | ICD-10-CM | POA: Diagnosis not present

## 2014-03-30 DIAGNOSIS — D631 Anemia in chronic kidney disease: Secondary | ICD-10-CM | POA: Diagnosis not present

## 2014-03-30 DIAGNOSIS — N186 End stage renal disease: Secondary | ICD-10-CM | POA: Diagnosis not present

## 2014-03-30 DIAGNOSIS — D509 Iron deficiency anemia, unspecified: Secondary | ICD-10-CM | POA: Diagnosis not present

## 2014-03-30 DIAGNOSIS — D689 Coagulation defect, unspecified: Secondary | ICD-10-CM | POA: Diagnosis not present

## 2014-04-02 DIAGNOSIS — D509 Iron deficiency anemia, unspecified: Secondary | ICD-10-CM | POA: Diagnosis not present

## 2014-04-02 DIAGNOSIS — N186 End stage renal disease: Secondary | ICD-10-CM | POA: Diagnosis not present

## 2014-04-02 DIAGNOSIS — D631 Anemia in chronic kidney disease: Secondary | ICD-10-CM | POA: Diagnosis not present

## 2014-04-02 DIAGNOSIS — D689 Coagulation defect, unspecified: Secondary | ICD-10-CM | POA: Diagnosis not present

## 2014-04-04 DIAGNOSIS — D509 Iron deficiency anemia, unspecified: Secondary | ICD-10-CM | POA: Diagnosis not present

## 2014-04-04 DIAGNOSIS — N186 End stage renal disease: Secondary | ICD-10-CM | POA: Diagnosis not present

## 2014-04-04 DIAGNOSIS — D631 Anemia in chronic kidney disease: Secondary | ICD-10-CM | POA: Diagnosis not present

## 2014-04-04 DIAGNOSIS — D689 Coagulation defect, unspecified: Secondary | ICD-10-CM | POA: Diagnosis not present

## 2014-04-06 DIAGNOSIS — N186 End stage renal disease: Secondary | ICD-10-CM | POA: Diagnosis not present

## 2014-04-06 DIAGNOSIS — D631 Anemia in chronic kidney disease: Secondary | ICD-10-CM | POA: Diagnosis not present

## 2014-04-06 DIAGNOSIS — D689 Coagulation defect, unspecified: Secondary | ICD-10-CM | POA: Diagnosis not present

## 2014-04-06 DIAGNOSIS — D509 Iron deficiency anemia, unspecified: Secondary | ICD-10-CM | POA: Diagnosis not present

## 2014-04-08 ENCOUNTER — Emergency Department (HOSPITAL_COMMUNITY)
Admission: EM | Admit: 2014-04-08 | Discharge: 2014-04-08 | Disposition: A | Discharge status: Home / Self Care | Payer: Medicare Other | Attending: Emergency Medicine | Admitting: Emergency Medicine

## 2014-04-08 ENCOUNTER — Emergency Department (HOSPITAL_COMMUNITY): Payer: Medicare Other

## 2014-04-08 ENCOUNTER — Encounter (HOSPITAL_COMMUNITY): Payer: Self-pay | Admitting: *Deleted

## 2014-04-08 DIAGNOSIS — Z7982 Long term (current) use of aspirin: Secondary | ICD-10-CM | POA: Diagnosis not present

## 2014-04-08 DIAGNOSIS — I120 Hypertensive chronic kidney disease with stage 5 chronic kidney disease or end stage renal disease: Secondary | ICD-10-CM | POA: Insufficient documentation

## 2014-04-08 DIAGNOSIS — F0390 Unspecified dementia without behavioral disturbance: Secondary | ICD-10-CM | POA: Diagnosis not present

## 2014-04-08 DIAGNOSIS — N186 End stage renal disease: Secondary | ICD-10-CM

## 2014-04-08 DIAGNOSIS — Y92128 Other place in nursing home as the place of occurrence of the external cause: Secondary | ICD-10-CM | POA: Diagnosis not present

## 2014-04-08 DIAGNOSIS — S0083XA Contusion of other part of head, initial encounter: Secondary | ICD-10-CM | POA: Diagnosis not present

## 2014-04-08 DIAGNOSIS — E785 Hyperlipidemia, unspecified: Secondary | ICD-10-CM | POA: Insufficient documentation

## 2014-04-08 DIAGNOSIS — D649 Anemia, unspecified: Secondary | ICD-10-CM | POA: Insufficient documentation

## 2014-04-08 DIAGNOSIS — S31502A Unspecified open wound of unspecified external genital organs, female, initial encounter: Secondary | ICD-10-CM | POA: Insufficient documentation

## 2014-04-08 DIAGNOSIS — Y998 Other external cause status: Secondary | ICD-10-CM | POA: Insufficient documentation

## 2014-04-08 DIAGNOSIS — W010XXA Fall on same level from slipping, tripping and stumbling without subsequent striking against object, initial encounter: Secondary | ICD-10-CM | POA: Insufficient documentation

## 2014-04-08 DIAGNOSIS — S31512A Laceration without foreign body of unspecified external genital organs, female, initial encounter: Secondary | ICD-10-CM | POA: Diagnosis not present

## 2014-04-08 DIAGNOSIS — Z992 Dependence on renal dialysis: Secondary | ICD-10-CM

## 2014-04-08 DIAGNOSIS — Z8739 Personal history of other diseases of the musculoskeletal system and connective tissue: Secondary | ICD-10-CM | POA: Diagnosis not present

## 2014-04-08 DIAGNOSIS — Z79899 Other long term (current) drug therapy: Secondary | ICD-10-CM | POA: Diagnosis not present

## 2014-04-08 DIAGNOSIS — Y9389 Activity, other specified: Secondary | ICD-10-CM | POA: Diagnosis not present

## 2014-04-08 DIAGNOSIS — S064X0A Epidural hemorrhage without loss of consciousness, initial encounter: Secondary | ICD-10-CM | POA: Diagnosis not present

## 2014-04-08 DIAGNOSIS — S0093XA Contusion of unspecified part of head, initial encounter: Secondary | ICD-10-CM | POA: Diagnosis not present

## 2014-04-08 DIAGNOSIS — S0990XA Unspecified injury of head, initial encounter: Secondary | ICD-10-CM | POA: Diagnosis not present

## 2014-04-08 DIAGNOSIS — S199XXA Unspecified injury of neck, initial encounter: Secondary | ICD-10-CM | POA: Diagnosis not present

## 2014-04-08 DIAGNOSIS — S0003XA Contusion of scalp, initial encounter: Secondary | ICD-10-CM | POA: Diagnosis not present

## 2014-04-08 DIAGNOSIS — I10 Essential (primary) hypertension: Secondary | ICD-10-CM

## 2014-04-08 MED ORDER — AMLODIPINE BESYLATE 5 MG PO TABS
10.0000 mg | ORAL_TABLET | Freq: Once | ORAL | Status: AC
  Administered 2014-04-08: 10 mg via ORAL
  Filled 2014-04-08: qty 2

## 2014-04-08 MED ORDER — METOPROLOL TARTRATE 25 MG PO TABS
25.0000 mg | ORAL_TABLET | Freq: Once | ORAL | Status: AC
  Administered 2014-04-08: 25 mg via ORAL
  Filled 2014-04-08: qty 1

## 2014-04-08 MED ORDER — LISINOPRIL 20 MG PO TABS
40.0000 mg | ORAL_TABLET | Freq: Once | ORAL | Status: AC
  Administered 2014-04-08: 40 mg via ORAL
  Filled 2014-04-08: qty 2

## 2014-04-08 NOTE — ED Notes (Signed)
Cleaned pt's face and placed steri strips on wound over right eye

## 2014-04-08 NOTE — ED Notes (Signed)
Pt placed in a gown and hooked up to the monitor with 5 lead, BP cuff and pulse ox 

## 2014-04-08 NOTE — ED Provider Notes (Addendum)
CSN: [id number]     Arrival date & time 04/08/14  4098 History   First MD Initiated Contact with Patient 04/08/14 [phone]     Chief Complaint  Patient presents with  . Fall     (Consider location/radiation/quality/duration/timing/severity/associated sxs/prior Treatment) Patient is a 81 y.o. female presenting with fall. The history is provided by the patient, the EMS personnel and the nursing home. The history is limited by the condition of the patient (Dementia).  Fall  She was found on the floor at the nursing home where she resides. She was found during normal, morning rounds. She states that she got up to go the bathroom and lost her balance and fell. She denies loss of consciousness. She did suffer a laceration to her forehead. She is up-to-date on tetanus immunizations. Nursing home personnel state the patient is at her normal, baseline mental status.  Past Medical History  Diagnosis Date  . Hyperlipidemia   . Hypertension   . Gout   . ESRD (end stage renal disease)     on HD.  Marland Kitchen Anemia    Past Surgical History  Procedure Laterality Date  . Appendectomy    . Cholecystectomy    . Total hip arthroplasty    . Breast surgery      benign breast biopsy  . Av fistula placement  10/16/2010    (R) UA AVF  . Hip closed reduction  07/21/2011    Procedure: CLOSED REDUCTION HIP;  Surgeon: Toni Arthurs, MD;  Location: [facility] OR;  Service: Orthopedics;  Laterality: Right;   Family History  Problem Relation Age of Onset  . Heart disease Father     MI  . Arthritis Mother   . Stroke Mother    History  Substance Use Topics  . Smoking status: Never Smoker   . Smokeless tobacco: Never Used  . Alcohol Use: No   OB History    No data available     Review of Systems  Unable to perform ROS: Dementia      Allergies  Cefepime; Codeine; and Oxycodone  Home Medications   Prior to Admission medications   Medication Sig Start Date End Date Taking? Authorizing Provider  acetaminophen  (TYLENOL) 325 MG tablet Take 650 mg by mouth every 6 (six) hours as needed. For pain/fever    Historical Provider, MD  amiodarone (PACERONE) 200 MG tablet Take 1 tablet (200 mg total) by mouth daily. 08/01/12   Renae Fickle, MD  amLODipine (NORVASC) 10 MG tablet Take 1 tablet (10 mg total) by mouth daily. 03/29/12   Lindley Magnus, MD  aspirin EC 81 MG tablet Take 81 mg by mouth daily.    Historical Provider, MD  calcium acetate (PHOSLO) 667 MG capsule Take 1 capsule (667 mg total) by mouth 3 (three) times daily with meals. 08/01/12   Renae Fickle, MD  metoprolol tartrate (LOPRESSOR) 25 MG tablet Take 12.5 mg by mouth 2 (two) times daily.    Historical Provider, MD  Multiple Vitamin (MULTIVITAMIN WITH MINERALS) TABS Take 1 tablet by mouth daily.    Historical Provider, MD  Nutritional Supplements (FEEDING SUPPLEMENT, NEPRO CARB STEADY,) LIQD Take 237 mLs by mouth 2 (two) times daily between meals. 08/01/12   Renae Fickle, MD  omeprazole (PRILOSEC) 20 MG capsule Take 20 mg by mouth daily.    Historical Provider, MD  pravastatin (PRAVACHOL) 80 MG tablet Take 80 mg by mouth daily.    Historical Provider, MD  Vitamin D, Ergocalciferol, (DRISDOL) 50000 UNITS CAPS  capsule Take 50,000 Units by mouth every 7 (seven) days.    Historical Provider, MD   BP 230/96 mmHg  Pulse 70  Temp(Src) 97.7 F (36.5 C) (Oral)  Resp 18  SpO2 100% Physical Exam  Nursing note and vitals reviewed.  81 year old female, resting comfortably and in no acute distress. Vital signs are significant for hypertension. Oxygen saturation is 100%, which is normal. Head is normocephalic. Large hematomas present in the central part of the forehead with skin tear present but no discrete laceration. PERRLA, EOMI. Oropharynx is clear. Neck is nontender without adenopathy or JVD. Back is nontender and there is no CVA tenderness. Lungs are clear without rales, wheezes, or rhonchi. Chest is nontender. Heart has regular rate and  rhythm without murmur. Abdomen is soft, flat, nontender without masses or hepatosplenomegaly and peristalsis is normoactive. Extremities have no cyanosis or edema, full range of motion is present. Skin is warm and dry without rash. Neurologic: He is awake and alert and oriented to person but not place or time, cranial nerves are intact, there are no motor or sensory deficits.  ED Course  Procedures (including critical care time)  MDM   Final diagnoses:  Fall from slip, trip, or stumble, initial encounter  Forehead contusion, initial encounter  Skin tear of female genital tract, initial encounter  Essential hypertension  End-stage renal disease on hemodialysis    Fall at nursing home with 4 head hematoma. Review of patient's medications shows that she is not on any anticoagulants other than low-dose aspirin. She will be sent for CT of head and cervical spine. Review of past records confirms tetanus immunization in 2012. Blood pressure is significantly elevated and will be observed in the ED. She'll need to get her morning medications. Case is signed out to Dr. Rosalia Hammersay to evaluate results of CT scans.    Dione Boozeavid Sharnese Heath, MD 04/08/14 [phone]  Patient's son has come to the ED and has informed me that she has been refusing her medications for quite some time. She is completely bedridden and unable to stand without assistance. Management of her elevated blood pressure was discussed. We will give her her routine hypertensive medications in the ED and he is encouraged to get her to be compliant with her medication regimen. There is no indication for emergent lowering of blood pressure.  Dione Boozeavid Xzander Gilham, MD 04/08/14 [phone]

## 2014-04-08 NOTE — ED Notes (Signed)
Pt. Is from [facility] and was found by staff during their last round. Fall was unwitnessed. Pt. Has a hematoma to the forehead.

## 2014-04-08 NOTE — Discharge Instructions (Signed)
There is a nodule seen on the chest CT. If there is concern for malignancy and this should be followed up with her primary care doctor. We are placing Steri-Strips on the wound. These will fall off eventually did not need to be removed. She is hypertensive and has received some of her blood pressure medicines here this morning. Please check and give her the ones that have not been given here.

## 2014-04-08 NOTE — ED Provider Notes (Addendum)
Ct Head Wo Contrast  04/08/2014   CLINICAL DATA:  Head trauma after falling getting out of bed this morning. Right frontal scalp hematoma.  EXAM: CT HEAD WITHOUT CONTRAST  TECHNIQUE: Contiguous axial images were obtained from the base of the skull through the vertex without intravenous contrast.  COMPARISON:  07/08/2012  FINDINGS: There is no acute intracranial hemorrhage, acute infarction, or intracranial mass lesion.  There is an old right posterior inferior cerebellar artery distribution infarct with secondary encephalomalacia. There is diffuse cerebral cortical atrophy with secondary ventricular dilatation, increased since the prior exam. There is increased diffuse periventricular white matter lucency consistent with chronic small vessel ischemic disease.  There is a prominent right frontal scalp hematoma. No underlying osseous abnormality.  IMPRESSION: Prominent right frontal scalp hematoma. No acute intracranial abnormality. Diffuse atrophy with chronic small vessel ischemic disease.  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   Electronically Signed   By: Francene BoyersJames  Maxwell M.D.   On: 04/08/2014 07:58   Ct Cervical Spine Wo Contrast  04/08/2014   CLINICAL DATA:  Larey SeatFell this morning while getting out of bed at nursing home, RIGHT frontal hematoma, initial encounter, history hypertension, end-stage renal disease  EXAM: CT CERVICAL SPINE WITHOUT CONTRAST  TECHNIQUE: Multidetector CT imaging of the cervical spine was performed without intravenous contrast. Multiplanar CT image reconstructions were also generated.  COMPARISON:  None  FINDINGS: Visualized skullbase intact.  Prevertebral soft tissues normal thickness.  Bones appear demineralized.  Multilevel disc space narrowing and endplate spur formation, with AP narrowing of the spinal canal by posterior endplate spurs at Z6-X0C5-C6 greater on LEFT.  Multilevel facet degenerative changes bilaterally.  Encroachment upon cervical neural foramina by uncovertebral spurs greatest at LEFT C4-C5  and C5-C6.  Minimal anterolisthesis at C7-T1 likely due to degenerative disc and facet disease.  Vertebral body heights maintained without fracture or additional subluxation.  Extensive atherosclerotic calcification within the carotid systems bilaterally.  12 x 9 mm slightly irregular nodular opacity at LEFT upper lobe image 86.  Small RIGHT pleural effusion.  BILATERAL small thyroid nodules.  IMPRESSION: Multilevel degenerative disc and facet disease changes of the cervical spine with mild anterolisthesis at C7-T1, likely degenerative.  No acute fracture or additional subluxation.  12 x 9 mm irregular nodular density LEFT upper lobe, cannot exclude malignancy.  Small RIGHT pleural effusion.   Electronically Signed   By: Ulyses SouthwardMark  Boles M.D.   On: 04/08/2014 08:05    Patient care assumed from Dr. Preston FleetingGlick with need for follow up of [facility].  I discussed the acute findings on the head CT and the cervical spine with the patient's son. There are no acute abnormalities from the fall. He was informed of the irregular nodule density in the left upper lobe. She is a DO NOT RESUSCITATE. The wound are to be cleaned. She'll be discharged back to the nursing home.  Hilario Quarryanielle S Kolton Kienle, MD 04/09/14 [id number]  Hilario Quarryanielle S Nelsy Madonna, MD 04/09/14 [phone]

## 2014-04-08 NOTE — ED Notes (Signed)
[facility] called to transfer pt back to facility

## 2014-04-09 DIAGNOSIS — D631 Anemia in chronic kidney disease: Secondary | ICD-10-CM | POA: Diagnosis not present

## 2014-04-09 DIAGNOSIS — D509 Iron deficiency anemia, unspecified: Secondary | ICD-10-CM | POA: Diagnosis not present

## 2014-04-09 DIAGNOSIS — N186 End stage renal disease: Secondary | ICD-10-CM | POA: Diagnosis not present

## 2014-04-09 DIAGNOSIS — R627 Adult failure to thrive: Secondary | ICD-10-CM | POA: Diagnosis not present

## 2014-04-09 DIAGNOSIS — D689 Coagulation defect, unspecified: Secondary | ICD-10-CM | POA: Diagnosis not present

## 2014-04-10 DIAGNOSIS — R627 Adult failure to thrive: Secondary | ICD-10-CM | POA: Diagnosis not present

## 2014-04-11 DIAGNOSIS — D631 Anemia in chronic kidney disease: Secondary | ICD-10-CM | POA: Diagnosis not present

## 2014-04-11 DIAGNOSIS — D509 Iron deficiency anemia, unspecified: Secondary | ICD-10-CM | POA: Diagnosis not present

## 2014-04-11 DIAGNOSIS — D689 Coagulation defect, unspecified: Secondary | ICD-10-CM | POA: Diagnosis not present

## 2014-04-11 DIAGNOSIS — N186 End stage renal disease: Secondary | ICD-10-CM | POA: Diagnosis not present

## 2014-04-12 DIAGNOSIS — R51 Headache: Secondary | ICD-10-CM | POA: Diagnosis not present

## 2014-04-12 DIAGNOSIS — F329 Major depressive disorder, single episode, unspecified: Secondary | ICD-10-CM | POA: Diagnosis not present

## 2014-04-12 DIAGNOSIS — R634 Abnormal weight loss: Secondary | ICD-10-CM | POA: Diagnosis not present

## 2014-04-12 DIAGNOSIS — S0181XD Laceration without foreign body of other part of head, subsequent encounter: Secondary | ICD-10-CM | POA: Diagnosis not present

## 2014-04-13 DIAGNOSIS — D509 Iron deficiency anemia, unspecified: Secondary | ICD-10-CM | POA: Diagnosis not present

## 2014-04-13 DIAGNOSIS — N186 End stage renal disease: Secondary | ICD-10-CM | POA: Diagnosis not present

## 2014-04-13 DIAGNOSIS — D631 Anemia in chronic kidney disease: Secondary | ICD-10-CM | POA: Diagnosis not present

## 2014-04-13 DIAGNOSIS — D689 Coagulation defect, unspecified: Secondary | ICD-10-CM | POA: Diagnosis not present

## 2014-04-15 DIAGNOSIS — R634 Abnormal weight loss: Secondary | ICD-10-CM | POA: Diagnosis not present

## 2014-04-15 DIAGNOSIS — N186 End stage renal disease: Secondary | ICD-10-CM | POA: Diagnosis not present

## 2014-04-15 DIAGNOSIS — I509 Heart failure, unspecified: Secondary | ICD-10-CM | POA: Diagnosis not present

## 2014-04-15 DIAGNOSIS — F329 Major depressive disorder, single episode, unspecified: Secondary | ICD-10-CM | POA: Diagnosis not present

## 2014-04-16 DIAGNOSIS — N186 End stage renal disease: Secondary | ICD-10-CM | POA: Diagnosis not present

## 2014-04-16 DIAGNOSIS — D689 Coagulation defect, unspecified: Secondary | ICD-10-CM | POA: Diagnosis not present

## 2014-04-16 DIAGNOSIS — R51 Headache: Secondary | ICD-10-CM | POA: Diagnosis not present

## 2014-04-16 DIAGNOSIS — R627 Adult failure to thrive: Secondary | ICD-10-CM | POA: Diagnosis not present

## 2014-04-16 DIAGNOSIS — D509 Iron deficiency anemia, unspecified: Secondary | ICD-10-CM | POA: Diagnosis not present

## 2014-04-16 DIAGNOSIS — F341 Dysthymic disorder: Secondary | ICD-10-CM | POA: Diagnosis not present

## 2014-04-16 DIAGNOSIS — D631 Anemia in chronic kidney disease: Secondary | ICD-10-CM | POA: Diagnosis not present

## 2014-04-17 DIAGNOSIS — R627 Adult failure to thrive: Secondary | ICD-10-CM | POA: Diagnosis not present

## 2014-04-18 DIAGNOSIS — F329 Major depressive disorder, single episode, unspecified: Secondary | ICD-10-CM | POA: Diagnosis not present

## 2014-04-18 DIAGNOSIS — R634 Abnormal weight loss: Secondary | ICD-10-CM | POA: Diagnosis not present

## 2014-04-18 DIAGNOSIS — N186 End stage renal disease: Secondary | ICD-10-CM | POA: Diagnosis not present

## 2014-04-18 DIAGNOSIS — R51 Headache: Secondary | ICD-10-CM | POA: Diagnosis not present

## 2014-04-19 DIAGNOSIS — N186 End stage renal disease: Secondary | ICD-10-CM | POA: Diagnosis not present

## 2014-04-19 DIAGNOSIS — R569 Unspecified convulsions: Secondary | ICD-10-CM | POA: Diagnosis not present

## 2014-04-19 DIAGNOSIS — F0390 Unspecified dementia without behavioral disturbance: Secondary | ICD-10-CM | POA: Diagnosis not present

## 2014-04-19 DIAGNOSIS — I10 Essential (primary) hypertension: Secondary | ICD-10-CM | POA: Diagnosis not present

## 2014-04-19 DIAGNOSIS — I5040 Unspecified combined systolic (congestive) and diastolic (congestive) heart failure: Secondary | ICD-10-CM | POA: Diagnosis not present

## 2014-04-20 DIAGNOSIS — D509 Iron deficiency anemia, unspecified: Secondary | ICD-10-CM | POA: Diagnosis not present

## 2014-04-20 DIAGNOSIS — D689 Coagulation defect, unspecified: Secondary | ICD-10-CM | POA: Diagnosis not present

## 2014-04-20 DIAGNOSIS — N186 End stage renal disease: Secondary | ICD-10-CM | POA: Diagnosis not present

## 2014-04-20 DIAGNOSIS — D631 Anemia in chronic kidney disease: Secondary | ICD-10-CM | POA: Diagnosis not present

## 2014-04-23 DIAGNOSIS — D689 Coagulation defect, unspecified: Secondary | ICD-10-CM | POA: Diagnosis not present

## 2014-04-23 DIAGNOSIS — D509 Iron deficiency anemia, unspecified: Secondary | ICD-10-CM | POA: Diagnosis not present

## 2014-04-23 DIAGNOSIS — D631 Anemia in chronic kidney disease: Secondary | ICD-10-CM | POA: Diagnosis not present

## 2014-04-23 DIAGNOSIS — N186 End stage renal disease: Secondary | ICD-10-CM | POA: Diagnosis not present

## 2014-04-24 DIAGNOSIS — R634 Abnormal weight loss: Secondary | ICD-10-CM | POA: Diagnosis not present

## 2014-04-24 DIAGNOSIS — N186 End stage renal disease: Secondary | ICD-10-CM | POA: Diagnosis not present

## 2014-04-24 DIAGNOSIS — I10 Essential (primary) hypertension: Secondary | ICD-10-CM | POA: Diagnosis not present

## 2014-04-24 DIAGNOSIS — I509 Heart failure, unspecified: Secondary | ICD-10-CM | POA: Diagnosis not present

## 2014-04-25 DIAGNOSIS — D631 Anemia in chronic kidney disease: Secondary | ICD-10-CM | POA: Diagnosis not present

## 2014-04-25 DIAGNOSIS — D689 Coagulation defect, unspecified: Secondary | ICD-10-CM | POA: Diagnosis not present

## 2014-04-25 DIAGNOSIS — D509 Iron deficiency anemia, unspecified: Secondary | ICD-10-CM | POA: Diagnosis not present

## 2014-04-25 DIAGNOSIS — N186 End stage renal disease: Secondary | ICD-10-CM | POA: Diagnosis not present

## 2014-04-27 DIAGNOSIS — D631 Anemia in chronic kidney disease: Secondary | ICD-10-CM | POA: Diagnosis not present

## 2014-04-27 DIAGNOSIS — D509 Iron deficiency anemia, unspecified: Secondary | ICD-10-CM | POA: Diagnosis not present

## 2014-04-27 DIAGNOSIS — D689 Coagulation defect, unspecified: Secondary | ICD-10-CM | POA: Diagnosis not present

## 2014-04-27 DIAGNOSIS — N186 End stage renal disease: Secondary | ICD-10-CM | POA: Diagnosis not present

## 2014-04-30 DIAGNOSIS — D631 Anemia in chronic kidney disease: Secondary | ICD-10-CM | POA: Diagnosis not present

## 2014-04-30 DIAGNOSIS — D509 Iron deficiency anemia, unspecified: Secondary | ICD-10-CM | POA: Diagnosis not present

## 2014-04-30 DIAGNOSIS — N186 End stage renal disease: Secondary | ICD-10-CM | POA: Diagnosis not present

## 2014-04-30 DIAGNOSIS — D689 Coagulation defect, unspecified: Secondary | ICD-10-CM | POA: Diagnosis not present

## 2014-05-02 DIAGNOSIS — D689 Coagulation defect, unspecified: Secondary | ICD-10-CM | POA: Diagnosis not present

## 2014-05-02 DIAGNOSIS — D631 Anemia in chronic kidney disease: Secondary | ICD-10-CM | POA: Diagnosis not present

## 2014-05-02 DIAGNOSIS — D509 Iron deficiency anemia, unspecified: Secondary | ICD-10-CM | POA: Diagnosis not present

## 2014-05-02 DIAGNOSIS — N186 End stage renal disease: Secondary | ICD-10-CM | POA: Diagnosis not present

## 2014-05-07 DIAGNOSIS — D509 Iron deficiency anemia, unspecified: Secondary | ICD-10-CM | POA: Diagnosis not present

## 2014-05-07 DIAGNOSIS — N186 End stage renal disease: Secondary | ICD-10-CM | POA: Diagnosis not present

## 2014-05-07 DIAGNOSIS — D631 Anemia in chronic kidney disease: Secondary | ICD-10-CM | POA: Diagnosis not present

## 2014-05-07 DIAGNOSIS — D689 Coagulation defect, unspecified: Secondary | ICD-10-CM | POA: Diagnosis not present

## 2014-05-09 DIAGNOSIS — D631 Anemia in chronic kidney disease: Secondary | ICD-10-CM | POA: Diagnosis not present

## 2014-05-09 DIAGNOSIS — D689 Coagulation defect, unspecified: Secondary | ICD-10-CM | POA: Diagnosis not present

## 2014-05-09 DIAGNOSIS — D509 Iron deficiency anemia, unspecified: Secondary | ICD-10-CM | POA: Diagnosis not present

## 2014-05-09 DIAGNOSIS — N186 End stage renal disease: Secondary | ICD-10-CM | POA: Diagnosis not present

## 2014-05-09 DIAGNOSIS — Z992 Dependence on renal dialysis: Secondary | ICD-10-CM | POA: Diagnosis not present

## 2014-05-09 DIAGNOSIS — I120 Hypertensive chronic kidney disease with stage 5 chronic kidney disease or end stage renal disease: Secondary | ICD-10-CM | POA: Diagnosis not present

## 2014-05-10 DIAGNOSIS — R1311 Dysphagia, oral phase: Secondary | ICD-10-CM | POA: Diagnosis not present

## 2014-05-11 DIAGNOSIS — D689 Coagulation defect, unspecified: Secondary | ICD-10-CM | POA: Diagnosis not present

## 2014-05-11 DIAGNOSIS — N186 End stage renal disease: Secondary | ICD-10-CM | POA: Diagnosis not present

## 2014-05-11 DIAGNOSIS — D509 Iron deficiency anemia, unspecified: Secondary | ICD-10-CM | POA: Diagnosis not present

## 2014-05-11 DIAGNOSIS — D631 Anemia in chronic kidney disease: Secondary | ICD-10-CM | POA: Diagnosis not present

## 2014-05-14 DIAGNOSIS — N186 End stage renal disease: Secondary | ICD-10-CM | POA: Diagnosis not present

## 2014-05-14 DIAGNOSIS — D689 Coagulation defect, unspecified: Secondary | ICD-10-CM | POA: Diagnosis not present

## 2014-05-14 DIAGNOSIS — D631 Anemia in chronic kidney disease: Secondary | ICD-10-CM | POA: Diagnosis not present

## 2014-05-14 DIAGNOSIS — D509 Iron deficiency anemia, unspecified: Secondary | ICD-10-CM | POA: Diagnosis not present

## 2014-05-16 DIAGNOSIS — D689 Coagulation defect, unspecified: Secondary | ICD-10-CM | POA: Diagnosis not present

## 2014-05-16 DIAGNOSIS — N186 End stage renal disease: Secondary | ICD-10-CM | POA: Diagnosis not present

## 2014-05-16 DIAGNOSIS — D631 Anemia in chronic kidney disease: Secondary | ICD-10-CM | POA: Diagnosis not present

## 2014-05-16 DIAGNOSIS — D509 Iron deficiency anemia, unspecified: Secondary | ICD-10-CM | POA: Diagnosis not present

## 2014-05-18 DIAGNOSIS — D689 Coagulation defect, unspecified: Secondary | ICD-10-CM | POA: Diagnosis not present

## 2014-05-18 DIAGNOSIS — N186 End stage renal disease: Secondary | ICD-10-CM | POA: Diagnosis not present

## 2014-05-18 DIAGNOSIS — D631 Anemia in chronic kidney disease: Secondary | ICD-10-CM | POA: Diagnosis not present

## 2014-05-18 DIAGNOSIS — D509 Iron deficiency anemia, unspecified: Secondary | ICD-10-CM | POA: Diagnosis not present

## 2014-05-21 DIAGNOSIS — D509 Iron deficiency anemia, unspecified: Secondary | ICD-10-CM | POA: Diagnosis not present

## 2014-05-21 DIAGNOSIS — N186 End stage renal disease: Secondary | ICD-10-CM | POA: Diagnosis not present

## 2014-05-21 DIAGNOSIS — D689 Coagulation defect, unspecified: Secondary | ICD-10-CM | POA: Diagnosis not present

## 2014-05-21 DIAGNOSIS — D631 Anemia in chronic kidney disease: Secondary | ICD-10-CM | POA: Diagnosis not present

## 2014-05-23 DIAGNOSIS — D689 Coagulation defect, unspecified: Secondary | ICD-10-CM | POA: Diagnosis not present

## 2014-05-23 DIAGNOSIS — D509 Iron deficiency anemia, unspecified: Secondary | ICD-10-CM | POA: Diagnosis not present

## 2014-05-23 DIAGNOSIS — N186 End stage renal disease: Secondary | ICD-10-CM | POA: Diagnosis not present

## 2014-05-23 DIAGNOSIS — D631 Anemia in chronic kidney disease: Secondary | ICD-10-CM | POA: Diagnosis not present

## 2014-05-25 DIAGNOSIS — N186 End stage renal disease: Secondary | ICD-10-CM | POA: Diagnosis not present

## 2014-05-25 DIAGNOSIS — D509 Iron deficiency anemia, unspecified: Secondary | ICD-10-CM | POA: Diagnosis not present

## 2014-05-25 DIAGNOSIS — D631 Anemia in chronic kidney disease: Secondary | ICD-10-CM | POA: Diagnosis not present

## 2014-05-25 DIAGNOSIS — D689 Coagulation defect, unspecified: Secondary | ICD-10-CM | POA: Diagnosis not present

## 2014-05-28 DIAGNOSIS — D631 Anemia in chronic kidney disease: Secondary | ICD-10-CM | POA: Diagnosis not present

## 2014-05-28 DIAGNOSIS — D689 Coagulation defect, unspecified: Secondary | ICD-10-CM | POA: Diagnosis not present

## 2014-05-28 DIAGNOSIS — D509 Iron deficiency anemia, unspecified: Secondary | ICD-10-CM | POA: Diagnosis not present

## 2014-05-28 DIAGNOSIS — N186 End stage renal disease: Secondary | ICD-10-CM | POA: Diagnosis not present

## 2014-05-30 DIAGNOSIS — D631 Anemia in chronic kidney disease: Secondary | ICD-10-CM | POA: Diagnosis not present

## 2014-05-30 DIAGNOSIS — D689 Coagulation defect, unspecified: Secondary | ICD-10-CM | POA: Diagnosis not present

## 2014-05-30 DIAGNOSIS — N186 End stage renal disease: Secondary | ICD-10-CM | POA: Diagnosis not present

## 2014-05-30 DIAGNOSIS — I120 Hypertensive chronic kidney disease with stage 5 chronic kidney disease or end stage renal disease: Secondary | ICD-10-CM | POA: Diagnosis not present

## 2014-05-30 DIAGNOSIS — D509 Iron deficiency anemia, unspecified: Secondary | ICD-10-CM | POA: Diagnosis not present

## 2014-06-03 DIAGNOSIS — R634 Abnormal weight loss: Secondary | ICD-10-CM | POA: Diagnosis not present

## 2014-06-03 DIAGNOSIS — I509 Heart failure, unspecified: Secondary | ICD-10-CM | POA: Diagnosis not present

## 2014-06-03 DIAGNOSIS — F329 Major depressive disorder, single episode, unspecified: Secondary | ICD-10-CM | POA: Diagnosis not present

## 2014-06-03 DIAGNOSIS — N186 End stage renal disease: Secondary | ICD-10-CM | POA: Diagnosis not present

## 2014-07-10 DEATH — deceased

## 2015-06-29 IMAGING — CT CT HEAD W/O CM
1 series · 16 of 30 positions shown, 20 images · non-contrast
Comparison: 07/08/2012

CLINICAL DATA: Head trauma after falling getting out of bed this
morning. Right frontal scalp hematoma.

EXAM:
CT HEAD WITHOUT CONTRAST
TECHNIQUE: Contiguous axial images were obtained from the base of the skull
through the vertex without intravenous contrast.

[Series 3: head 5.0 h30s · axial · 0.47mm/px · z∈[-134,+1]mm · 16 of 31 slices shown, 20 images]
[im 2/31  brain]
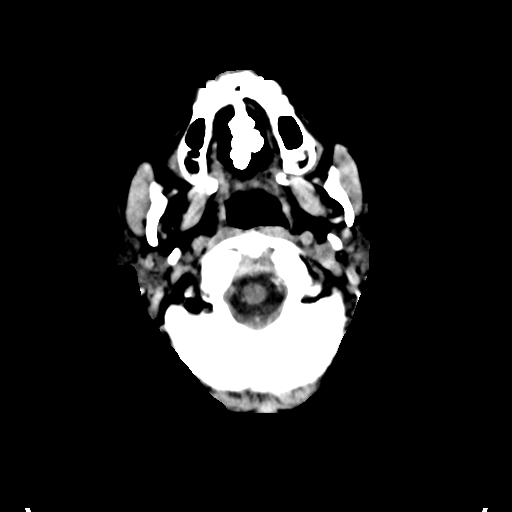
[im 2/31  bone]
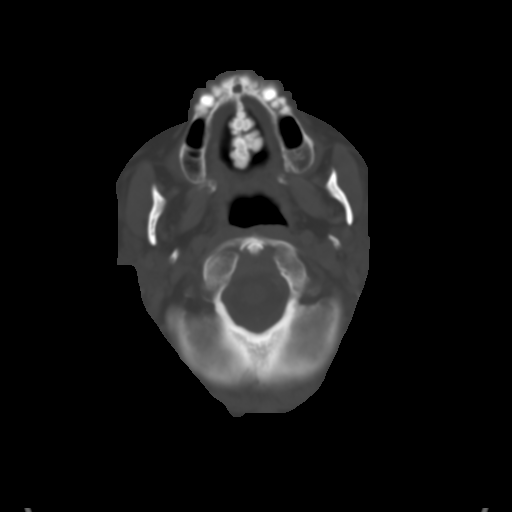
[im 4/31  brain]
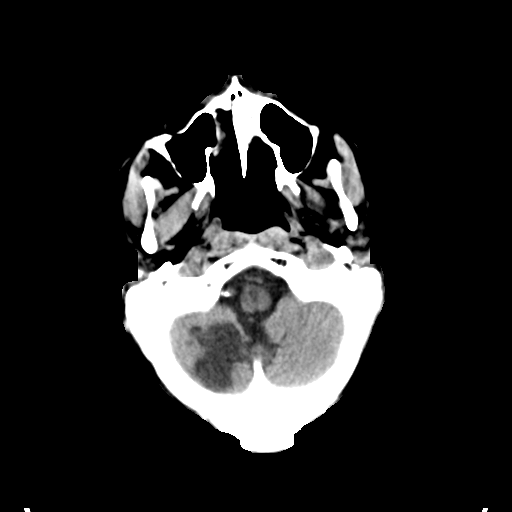
[im 6/31  brain]
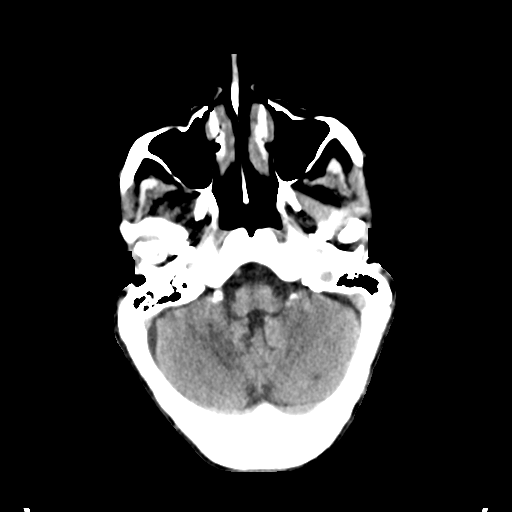
[im 8/31  brain]
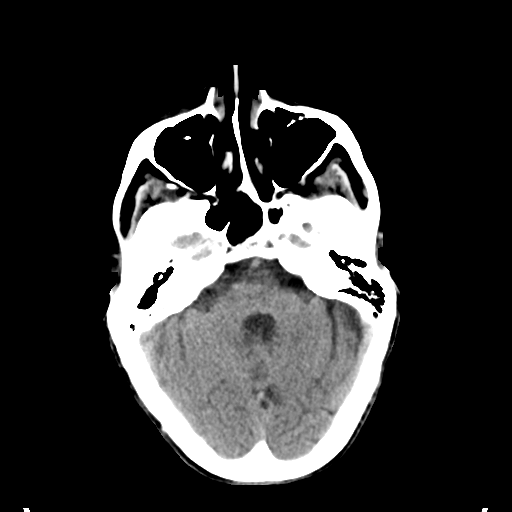
[im 9/31  brain]
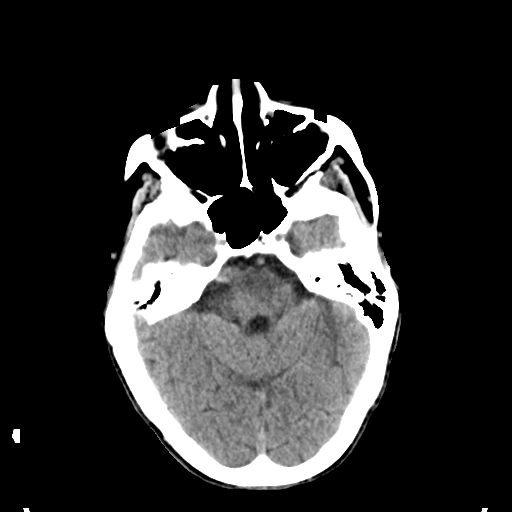
[im 9/31  bone]
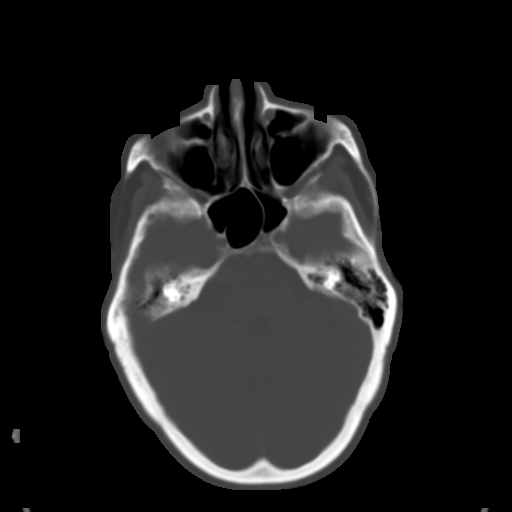
[im 11/31  brain]
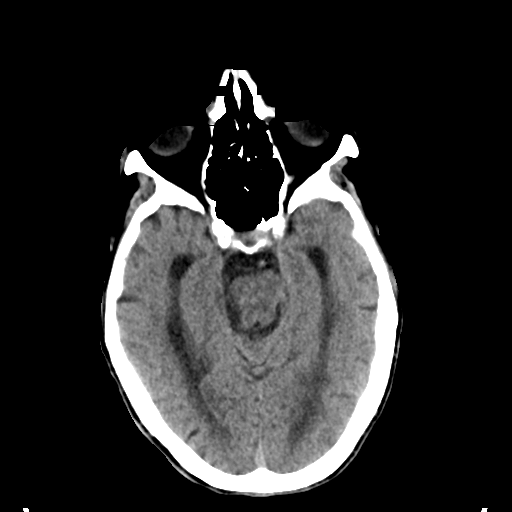
[im 13/31  brain]
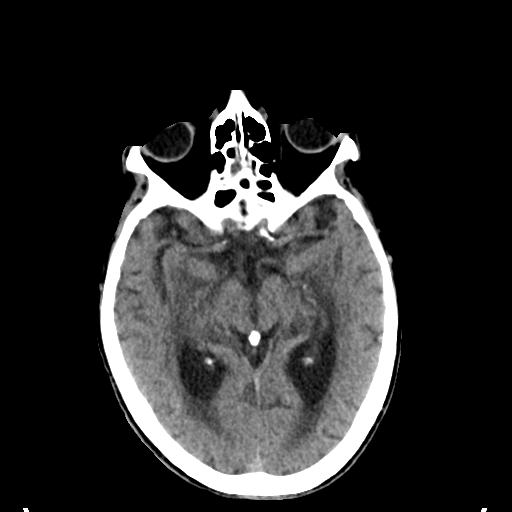
[im 15/31  brain]
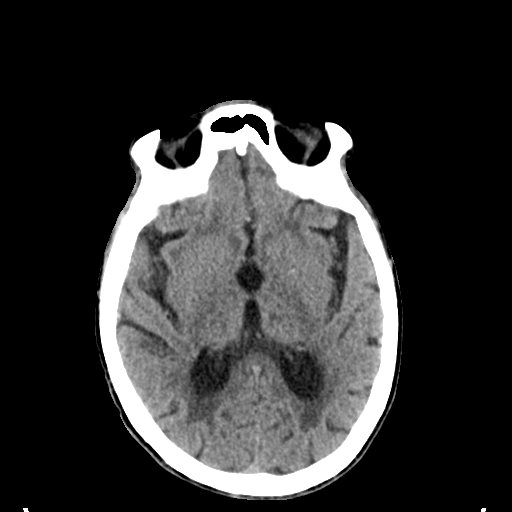
[im 16/31  brain]
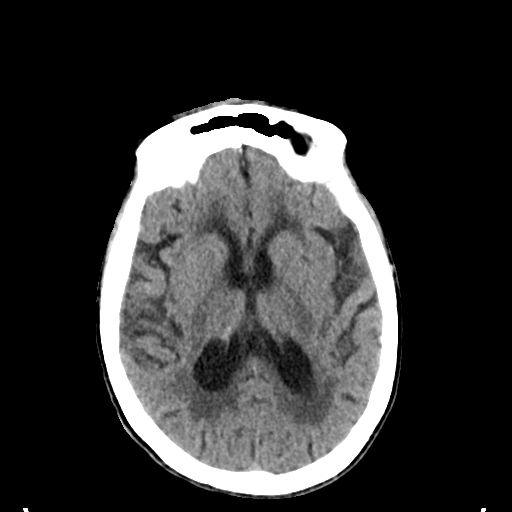
[im 16/31  bone]
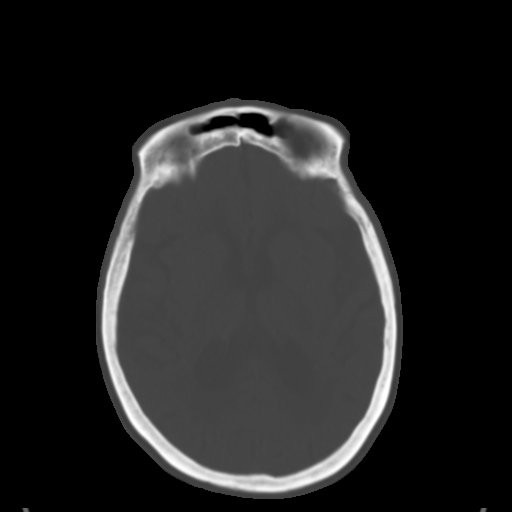
[im 18/31  brain]
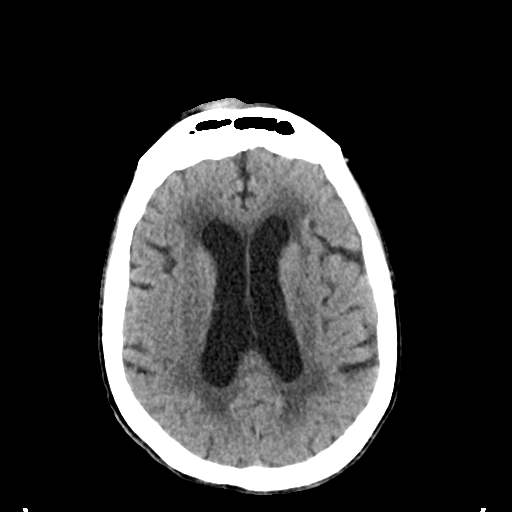
[im 20/31  brain]
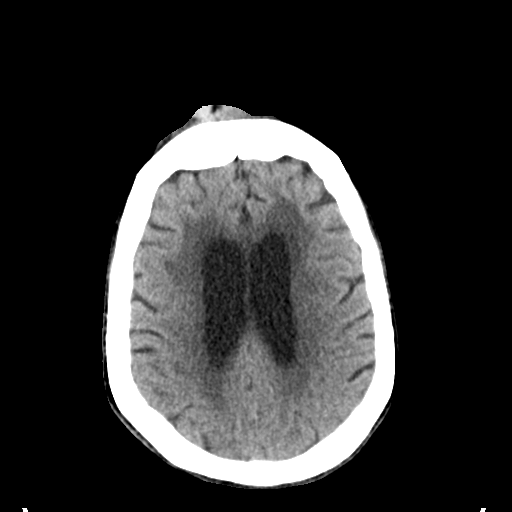
[im 22/31  brain]
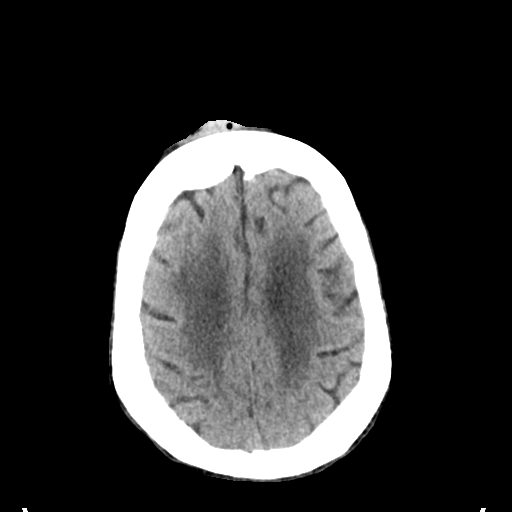
[im 23/31  brain]
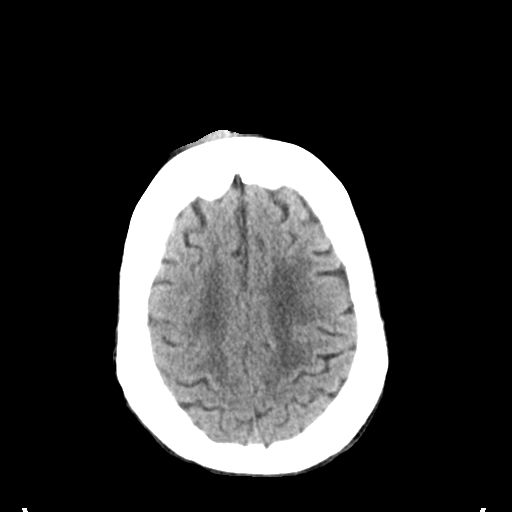
[im 23/31  bone]
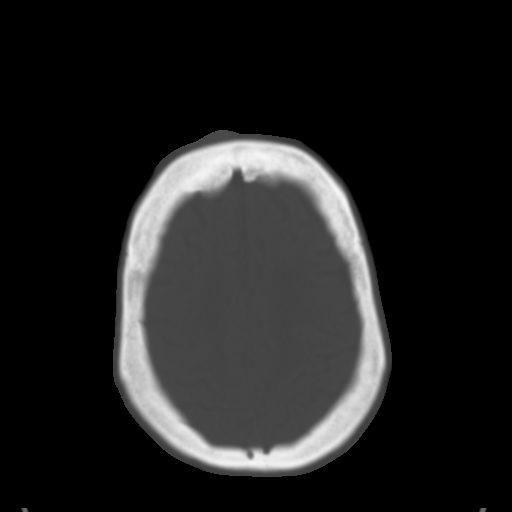
[im 25/31  brain]
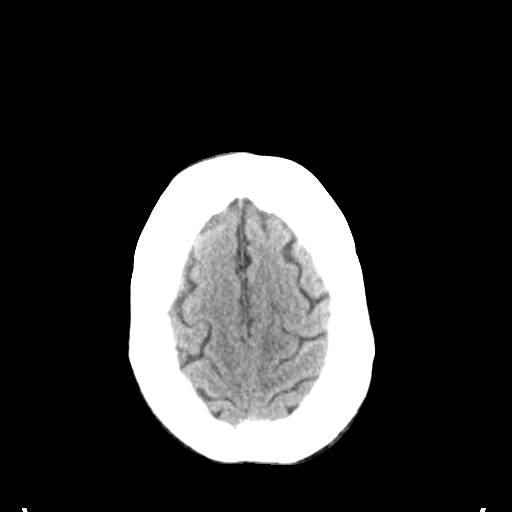
[im 27/31  brain]
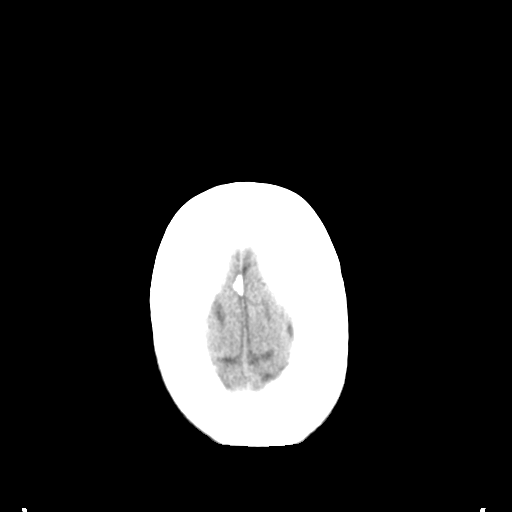
[im 29/31  brain]
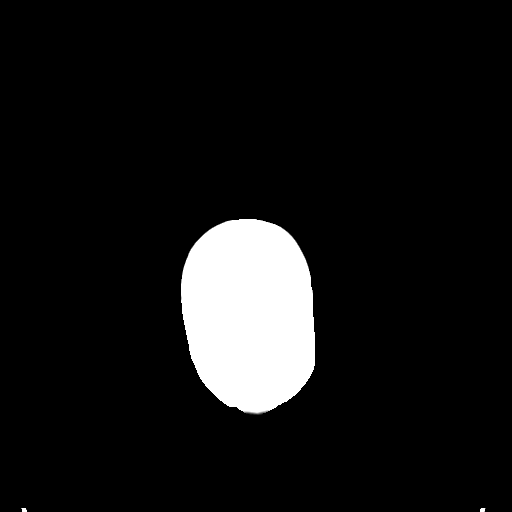

[16 of 30 positions shown; findings below may reference images not displayed]

FINDINGS: There is no acute intracranial hemorrhage, acute infarction, or
intracranial mass lesion.

There is an old right posterior inferior cerebellar artery
distribution infarct with secondary encephalomalacia. There is
diffuse cerebral cortical atrophy with secondary ventricular
dilatation, increased since the prior exam. There is increased
diffuse periventricular white matter lucency consistent with chronic
small vessel ischemic disease.

There is a prominent right frontal scalp hematoma. No underlying
osseous abnormality.
IMPRESSION: Prominent right frontal scalp hematoma. No acute intracranial
abnormality. Diffuse atrophy with chronic small vessel ischemic
disease.

Old right PICA infarct.
